# Patient Record
Sex: Female | Born: 1965
Health system: Southern US, Community
[De-identification: ages and names within clinical notes are randomized; demographics above are authoritative.]

## PROBLEM LIST (undated history)

## (undated) DIAGNOSIS — S82899A Other fracture of unspecified lower leg, initial encounter for closed fracture: Secondary | ICD-10-CM

## (undated) DIAGNOSIS — Z9089 Acquired absence of other organs: Secondary | ICD-10-CM

## (undated) DIAGNOSIS — F419 Anxiety disorder, unspecified: Secondary | ICD-10-CM

## (undated) DIAGNOSIS — H3552 Pigmentary retinal dystrophy: Secondary | ICD-10-CM

## (undated) DIAGNOSIS — K219 Gastro-esophageal reflux disease without esophagitis: Secondary | ICD-10-CM

## (undated) DIAGNOSIS — E039 Hypothyroidism, unspecified: Secondary | ICD-10-CM

## (undated) DIAGNOSIS — L719 Rosacea, unspecified: Secondary | ICD-10-CM

## (undated) DIAGNOSIS — R1310 Dysphagia, unspecified: Secondary | ICD-10-CM

## (undated) DIAGNOSIS — I341 Nonrheumatic mitral (valve) prolapse: Secondary | ICD-10-CM

## (undated) DIAGNOSIS — O039 Complete or unspecified spontaneous abortion without complication: Secondary | ICD-10-CM

## (undated) DIAGNOSIS — E049 Nontoxic goiter, unspecified: Secondary | ICD-10-CM

## (undated) DIAGNOSIS — F329 Major depressive disorder, single episode, unspecified: Secondary | ICD-10-CM

## (undated) DIAGNOSIS — I10 Essential (primary) hypertension: Secondary | ICD-10-CM

## (undated) DIAGNOSIS — F32A Depression, unspecified: Secondary | ICD-10-CM

## (undated) DIAGNOSIS — J302 Other seasonal allergic rhinitis: Secondary | ICD-10-CM

## (undated) HISTORY — PX: SEPTOPLASTY: SUR1290

## (undated) HISTORY — PX: WISDOM TOOTH EXTRACTION: SHX21

## (undated) HISTORY — DX: Other fracture of unspecified lower leg, initial encounter for closed fracture: S82.899A

## (undated) HISTORY — DX: Complete or unspecified spontaneous abortion without complication: O03.9

## (undated) HISTORY — PX: OTHER SURGICAL HISTORY: SHX169

## (undated) HISTORY — PX: DILATION AND CURETTAGE OF UTERUS: SHX78

## (undated) HISTORY — DX: Acquired absence of other organs: Z90.89

## (undated) HISTORY — PX: COLONOSCOPY: SHX174

## (undated) HISTORY — PX: TONSILLECTOMY: SUR1361

## (undated) HISTORY — PX: FRACTURE SURGERY: SHX138

## (undated) HISTORY — PX: ABDOMINAL HYSTERECTOMY: SHX81

---

## 2000-07-09 ENCOUNTER — Other Ambulatory Visit: Admission: RE | Admit: 2000-07-09 | Discharge: 2000-07-09 | Payer: Self-pay | Admitting: Family Medicine

## 2000-07-17 ENCOUNTER — Encounter: Payer: Self-pay | Admitting: Family Medicine

## 2000-07-17 ENCOUNTER — Encounter: Admission: RE | Admit: 2000-07-17 | Discharge: 2000-07-17 | Payer: Self-pay | Admitting: Family Medicine

## 2001-01-09 ENCOUNTER — Encounter: Payer: Self-pay | Admitting: Family Medicine

## 2001-01-09 ENCOUNTER — Encounter: Admission: RE | Admit: 2001-01-09 | Discharge: 2001-01-09 | Payer: Self-pay | Admitting: Family Medicine

## 2001-08-12 ENCOUNTER — Other Ambulatory Visit: Admission: RE | Admit: 2001-08-12 | Discharge: 2001-08-12 | Payer: Self-pay | Admitting: *Deleted

## 2002-10-15 ENCOUNTER — Other Ambulatory Visit: Admission: RE | Admit: 2002-10-15 | Discharge: 2002-10-15 | Payer: Self-pay | Admitting: Family Medicine

## 2003-11-25 ENCOUNTER — Other Ambulatory Visit: Admission: RE | Admit: 2003-11-25 | Discharge: 2003-11-25 | Payer: Self-pay | Admitting: Family Medicine

## 2004-09-21 ENCOUNTER — Encounter: Admission: RE | Admit: 2004-09-21 | Discharge: 2004-09-21 | Payer: Self-pay | Admitting: Family Medicine

## 2005-01-30 ENCOUNTER — Inpatient Hospital Stay (HOSPITAL_COMMUNITY): Admission: AD | Admit: 2005-01-30 | Discharge: 2005-01-30 | Payer: Self-pay | Admitting: Obstetrics & Gynecology

## 2005-03-10 ENCOUNTER — Encounter (INDEPENDENT_AMBULATORY_CARE_PROVIDER_SITE_OTHER): Payer: Self-pay | Admitting: Specialist

## 2005-03-10 ENCOUNTER — Ambulatory Visit (HOSPITAL_COMMUNITY): Admission: RE | Admit: 2005-03-10 | Discharge: 2005-03-10 | Payer: Self-pay | Admitting: *Deleted

## 2006-01-02 ENCOUNTER — Other Ambulatory Visit: Admission: RE | Admit: 2006-01-02 | Discharge: 2006-01-02 | Payer: Self-pay | Admitting: Gynecology

## 2006-06-07 ENCOUNTER — Ambulatory Visit: Payer: Self-pay | Admitting: Neonatology

## 2006-06-07 ENCOUNTER — Inpatient Hospital Stay (HOSPITAL_COMMUNITY): Admission: AD | Admit: 2006-06-07 | Discharge: 2006-06-10 | Payer: Self-pay | Admitting: Gynecology

## 2006-06-21 ENCOUNTER — Encounter: Admission: RE | Admit: 2006-06-21 | Discharge: 2006-07-14 | Payer: Self-pay | Admitting: Gynecology

## 2006-07-18 ENCOUNTER — Other Ambulatory Visit: Admission: RE | Admit: 2006-07-18 | Discharge: 2006-07-18 | Payer: Self-pay | Admitting: Gynecology

## 2009-02-19 ENCOUNTER — Other Ambulatory Visit: Admission: RE | Admit: 2009-02-19 | Discharge: 2009-02-19 | Payer: Self-pay | Admitting: Family Medicine

## 2010-01-21 ENCOUNTER — Emergency Department (HOSPITAL_COMMUNITY): Admission: EM | Admit: 2010-01-21 | Discharge: 2010-01-21 | Payer: Self-pay | Admitting: Emergency Medicine

## 2010-01-25 ENCOUNTER — Ambulatory Visit (HOSPITAL_BASED_OUTPATIENT_CLINIC_OR_DEPARTMENT_OTHER): Admission: RE | Admit: 2010-01-25 | Discharge: 2010-01-25 | Payer: Self-pay | Admitting: Orthopaedic Surgery

## 2010-05-23 ENCOUNTER — Encounter: Admission: RE | Admit: 2010-05-23 | Discharge: 2010-05-23 | Payer: Self-pay | Admitting: Family Medicine

## 2010-06-22 ENCOUNTER — Other Ambulatory Visit: Admission: RE | Admit: 2010-06-22 | Discharge: 2010-06-22 | Payer: Self-pay | Admitting: Interventional Radiology

## 2010-06-22 ENCOUNTER — Encounter: Admission: RE | Admit: 2010-06-22 | Discharge: 2010-06-22 | Payer: Self-pay | Admitting: Internal Medicine

## 2010-11-21 LAB — POCT HEMOGLOBIN-HEMACUE: Hemoglobin: 13.1 g/dL (ref 12.0–15.0)

## 2010-11-22 ENCOUNTER — Ambulatory Visit
Admission: RE | Admit: 2010-11-22 | Discharge: 2010-11-22 | Disposition: A | Discharge status: Home / Self Care | Payer: Medicare Other | Source: Ambulatory Visit | Attending: Internal Medicine | Admitting: Internal Medicine

## 2010-11-22 ENCOUNTER — Other Ambulatory Visit: Payer: Self-pay | Admitting: Internal Medicine

## 2010-11-22 DIAGNOSIS — R221 Localized swelling, mass and lump, neck: Secondary | ICD-10-CM

## 2011-01-19 ENCOUNTER — Other Ambulatory Visit (HOSPITAL_COMMUNITY): Payer: Self-pay | Admitting: Surgery

## 2011-01-19 DIAGNOSIS — R52 Pain, unspecified: Secondary | ICD-10-CM

## 2011-01-20 NOTE — Discharge Summary (Signed)
Alyssa Fisher, Alyssa Fisher                  ACCOUNT NO.:  [id number]   MEDICAL RECORD NO.:  [id number]          PATIENT TYPE:  INP   LOCATION:  9311                          [facility]ILITY:  [facility]   PHYSICIAN:  Timothy P. Fontaine, M.D.DATE OF BIRTH:  01/13/1966   DATE OF ADMISSION:  06/07/2006  DATE OF DISCHARGE:  06/10/2006                                 DISCHARGE SUMMARY   DISCHARGE DIAGNOSES:  1. Pregnancy at 33 weeks, delivered.  2. Intrauterine growth restriction.  3. Oligohydramnios.  4. Abnormal Doppler flow.  5. Labile blood pressure.  6. Questionable preterm rupture of the membranes.   PROCEDURE:  Spontaneous vaginal delivery.  Normal female infant.  Apgars 9 at  nine, June 08, 2006.  Dr. Reynaldo Minium.  Pathology pending.   HOSPITAL COURSE:  [age]-year-old G6, P4 female, 64 weeks' gestation.  Admitted with labile blood pressures, 130/90s, 140/80s.  symmetrical IUGR.  Oligohydramnios.  Questionable leaking for 1 week.  Abnormal Doppler flow  studies.  Cervical exam 3 cm dilated.  Bloody show.  Perinatology consult  obtained.  Agree with proceeding with the delivery at this time.  The  patient subsequently received Pitocin augmentation, beta strep antibody  prophylaxis.  Underwent vaginal delivery producing a normal-appearing female  infant.  Apgars 9 at nine.  The placenta was noted to be small with a true  knot noted within the umbilical cord.  She had no vaginal lacerations or  tears, nor episiotomy.  Her postpartum course was uncomplicated and she was  discharged on postpartum day number 2, ambulating well, tolerating a regular  diet, with a post-delivery hemoglobin of 11.8.  Patient's blood type is A-  positive and her rubella titer is positive.  Patient's blood pressures after  delivery were running in the 116 to 130/80 range.  She was given  precautions, instructions, and followup.  Will be seen in the office in 6  weeks following discharge.  Was given a prescription for Tylox  #20 one to 2  p.o. q.4 to 6 hours p.r.n. pain.      Timothy P. Fontaine, M.D.  Electronically Signed     TPF/MEDQ  D:  06/10/2006  T:  06/11/2006  Job:  [id number]

## 2011-01-20 NOTE — H&P (Signed)
Alyssa Fisher, GRALL                  ACCOUNT NO.:  [id number]   MEDICAL RECORD NO.:  [id number]          PATIENT TYPE:  INP   LOCATION:  9311                          [facility]   PHYSICIAN:  Juan H. Lily Peer, M.D.DATE OF BIRTH:  [birth year]   DATE OF ADMISSION:  DATE OF DISCHARGE:                                HISTORY & PHYSICAL   CHIEF COMPLAINT:  1. Pregnancy-induced hypertension.  2. Severe oligohydramnios.  3. Symmetrical IUGR.   HISTORY:  The patient is a 45 year old, gravida 6, para 4, AB 1, who  presented to the office today for followup visit as a result of the patient  having labile PIH, which started to be noticed at approximately 31 weeks'  gestation.  She had been closely monitored with weekly office visits.  Her  activity was restricted with modified activity and bed rest.  Today, her  blood pressures were 130/94, 142/84, 128/80.  Her fundal height measurement  was at approximately 30 cm and she is currently 33-1/2 weeks' gestation.  The patient underwent nonstress test and had a reassuring fetal heart rate  tracing.  A urinalysis was done, which demonstrated no evidence of  proteinuria.  Her CBC:  Her hemoglobin was 11, hematocrit 34.8, and her  platelet count was 167,000.  A PIH panel was done, which demonstrated normal  SGOT, SGPT, LDH, and uric acid.  She subsequently underwent an ultrasound  with a biophysical profile and Doppler flow studies.  Her biophysical  profile was recorded at 4/8 (2 for breathing and aminotic fluid volume of  2).  There was no tone or movement seen.  Doppler flow studies  systolic/diastolic ratios were normal at 3.08, normal is less than 3.70, and  her pulsatility index (MCA) was low at 0.98, normal being greater than 1.40.  Measurements demonstrated that symmetrical IUGR was evident with BPD at 29  weeks' gestation, head circumference at 32, abdominal circumference at 26,  and femur length at 27.  The fetus was in the vertex  presentation.  The  patient also had stated that over the past week she had felt that her  panties were moist and upon placement of a sterile speculum exam, there was  bloody show and her cervix was found to be 3 cm dilated.  Ferning was  difficult to assess due to the bloody show.  The patient will be admitted to  [facility].  We will proceed with a CST and plan on a vaginal  delivery/induction.  I have discussed the case with Dr. Margot Ables, [facility]  perinatologist, who was in agreement as well.  Prenatal course prior to that  point had been uneventful.  The patient had been offered prenatal screening  such as first trimester screen, maternal serum alpha fetoprotein and cystic  fibrosis, and/or amniocentesis, and she had declined.  She is requesting an  immediate postpartum tubal sterilization.  She does have history of mitral  valve prolapse.  Her weight gain since her first visit has been 26 pounds.   PAST MEDICAL HISTORY:  The patient is ALLERGIC TO CODEINE.  She  has had 4  vaginal deliveries in the past, all at 37 to 38 weeks with the except of her  first pregnancy she delivered at 36 weeks', and average weights have been  between 5 and 6 pounds.  The patient denied smoking or alcohol consumption.  She is currently on prenatal vitamins and has a history of retinitis  pigmentosum, and she has had a prior history of tonsillectomy.   REVIEW OF SYSTEMS:  See [place] form.   PHYSICAL EXAMINATION:  Well-developed, well-nourished female.  Blood pressure 130/94, 142/84, 128/80.  HEENT:  Unremarkable.  NECK:  Supple.  Trachea midline. No carotid bruits.  No thyromegaly.  LUNGS:  Clear to auscultation without rhonchi or wheezes.  HEART:  Regular rate and rhythm.  No murmurs or gallops.  BREASTS:  Not done.  ABDOMEN:  Fundal height 30+ cm, vertex.  EXTREMITIES:  DTR 2+ to 3+ with one-beat clonus and 1+ plus pitting edema.  PELVIC:  Cervix 3 cm, 80% effaced, -3, bloody show.    PRENATAL LABS:  A-positive blood type, negative antibody screen, VDRL was  nonreactive, rubella immune.  Hepatitis B surface antigen and HIV were  negative.  Diabetes screens have been normal.  The patient declined cystic  fibrosis screening, amniocentesis, first trimester screen, and alpha  fetoprotein screening.   ASSESSMENT:  A 45 year old gravida 6, para 4, AB 1 at 33-1/2 weeks'  estimated gestational age with evidence of symmetrical IUGR and severe  oligohydramnios as well as abnormal Doppler flow studies with a low MCA but  normal systolic/diastolic ratios.  Biophysical profile was 4/8 and the  nonstress test was reassuring.  Case discussed with Dr. Margot Ables,  perinatologist, who agreed with the above management.  In the event of any  fetal distress, we will proceed with an emergency cesarean section.  Patient  with history of mitral valve prolapse.  We will give also penicillin-G as  well for group-B streptococcus prophylaxis.  We had a lengthy discussion  with the above-mentioned findings that the fetus would be best served in an  extrauterine environment instead of  in utero environment despite being  premature.  The patient is fully aware that the baby needs to be placed  perhaps in the neonatal intensive care unit for resuscitation and treatment.  We may want to hold off on the tubal sterilization procedure until a later  time due to this being an emotional time with these recent findings and  decisions.  All of these issues were discussed in detail.  Dr. Margot Ables will  come by and speak with the patient.  I have also notified the neonatologist  on call, which will speak to her as well.   PLAN:  As per assessment above.      Juan H. Lily Peer, M.D.  Electronically Signed     JHF/MEDQ  D:  06/07/2006  T:  06/07/2006  Job:  [id number]

## 2011-01-20 NOTE — Consult Note (Signed)
Alyssa Fisher, BLACKSHER NO.:  [id number]   MEDICAL RECORD NO.:  [id number]          PATIENT TYPE:  MAT   LOCATION:  MATC                          [facility]  [facility]   PHYSICIAN:  Lenoard Aden, M.D.DATE OF BIRTH:  [birth year]   DATE OF CONSULTATION:  01/30/2005  DATE OF DISCHARGE:                                   CONSULTATION   CHIEF COMPLAINT:  First trimester bleeding.   HISTORY OF PRESENT ILLNESS:  The patient is a 45 year old white female, G5,  P3-1-0-4, at 5-1/[redacted] weeks gestation, who presents with bleeding post  intercourse.   PAST MEDICAL HISTORY:  Remarkable for four spontaneous vaginal deliveries.  No other medical or surgical hospitalizations.   MEDICATIONS:  Prenatal vitamins.   ALLERGIES:  She has no known drug allergies.   FAMILY HISTORY:  Noncontributory.   SOCIAL HISTORY:  Noncontributory.   PHYSICAL EXAMINATION:  VITAL SIGNS:  Within normal limits.  Pulse 100,  respirations 18, blood pressure 129/83.  HEENT:  Normal.  LUNGS:  Clear.  HEART:  Regular rate and rhythm.  ABDOMEN:  Soft, gravid and nontender.  PELVIC:  Cervix closed.  The uterus is appropriately sized.  Ultrasound  reveals a 5-1/2 week intrauterine gestation with fetal heart tones of 99  beats per minute.  Bilateral normal ovaries noted.  EXTREMITIES:  No cords.  NEUROLOGIC:  Exam is nonfocal.   IMPRESSION:  Threatened abortion at 5-1/2 weeks with fetal bradycardia.   PLAN:  Threatened AB warnings given.  Pelvic rest discussed.  Follow up in  the office in one week.  Repeat ultrasound at next visit.      RJT/MEDQ  D:  01/30/2005  T:  01/30/2005  Job:  [id number]   cc:   [facility]

## 2011-01-20 NOTE — Op Note (Signed)
Alyssa Fisher, Alyssa Fisher                   ACCOUNT NO.:  [id number]   MEDICAL RECORD NO.:  [id number]          PATIENT TYPE:  AMB   LOCATION:  SDC                           [facility]   PHYSICIAN:  [place] B. Earlene Plater, M.D.  DATE OF BIRTH:  November 03, 1965   DATE OF PROCEDURE:  03/10/2005  DATE OF DISCHARGE:                                 OPERATIVE REPORT   PREOPERATIVE DIAGNOSIS:  Nine week missed abortion.   POSTOPERATIVE DIAGNOSIS:  Nine week missed abortion.   PROCEDURE:  Suction curettage, ultrasound guided.   SURGEON:  Chester Holstein. Earlene Plater, M.D.   ASSISTANT:  None.   ANESTHESIA:  MAC and 20 mL 1% Nescaine paracervical block.   SPECIMENS:  Products of conception.   BLOOD LOSS:  Minimal.   COMPLICATIONS:  None.   INDICATIONS:  The patient is 11 weeks by dates.  No fetal heart tones on  routine visit 2 days ago.  Ultrasound showed nine-week missed abortion.  Desires operative management.  Risks discussed including infection,  bleeding, uterine perforation, damage to surrounding organs.  Alternative of  expectant management also discussed.   PROCEDURE IN DETAIL:  The patient taken to the operating room and MAC  anesthesia obtained.  She was placed in the Elliott stirrups and prepped and  draped in the standard fashion.  The bladder emptied with in-and-out.  Exam  under anesthesia showed a 9-10 week anteverted uterus.  The speculum  inserted, paracervical block placed.  The cervix was dilated to a #23.  The  #8 suction cannula was inserted.  With several passes, the uterus was  cleared, endometrium curetted, and a gritty texture noted.  Suction cannula  passed a second time and no additional products were returned.  Ultrasound  used and showed a thin endometrial stripe confirming complete evacuation.  The single tooth was removed and cervix hemostatic.  The patient tolerated  the procedure without complications.  She was taken to the recovery room in  stable condition.  She is noted to be  A positive from the office.       WBD/MEDQ  D:  03/10/2005  T:  03/10/2005  Job:  [id number]

## 2011-01-24 ENCOUNTER — Ambulatory Visit (HOSPITAL_COMMUNITY)
Admission: RE | Admit: 2011-01-24 | Discharge: 2011-01-24 | Disposition: A | Discharge status: Home / Self Care | Payer: Medicare Other | Source: Ambulatory Visit | Attending: Surgery | Admitting: Surgery

## 2011-01-24 ENCOUNTER — Other Ambulatory Visit (HOSPITAL_COMMUNITY): Payer: Self-pay | Admitting: Internal Medicine

## 2011-01-24 DIAGNOSIS — R52 Pain, unspecified: Secondary | ICD-10-CM

## 2011-01-24 DIAGNOSIS — R1319 Other dysphagia: Secondary | ICD-10-CM | POA: Insufficient documentation

## 2011-01-31 ENCOUNTER — Ambulatory Visit (INDEPENDENT_AMBULATORY_CARE_PROVIDER_SITE_OTHER): Payer: Medicare Other | Admitting: Internal Medicine

## 2011-01-31 DIAGNOSIS — R0609 Other forms of dyspnea: Secondary | ICD-10-CM

## 2011-01-31 DIAGNOSIS — R0989 Other specified symptoms and signs involving the circulatory and respiratory systems: Secondary | ICD-10-CM

## 2011-01-31 LAB — PULMONARY FUNCTION TEST

## 2011-01-31 NOTE — Progress Notes (Signed)
PFT done today. 

## 2011-02-07 ENCOUNTER — Encounter: Payer: Self-pay | Admitting: Internal Medicine

## 2011-02-20 ENCOUNTER — Encounter (INDEPENDENT_AMBULATORY_CARE_PROVIDER_SITE_OTHER): Payer: Self-pay | Admitting: Surgery

## 2011-02-20 DIAGNOSIS — R21 Rash and other nonspecific skin eruption: Secondary | ICD-10-CM

## 2011-02-20 DIAGNOSIS — E079 Disorder of thyroid, unspecified: Secondary | ICD-10-CM

## 2011-02-20 DIAGNOSIS — R42 Dizziness and giddiness: Secondary | ICD-10-CM

## 2011-02-20 DIAGNOSIS — R5383 Other fatigue: Secondary | ICD-10-CM

## 2011-02-20 DIAGNOSIS — R61 Generalized hyperhidrosis: Secondary | ICD-10-CM | POA: Insufficient documentation

## 2011-02-20 DIAGNOSIS — J349 Unspecified disorder of nose and nasal sinuses: Secondary | ICD-10-CM

## 2011-02-20 DIAGNOSIS — Z8489 Family history of other specified conditions: Secondary | ICD-10-CM

## 2011-02-20 DIAGNOSIS — Z973 Presence of spectacles and contact lenses: Secondary | ICD-10-CM | POA: Insufficient documentation

## 2011-02-20 DIAGNOSIS — R519 Headache, unspecified: Secondary | ICD-10-CM

## 2011-02-20 DIAGNOSIS — I341 Nonrheumatic mitral (valve) prolapse: Secondary | ICD-10-CM | POA: Insufficient documentation

## 2011-02-20 DIAGNOSIS — M1990 Unspecified osteoarthritis, unspecified site: Secondary | ICD-10-CM | POA: Insufficient documentation

## 2011-02-20 DIAGNOSIS — H3552 Pigmentary retinal dystrophy: Secondary | ICD-10-CM

## 2011-02-20 DIAGNOSIS — Z803 Family history of malignant neoplasm of breast: Secondary | ICD-10-CM | POA: Insufficient documentation

## 2011-02-20 DIAGNOSIS — R0989 Other specified symptoms and signs involving the circulatory and respiratory systems: Secondary | ICD-10-CM

## 2011-02-20 DIAGNOSIS — E042 Nontoxic multinodular goiter: Secondary | ICD-10-CM | POA: Insufficient documentation

## 2011-02-20 DIAGNOSIS — R490 Dysphonia: Secondary | ICD-10-CM

## 2011-02-20 HISTORY — DX: Family history of other specified conditions: Z84.89

## 2011-02-20 HISTORY — DX: Headache, unspecified: R51.9

## 2011-02-20 HISTORY — DX: Generalized hyperhidrosis: R61

## 2011-02-20 HISTORY — DX: Presence of spectacles and contact lenses: Z97.3

## 2011-02-20 HISTORY — DX: Dizziness and giddiness: R42

## 2011-02-20 HISTORY — DX: Rash and other nonspecific skin eruption: R21

## 2011-02-20 HISTORY — DX: Unspecified disorder of nose and nasal sinuses: J34.9

## 2011-02-20 HISTORY — DX: Other fatigue: R53.83

## 2011-02-20 HISTORY — DX: Dysphonia: R49.0

## 2011-02-20 HISTORY — DX: Other specified symptoms and signs involving the circulatory and respiratory systems: R09.89

## 2011-02-23 ENCOUNTER — Encounter (INDEPENDENT_AMBULATORY_CARE_PROVIDER_SITE_OTHER): Payer: Self-pay | Admitting: Surgery

## 2011-03-01 ENCOUNTER — Encounter (INDEPENDENT_AMBULATORY_CARE_PROVIDER_SITE_OTHER): Payer: Self-pay | Admitting: Surgery

## 2011-03-01 ENCOUNTER — Ambulatory Visit (INDEPENDENT_AMBULATORY_CARE_PROVIDER_SITE_OTHER): Payer: Medicare Other | Admitting: Surgery

## 2011-03-01 DIAGNOSIS — R1310 Dysphagia, unspecified: Secondary | ICD-10-CM | POA: Insufficient documentation

## 2011-03-01 DIAGNOSIS — E042 Nontoxic multinodular goiter: Secondary | ICD-10-CM

## 2011-03-01 NOTE — Progress Notes (Signed)
History: Patient returns for followup of thyroid nodules and review of results from speech pathology swallowing evaluation and pulmonary function test. Patient had a swallowing evaluation which showed hypertrophic upper esophagus consistent with gastroesophageal reflux as the etiology of her globus sensation and dysphagia. Pulmonary function test results are provided but there is no interpretation yet from the pulmonologist.  Pertinent review of systems: Patient noted slight improvement in her dysphagia with initiation of proton pump inhibitors, no change in dyspnea  Exam: HEENT: Normocephalic, sclerae clear, pupils equal and reactive, dentition good, mucous membranes moist, voiced normal Neck: Symmetric on anterior examination. Left thyroid nodule approximately 2 cm in size on palpation, mildly tender Chest: Clear clear to auscultation Cardiac: Regular rate and rhythm without murmur Extremities: No edema  Assessment: #1-bilateral thyroid nodules, less than 2 cm in maximum dimension, benign cytopathology #2-dysphagia likely secondary to gastroesophageal reflux #3-dyspnea with exercise of undetermined etiology  Plan: Patient will schedule followup with Dr. Talmage Coin the next few months. She will return to see me in 6 months with a repeat thyroid ultrasound agreement imaging prior to her office visit.  Patient will continue followup with her primary care provider regarding gastroesophageal reflux. She may require referral to gastroenterology if symptoms do not improve quickly.  I will contact Dr. Marcelyn Bruins regarding the pulmonary function test. I will ask him whether the patient requires pulmonary consultation.

## 2011-03-02 ENCOUNTER — Other Ambulatory Visit (INDEPENDENT_AMBULATORY_CARE_PROVIDER_SITE_OTHER): Payer: Self-pay | Admitting: Surgery

## 2011-03-02 DIAGNOSIS — E042 Nontoxic multinodular goiter: Secondary | ICD-10-CM

## 2011-04-13 ENCOUNTER — Telehealth (INDEPENDENT_AMBULATORY_CARE_PROVIDER_SITE_OTHER): Payer: Self-pay

## 2011-04-13 NOTE — Telephone Encounter (Signed)
Robbin: She will have to get PFT results from [place].  I do not know how to interpret them. Also, get a final report from them for me to review as well. Thanks, TMG

## 2011-04-13 NOTE — Telephone Encounter (Signed)
Patient called wanting an interpretation of PFT results. I told patient the request will be forward to Dr. Gerrit Friends.  However she may consider calling [place] where the test was performed.  If need more help she is welcome to call back. RMP

## 2011-04-16 IMAGING — CR DG ANKLE COMPLETE 3+V*L*
3 series · 3 of 3 positions shown · non-contrast
Comparison: None.

CLINICAL DATA: Foot injury status post fall.

LEFT ANKLE COMPLETE - 3+ VIEW

[t ankle joint ap left]
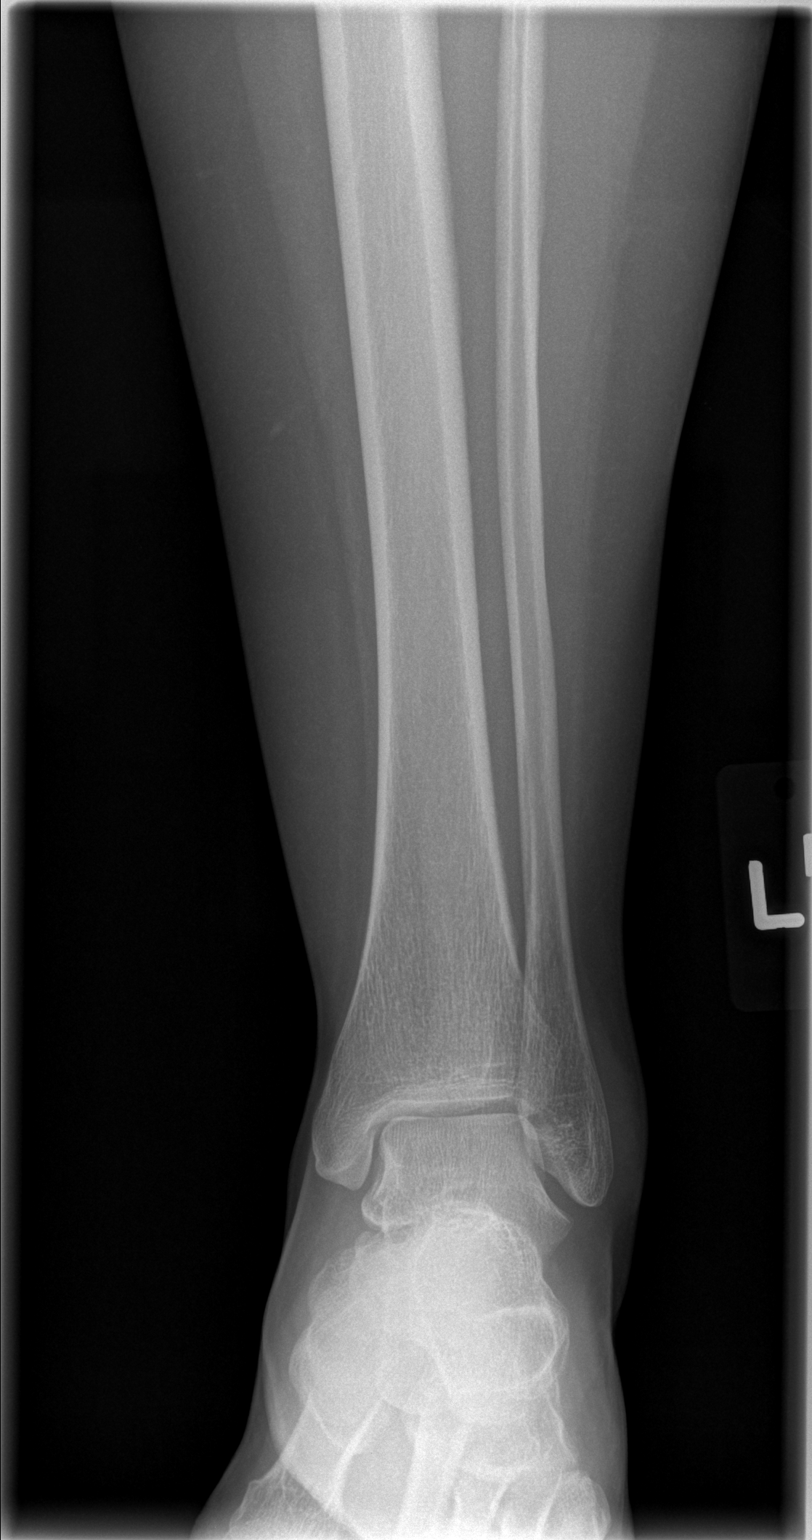

[t ankle joint oblique left]
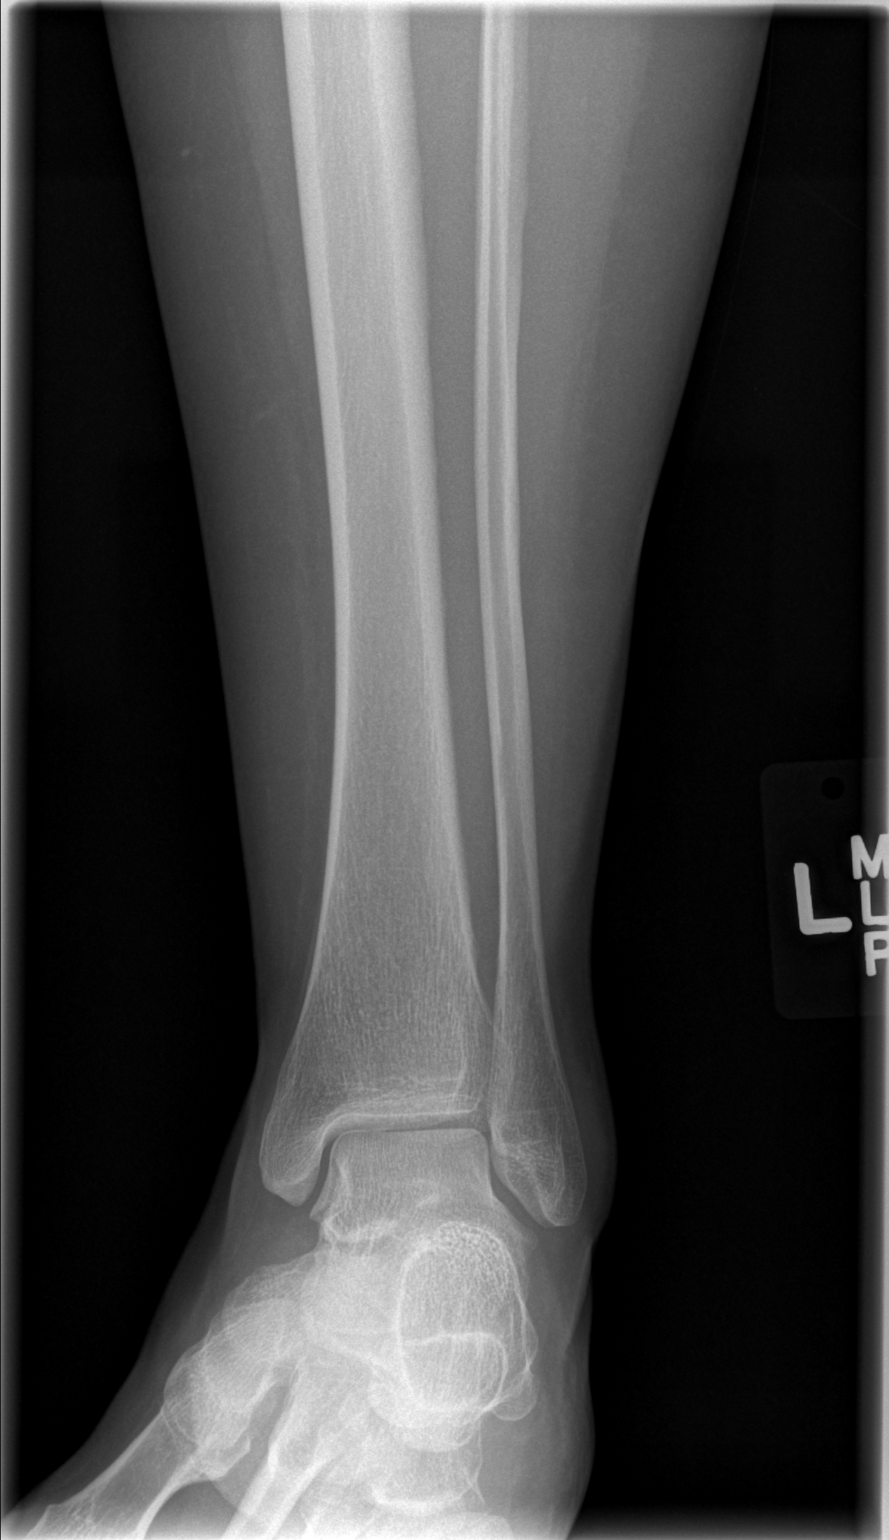

[view not recorded]
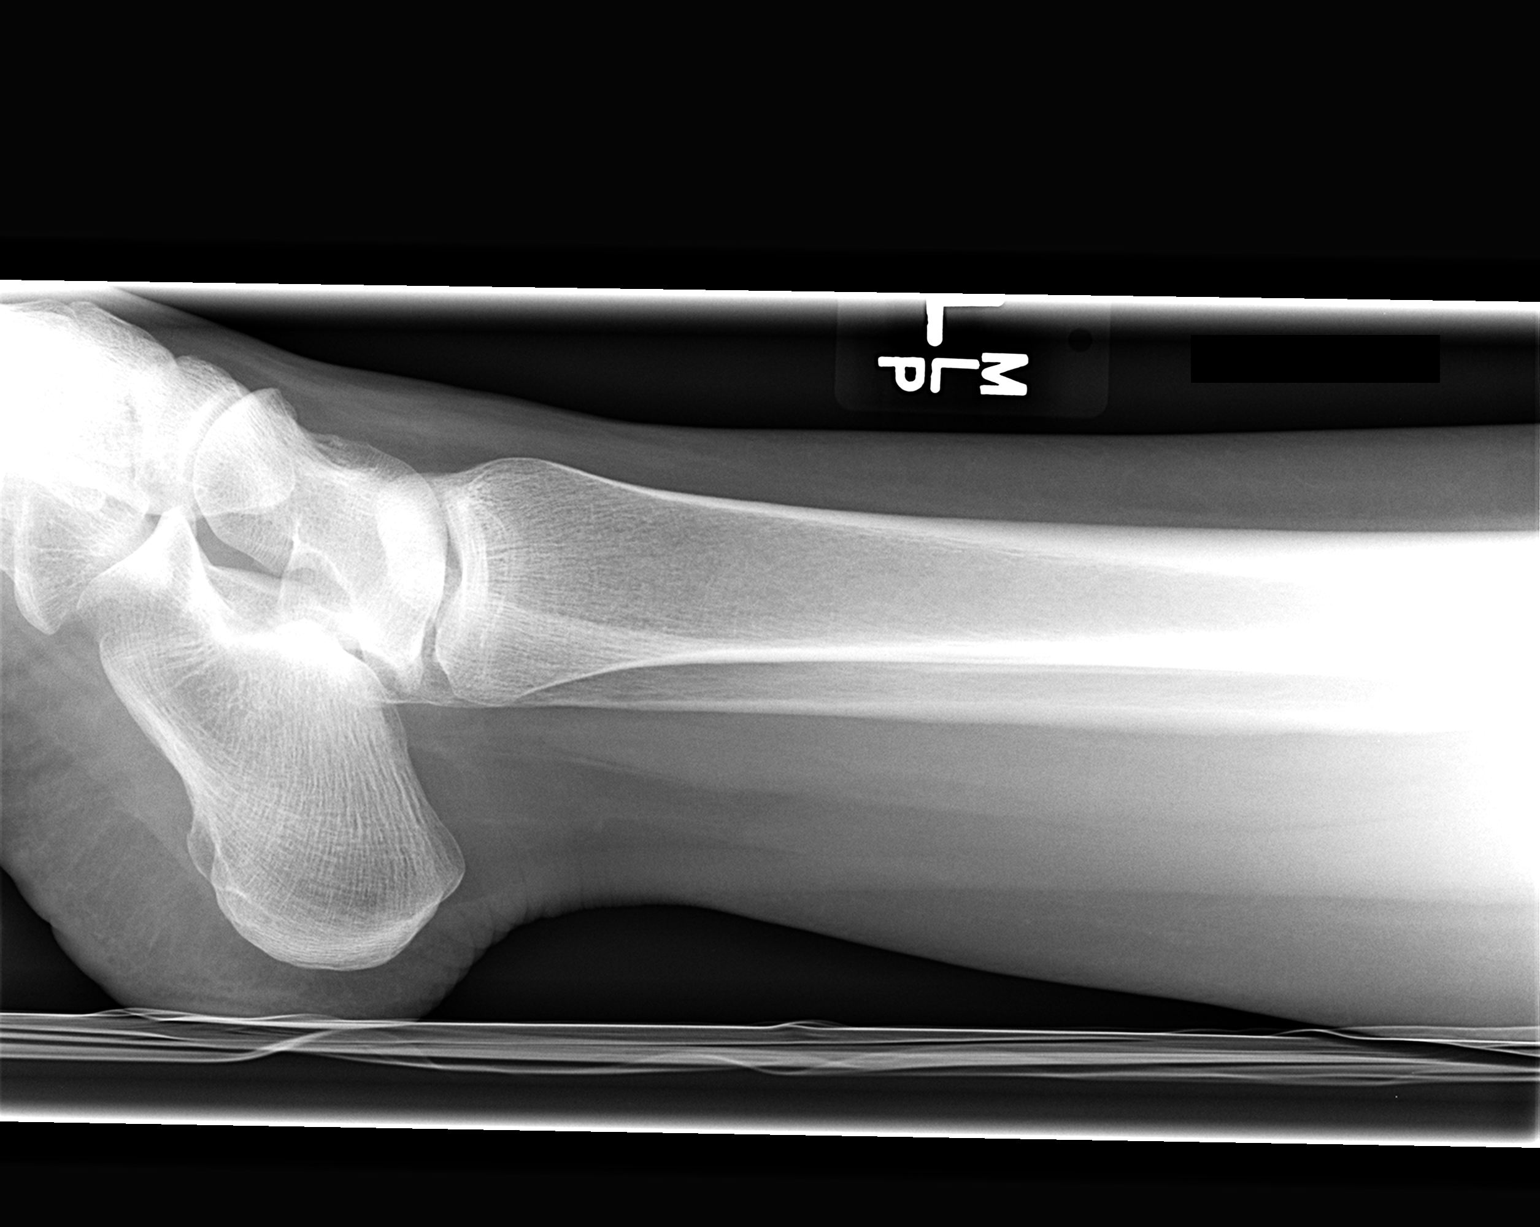

[3 of 3 positions shown; findings below may reference images not displayed]

FINDINGS: Mild soft tissue swelling lateral malleolar region.  No
underlying fracture or dislocation.
IMPRESSION: No fracture or dislocation.

Mild soft tissue swelling lateral malleolar region.

## 2011-04-19 ENCOUNTER — Telehealth: Payer: Self-pay | Admitting: Internal Medicine

## 2011-04-19 ENCOUNTER — Telehealth (INDEPENDENT_AMBULATORY_CARE_PROVIDER_SITE_OTHER): Payer: Self-pay | Admitting: General Surgery

## 2011-04-19 ENCOUNTER — Telehealth (INDEPENDENT_AMBULATORY_CARE_PROVIDER_SITE_OTHER): Payer: Self-pay

## 2011-04-19 ENCOUNTER — Telehealth: Payer: Self-pay | Admitting: Surgery

## 2011-04-19 NOTE — Telephone Encounter (Signed)
ATC Dr. Ardine Eng office to get fax # where she would like PFT's faxed to.  Office is closed for the day.  WCB tomorrow.

## 2011-04-19 NOTE — Telephone Encounter (Signed)
I called for PFT final summary to be faxed.  Alyssa Fisher has also requested a copy.  Message will be forward to Tammy at [place].

## 2011-04-19 NOTE — Telephone Encounter (Signed)
Error.  Triage took phone call.  Alyssa Fisher

## 2011-04-20 ENCOUNTER — Other Ambulatory Visit (INDEPENDENT_AMBULATORY_CARE_PROVIDER_SITE_OTHER): Payer: Self-pay | Admitting: Surgery

## 2011-04-20 DIAGNOSIS — E042 Nontoxic multinodular goiter: Secondary | ICD-10-CM

## 2011-04-20 NOTE — Telephone Encounter (Signed)
Faxed pft reuslts to the attention of robin the nurse at [facility]--per the front desk they did receive the fax and they will call the pt with the results since they ordered the test

## 2011-09-01 ENCOUNTER — Other Ambulatory Visit: Payer: Medicare Other

## 2011-09-01 ENCOUNTER — Ambulatory Visit
Admission: RE | Admit: 2011-09-01 | Discharge: 2011-09-01 | Disposition: A | Discharge status: Home / Self Care | Payer: Medicare Other | Source: Ambulatory Visit | Attending: Surgery | Admitting: Surgery

## 2011-09-01 DIAGNOSIS — E042 Nontoxic multinodular goiter: Secondary | ICD-10-CM

## 2011-09-15 IMAGING — US US BIOPSY
1 series · 14 of 18 positions shown · non-contrast
Comparison: none

Clinical Data/Indication: Bilateral thyroid nodules

Ultrasound-guided biopsyof bilateral thyroid nodules.  Fine needle
aspiration.
Procedure: The procedure, risks, benefits, and alternatives were
explained to the patient. Questions regarding the procedure were
encouraged and answered. The patient understands and consents to
the procedure.
The neck was prepped with betadine in a sterile fashion, and a
sterile drape was applied covering the operative field.
Under sonographic guidance, three 25 gauge fine needle aspirates of
the dominant right isthmic nodule were obtained.  Subsequently,
three per can through 25 gauge fine needle aspirates of the
dominant left isthmic nodule were obtained. Final imaging was
performed.

[Series 1: us biopsy · 0.07mm/px · 18 acquisitions, 14 frames shown]
[im 1/18]
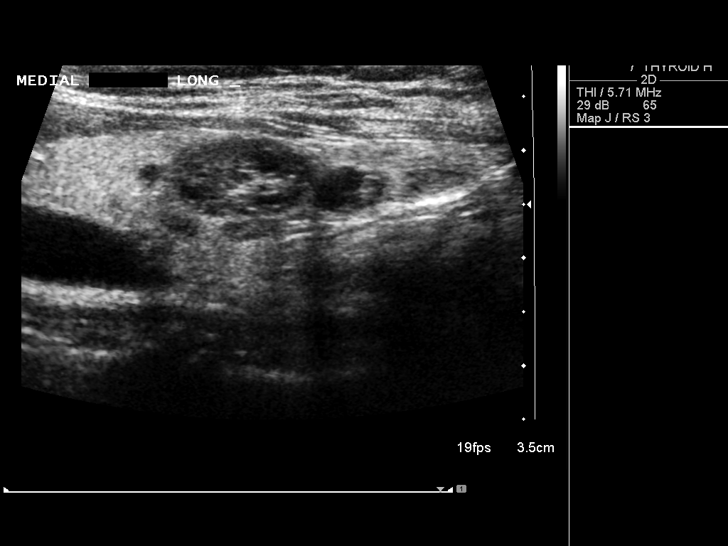
[im 2/18]
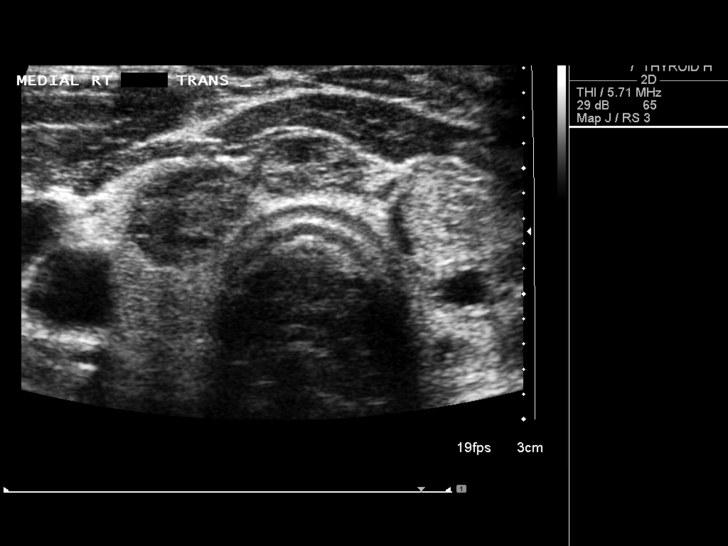
[im 4/18]
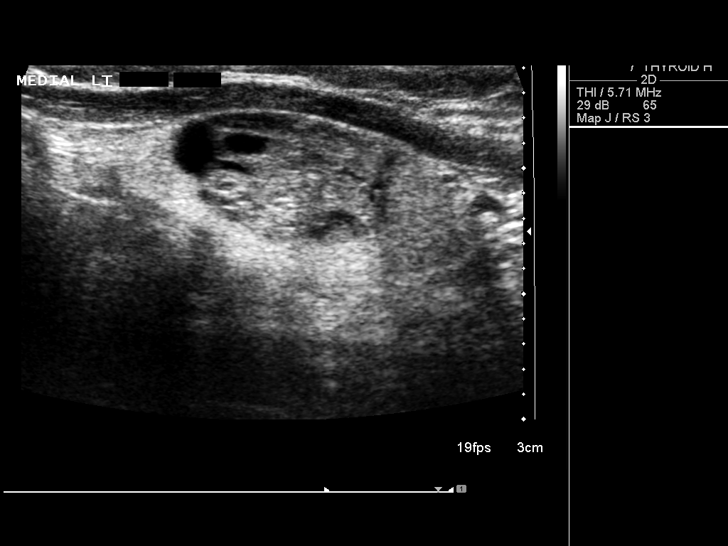
[im 5/18]
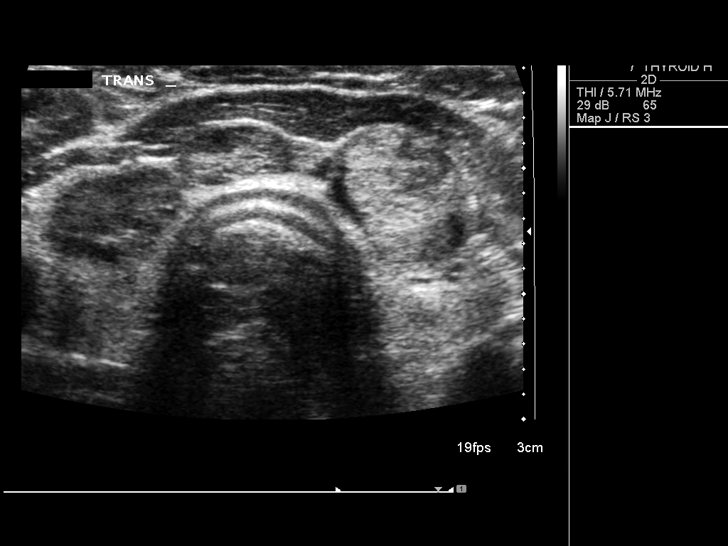
[im 6/18]
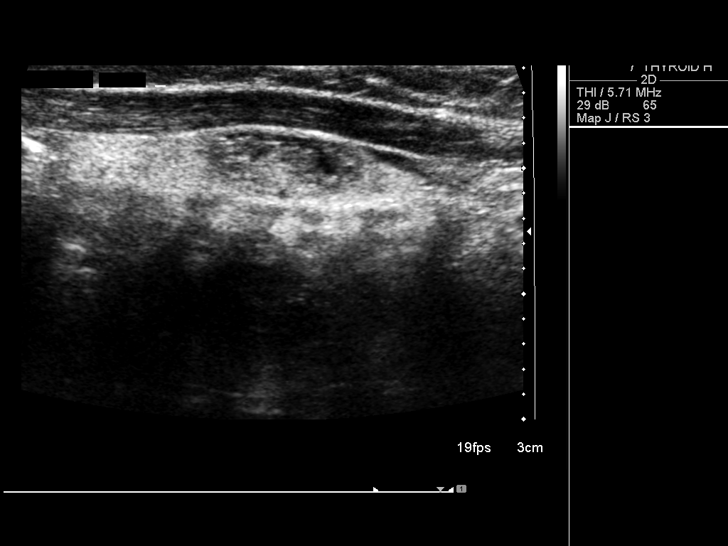
[im 8/18]
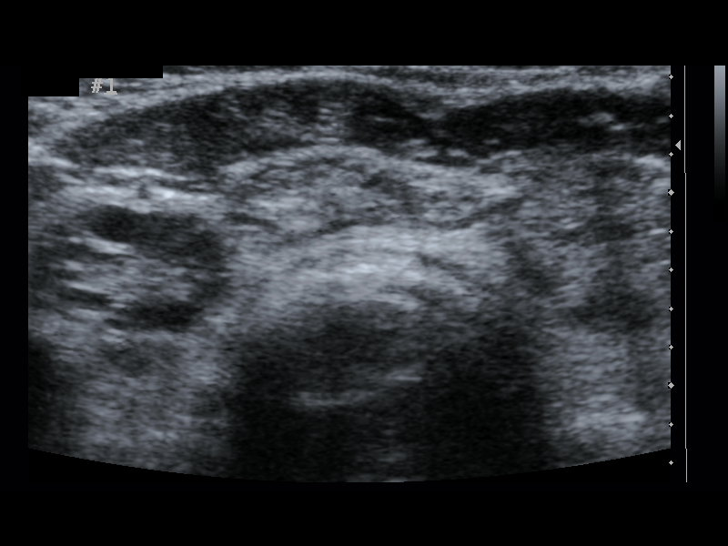
[im 9/18]
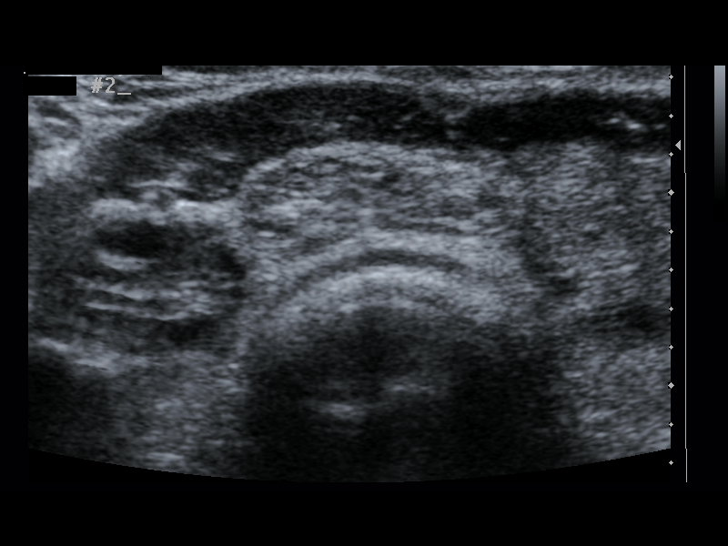
[im 10/18]
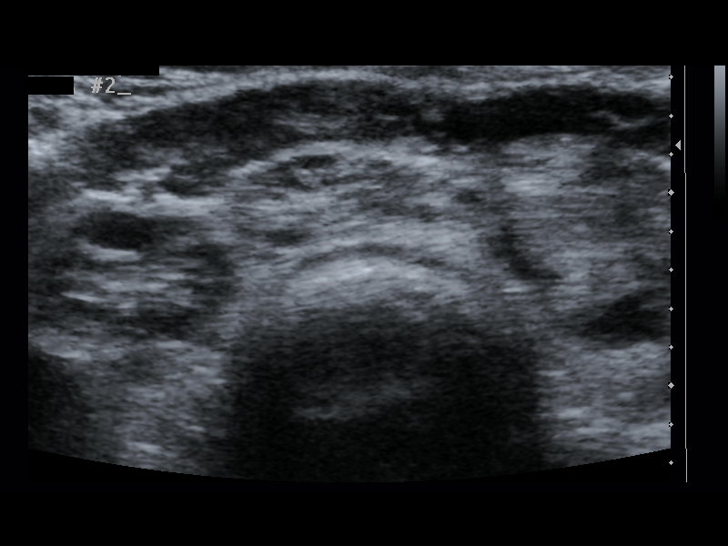
[im 11/18]
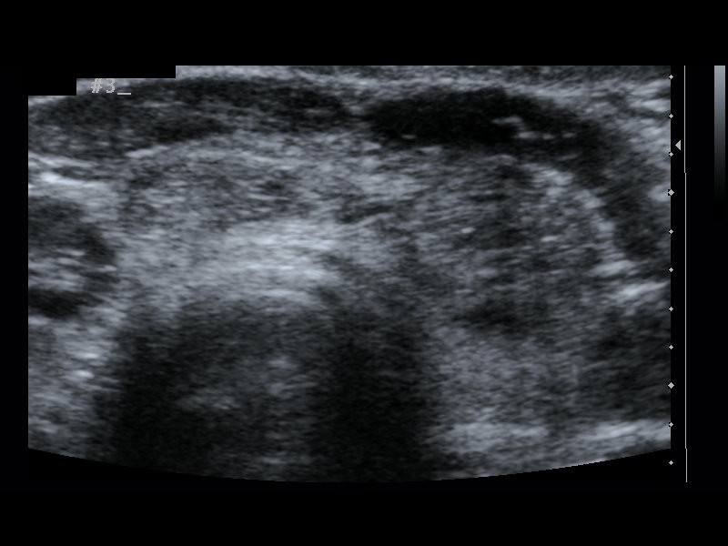
[im 13/18]
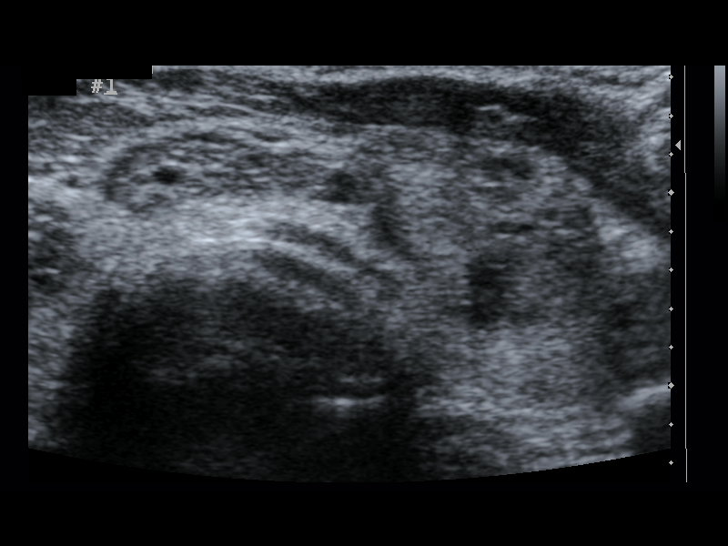
[im 14/18]
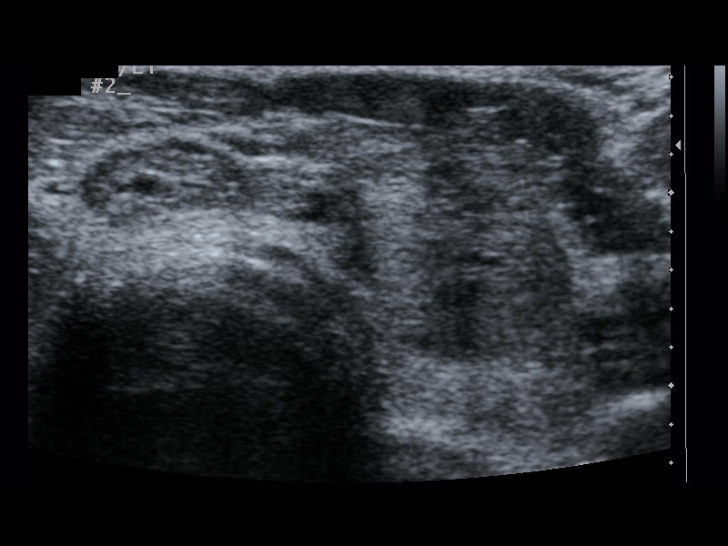
[im 15/18]
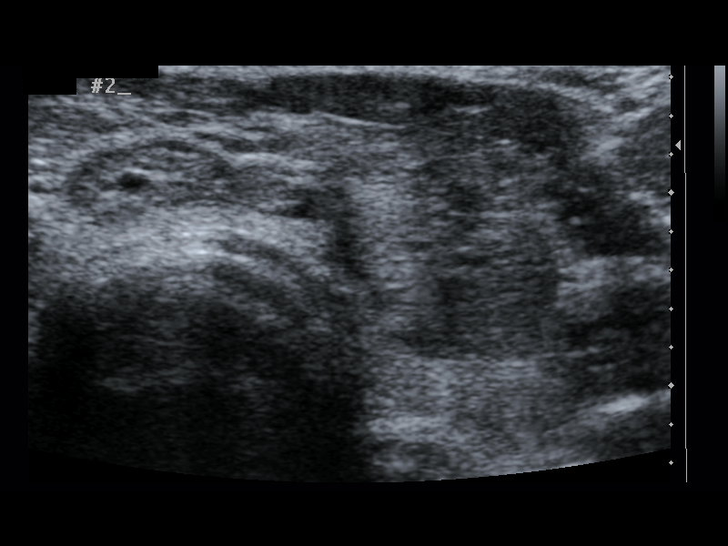
[im 17/18]
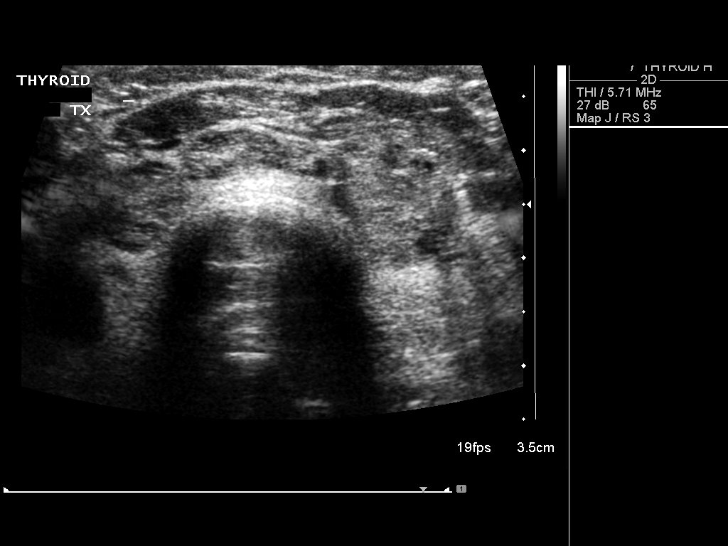
[im 18/18]
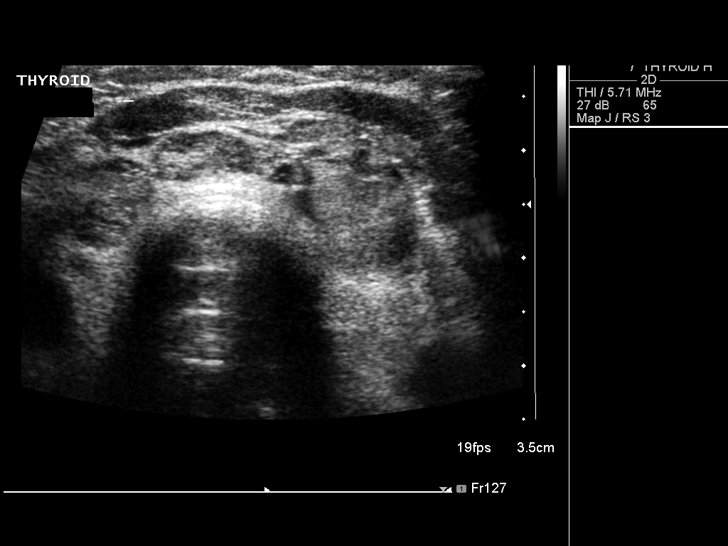

[14 of 18 positions shown; findings below may reference images not displayed]

FINDINGS: The images document guide needle placement within the
right and left isthmic thyroid nodules. Post biopsy images
demonstrate no hemorrhage.
IMPRESSION: Successful ultrasound-guided fine needle aspiration  biopsy of
bilateral isthmic thyroid nodules.

## 2011-09-26 ENCOUNTER — Encounter (INDEPENDENT_AMBULATORY_CARE_PROVIDER_SITE_OTHER): Payer: Self-pay | Admitting: Surgery

## 2011-09-28 ENCOUNTER — Ambulatory Visit (INDEPENDENT_AMBULATORY_CARE_PROVIDER_SITE_OTHER): Payer: Medicare Other | Admitting: Surgery

## 2011-09-28 ENCOUNTER — Encounter (INDEPENDENT_AMBULATORY_CARE_PROVIDER_SITE_OTHER): Payer: Self-pay | Admitting: Surgery

## 2011-09-28 VITALS — BP 102/84 | HR 100 | Temp 97.2°F | Resp 18 | Ht 65.0 in | Wt 179.0 lb

## 2011-09-28 DIAGNOSIS — E042 Nontoxic multinodular goiter: Secondary | ICD-10-CM

## 2011-09-28 NOTE — Progress Notes (Signed)
Visit Diagnoses: 1. Multiple thyroid nodules     HISTORY: Patient presents for followup with a history of bilateral thyroid nodules. Other issues which were present at her last visit have improved with treatment of her gastroesophageal reflux with proton pump inhibitors. She has been evaluated by ENT for dysphagia. Her dyspnea has improved.  At my request she underwent a followup thyroid ultrasound in late December 2012. This shows a relatively normal sized thyroid gland with multiple bilateral thyroid nodules. Largest nodule on the right measured 1.4 cm. The nodule in the isthmus measured 1.2 cm. Dominant nodule in the left lobe measured 1.9 cm. These were unchanged from her prior study in May 2012. No worrisome findings were identified.  Patient denies any new findings on self-examination. She denies any new complaints.  PERTINENT REVIEW OF SYSTEMS: Patient denies dysphagia. Denies dyspnea. No new masses or discomfort.  EXAM: HEENT: normocephalic; pupils equal and reactive; sclerae clear; dentition good; mucous membranes moist NECK:  Dominant soft nodule mid left lobe; asymmetric on extension; no palpable anterior or posterior cervical lymphadenopathy; no supraclavicular masses; no tenderness CHEST: clear to auscultation bilaterally without rales, rhonchi, or wheezes CARDIAC: regular rate and rhythm without significant murmur; peripheral pulses are full EXT:  non-tender without edema; no deformity NEURO: no gross focal deficits; no sign of tremor   IMPRESSION: Bilateral thyroid nodules, clinically stable  PLAN: The patient and I discussed all of the above findings. I provided her with a written copy of her thyroid ultrasound results. I have asked her to return in one year for followup physical examination and thyroid ultrasound.  Patient is seen her primary provider next month. I would asked that she have laboratory studies including a TSH level at that time and have a copy of those  results forwarded to my office for review.  I will see the patient back in one year.  Velora Heckler, MD, FACS General & Endocrine Surgery [facility]

## 2011-10-19 ENCOUNTER — Other Ambulatory Visit: Payer: Self-pay | Admitting: Physician Assistant

## 2011-10-19 ENCOUNTER — Other Ambulatory Visit (HOSPITAL_COMMUNITY)
Admission: RE | Admit: 2011-10-19 | Discharge: 2011-10-19 | Disposition: A | Discharge status: Home / Self Care | Payer: Medicare Other | Source: Ambulatory Visit | Attending: Family Medicine | Admitting: Family Medicine

## 2011-10-19 DIAGNOSIS — Z0000 Encounter for general adult medical examination without abnormal findings: Secondary | ICD-10-CM | POA: Insufficient documentation

## 2011-10-23 ENCOUNTER — Other Ambulatory Visit: Payer: Self-pay | Admitting: Family Medicine

## 2011-10-23 ENCOUNTER — Other Ambulatory Visit: Payer: Self-pay | Admitting: Physician Assistant

## 2011-10-23 DIAGNOSIS — Z1231 Encounter for screening mammogram for malignant neoplasm of breast: Secondary | ICD-10-CM

## 2011-10-27 ENCOUNTER — Encounter (INDEPENDENT_AMBULATORY_CARE_PROVIDER_SITE_OTHER): Payer: Self-pay

## 2011-11-08 ENCOUNTER — Ambulatory Visit
Admission: RE | Admit: 2011-11-08 | Discharge: 2011-11-08 | Disposition: A | Discharge status: Home / Self Care | Payer: Medicare Other | Source: Ambulatory Visit | Attending: Physician Assistant | Admitting: Physician Assistant

## 2011-11-08 DIAGNOSIS — Z1231 Encounter for screening mammogram for malignant neoplasm of breast: Secondary | ICD-10-CM

## 2011-12-14 DIAGNOSIS — H3552 Pigmentary retinal dystrophy: Secondary | ICD-10-CM | POA: Insufficient documentation

## 2012-09-10 ENCOUNTER — Telehealth (INDEPENDENT_AMBULATORY_CARE_PROVIDER_SITE_OTHER): Payer: Self-pay

## 2012-09-10 NOTE — Telephone Encounter (Signed)
LMOM phone # to [facility] for pt to call to set up u/s. Order is in epic.

## 2012-09-12 ENCOUNTER — Ambulatory Visit
Admission: RE | Admit: 2012-09-12 | Discharge: 2012-09-12 | Disposition: A | Discharge status: Home / Self Care | Payer: Medicare Other | Source: Ambulatory Visit | Attending: Surgery | Admitting: Surgery

## 2012-09-12 DIAGNOSIS — E042 Nontoxic multinodular goiter: Secondary | ICD-10-CM

## 2012-10-15 ENCOUNTER — Encounter (INDEPENDENT_AMBULATORY_CARE_PROVIDER_SITE_OTHER): Payer: Self-pay | Admitting: Surgery

## 2012-10-15 ENCOUNTER — Ambulatory Visit (INDEPENDENT_AMBULATORY_CARE_PROVIDER_SITE_OTHER): Payer: Medicare Other | Admitting: Surgery

## 2012-10-15 VITALS — BP 120/82 | HR 69 | Temp 97.7°F | Resp 18 | Ht 65.0 in | Wt 153.0 lb

## 2012-10-15 DIAGNOSIS — E042 Nontoxic multinodular goiter: Secondary | ICD-10-CM

## 2012-10-15 NOTE — Patient Instructions (Signed)

## 2012-10-15 NOTE — Progress Notes (Signed)
General Surgery - [facility]  Visit Diagnoses: 1. Multiple thyroid nodules     HISTORY: Patient is a 46-year-old white female followed for multiple bilateral thyroid nodules. At my request she underwent a followup ultrasound dated 09/12/2012. This shows interval enlargement of the right thyroid lobe now measuring 5.1 cm. Left lobe is essentially stable. There are multiple bilateral thyroid nodules. Most of these are stable. There is a dominant nodule in the right isthmus which is increased in size to 1.6 cm from 0.9 cm.  Patient is complaining of progressive compressive symptoms. She notes a globus sensation. She is having more problems with dysphagia at meals with both solids and liquids. She also complains of some neck tenderness.  Patient is interested in proceeding with thyroidectomy at this point in time.  PERTINENT REVIEW OF SYSTEMS: Progressive compressive symptoms as noted above. No tremor. No palpitations.  EXAM: HEENT: normocephalic; pupils equal and reactive; sclerae clear; dentition good; mucous membranes moist NECK:  Palpable nodules in the thyroid isthmus, mobile, mildly tender on the right; small nodules bilaterally in the mid thyroid lobes; symmetric on extension; no palpable anterior or posterior cervical lymphadenopathy; no supraclavicular masses; no tenderness CHEST: clear to auscultation bilaterally without rales, rhonchi, or wheezes CARDIAC: regular rate and rhythm without significant murmur; peripheral pulses are full EXT:  non-tender without edema; no deformity NEURO: no gross focal deficits; no sign of tremor   IMPRESSION: Multiple bilateral thyroid nodules, interval enlargement of right lobe and right isthmus nodule  PLAN: The patient and I had a lengthy discussion about the above findings. I explained to her that there was no absolute indication for thyroidectomy. However she does have relative indications for thyroidectomy with enlargement  of the right thyroid lobe, enlargement of one of her thyroid nodules, globus sensation, and progressive dysphagia. We discussed total thyroidectomy. We discussed the risk and benefits of the procedure including the risk of injury to recurrent laryngeal nerves and parathyroid glands. We discussed the need for lifelong thyroid hormone replacement therapy. We discussed the surgical incision to be anticipated. We discussed the hospital stay and her postoperative recovery. She understands and wishes to proceed. We will make arrangements for surgery at a time convenient for her in the near future.  The risks and benefits of the procedure have been discussed at length with the patient.  The patient understands the proposed procedure, potential alternative treatments, and the course of recovery to be expected.  All of the patient's questions have been answered at this time.  The patient wishes to proceed with surgery.  Baxter Gonzalez M. Oswald Pott, MD, FACS [facility] Office: [phone]    

## 2012-10-17 ENCOUNTER — Encounter (HOSPITAL_COMMUNITY): Payer: Self-pay | Admitting: Pharmacy Technician

## 2012-10-23 ENCOUNTER — Encounter (HOSPITAL_COMMUNITY): Payer: Self-pay

## 2012-10-23 ENCOUNTER — Encounter (HOSPITAL_COMMUNITY)
Admission: RE | Admit: 2012-10-23 | Discharge: 2012-10-23 | Disposition: A | Discharge status: Home / Self Care | Payer: Medicare Other | Source: Ambulatory Visit | Attending: Surgery | Admitting: Surgery

## 2012-10-23 ENCOUNTER — Ambulatory Visit (HOSPITAL_COMMUNITY)
Admission: RE | Admit: 2012-10-23 | Discharge: 2012-10-23 | Disposition: A | Discharge status: Home / Self Care | Payer: Medicare Other | Source: Ambulatory Visit | Attending: Surgery | Admitting: Surgery

## 2012-10-23 DIAGNOSIS — Z01818 Encounter for other preprocedural examination: Secondary | ICD-10-CM | POA: Insufficient documentation

## 2012-10-23 DIAGNOSIS — I341 Nonrheumatic mitral (valve) prolapse: Secondary | ICD-10-CM

## 2012-10-23 DIAGNOSIS — R1310 Dysphagia, unspecified: Secondary | ICD-10-CM

## 2012-10-23 DIAGNOSIS — Z01812 Encounter for preprocedural laboratory examination: Secondary | ICD-10-CM | POA: Insufficient documentation

## 2012-10-23 DIAGNOSIS — E049 Nontoxic goiter, unspecified: Secondary | ICD-10-CM

## 2012-10-23 DIAGNOSIS — H3552 Pigmentary retinal dystrophy: Secondary | ICD-10-CM

## 2012-10-23 HISTORY — DX: Pigmentary retinal dystrophy: H35.52

## 2012-10-23 HISTORY — DX: Nonrheumatic mitral (valve) prolapse: I34.1

## 2012-10-23 HISTORY — DX: Nontoxic goiter, unspecified: E04.9

## 2012-10-23 HISTORY — DX: Rosacea, unspecified: L71.9

## 2012-10-23 HISTORY — DX: Dysphagia, unspecified: R13.10

## 2012-10-23 LAB — HCG, SERUM, QUALITATIVE: Preg, Serum: NEGATIVE

## 2012-10-23 LAB — CBC
MCH: 28.6 pg (ref 26.0–34.0)
MCV: 86.8 fL (ref 78.0–100.0)
Platelets: 223 10*3/uL (ref 150–400)
RDW: 13.1 % (ref 11.5–15.5)
WBC: 5.5 10*3/uL (ref 4.0–10.5)

## 2012-10-23 NOTE — Progress Notes (Signed)
Quick Note:  These results are acceptable for scheduled surgery.  Daphney Hopke M. Stephanieann Popescu, MD, FACS [facility] Office: [phone]   ______ 

## 2012-10-23 NOTE — Pre-Procedure Instructions (Addendum)
10-23-12 CXR done  10-23-12 1545 Patient notified of Positive PCR screen for Staph aureus- will use Mupirocin Oint. As directed. W. Kennon Portela

## 2012-10-23 NOTE — Patient Instructions (Addendum)
20 Alyssa Fisher  10/23/2012   Your procedure is scheduled on:   10-29-2012  Report to [facility] at       0730 AM .  Call this number if you have problems the morning of surgery: [phone]  Or Presurgical Testing [phone](Jory Welke)     Do not eat food:After Midnight.      Take these medicines the morning of surgery with A SIP OF WATER: Omeprazole.   Do not wear jewelry, make-up or nail polish.  Do not wear lotions, powders, or perfumes. You may wear deodorant.  Do not shave 12 hours prior to first CHG shower(legs and under arms).(face and neck okay.)  Do not bring valuables to the hospital.  Contacts, dentures or bridgework,body piercing,  may not be worn into surgery.  Leave suitcase in the car. After surgery it may be brought to your room.  For patients admitted to the hospital, checkout time is 11:00 AM the day of discharge.   Patients discharged the day of surgery will not be allowed to drive home. Must have responsible person with you x 24 hours once discharged.  Name and phone number of your driver: David-spouse [phone] cell  Special Instructions: CHG Shower Use Special Wash: see special instructions.(avoid face and genitals)   Please read over the following fact sheets that you were given: MRSA Information.    Failure to follow these instructions may result in Cancellation of your surgery.   Patient signature_______________________________________________________

## 2012-10-29 ENCOUNTER — Encounter (HOSPITAL_COMMUNITY): Payer: Self-pay | Admitting: Anesthesiology

## 2012-10-29 ENCOUNTER — Ambulatory Visit (HOSPITAL_COMMUNITY): Payer: Medicare Other | Admitting: Anesthesiology

## 2012-10-29 ENCOUNTER — Encounter (HOSPITAL_COMMUNITY): Payer: Self-pay | Admitting: *Deleted

## 2012-10-29 ENCOUNTER — Encounter (HOSPITAL_COMMUNITY)
Admission: RE | Disposition: A | Discharge status: Home / Self Care | Payer: Self-pay | Source: Ambulatory Visit | Attending: Surgery

## 2012-10-29 ENCOUNTER — Observation Stay (HOSPITAL_COMMUNITY)
Admission: RE | Admit: 2012-10-29 | Discharge: 2012-10-30 | Disposition: A | Discharge status: Home / Self Care | Payer: Medicare Other | Source: Ambulatory Visit | Attending: Surgery | Admitting: Surgery

## 2012-10-29 DIAGNOSIS — E042 Nontoxic multinodular goiter: Principal | ICD-10-CM | POA: Insufficient documentation

## 2012-10-29 DIAGNOSIS — E041 Nontoxic single thyroid nodule: Secondary | ICD-10-CM

## 2012-10-29 HISTORY — PX: THYROIDECTOMY: SHX17

## 2012-10-29 SURGERY — THYROIDECTOMY
Anesthesia: General | Site: Throat | Wound class: Clean

## 2012-10-29 MED ORDER — DEXAMETHASONE SODIUM PHOSPHATE 10 MG/ML IJ SOLN
INTRAMUSCULAR | Status: DC | PRN
  Administered 2012-10-29: 10 mg via INTRAVENOUS

## 2012-10-29 MED ORDER — ACETAMINOPHEN 325 MG PO TABS: 650.0000 mg | ORAL_TABLET | ORAL | Status: DC | PRN

## 2012-10-29 MED ORDER — PANTOPRAZOLE SODIUM 40 MG PO TBEC
40.0000 mg | DELAYED_RELEASE_TABLET | Freq: Every day | ORAL | Status: DC
  Administered 2012-10-30: 40 mg via ORAL
  Filled 2012-10-29: qty 1

## 2012-10-29 MED ORDER — ROCURONIUM BROMIDE 100 MG/10ML IV SOLN
INTRAVENOUS | Status: DC | PRN
  Administered 2012-10-29: 35 mg via INTRAVENOUS
  Administered 2012-10-29: 10 mg via INTRAVENOUS

## 2012-10-29 MED ORDER — GLYCOPYRROLATE 0.2 MG/ML IJ SOLN
INTRAMUSCULAR | Status: DC | PRN
  Administered 2012-10-29: .6 mg via INTRAVENOUS

## 2012-10-29 MED ORDER — ONDANSETRON HCL 4 MG/2ML IJ SOLN
INTRAMUSCULAR | Status: DC | PRN
  Administered 2012-10-29: 4 mg via INTRAVENOUS

## 2012-10-29 MED ORDER — LACTATED RINGERS IV SOLN
INTRAVENOUS | Status: DC | PRN
  Administered 2012-10-29: 10:00:00 via INTRAVENOUS

## 2012-10-29 MED ORDER — LIDOCAINE HCL (CARDIAC) 20 MG/ML IV SOLN
INTRAVENOUS | Status: DC | PRN
  Administered 2012-10-29: 80 mg via INTRAVENOUS

## 2012-10-29 MED ORDER — HYDROMORPHONE HCL PF 1 MG/ML IJ SOLN
0.2500 mg | INTRAMUSCULAR | Status: DC | PRN
  Administered 2012-10-29 (×2): 0.25 mg via INTRAVENOUS
  Administered 2012-10-29: 0.5 mg via INTRAVENOUS

## 2012-10-29 MED ORDER — CEFAZOLIN SODIUM-DEXTROSE 2-3 GM-% IV SOLR
2.0000 g | INTRAVENOUS | Status: AC
  Administered 2012-10-29: 2 g via INTRAVENOUS

## 2012-10-29 MED ORDER — PROPOFOL 10 MG/ML IV BOLUS
INTRAVENOUS | Status: DC | PRN
  Administered 2012-10-29: 150 mg via INTRAVENOUS

## 2012-10-29 MED ORDER — PROMETHAZINE HCL 25 MG/ML IJ SOLN: 6.2500 mg | INTRAMUSCULAR | Status: DC | PRN

## 2012-10-29 MED ORDER — NEOSTIGMINE METHYLSULFATE 1 MG/ML IJ SOLN
INTRAMUSCULAR | Status: DC | PRN
  Administered 2012-10-29: 4 mg via INTRAVENOUS

## 2012-10-29 MED ORDER — HYDROMORPHONE HCL PF 1 MG/ML IJ SOLN
1.0000 mg | INTRAMUSCULAR | Status: DC | PRN
  Administered 2012-10-29 (×4): 1 mg via INTRAVENOUS
  Filled 2012-10-29 (×4): qty 1

## 2012-10-29 MED ORDER — HYDROCODONE-ACETAMINOPHEN 5-325 MG PO TABS
1.0000 | ORAL_TABLET | ORAL | Status: DC | PRN
  Administered 2012-10-30 (×2): 2 via ORAL
  Filled 2012-10-29 (×2): qty 2

## 2012-10-29 MED ORDER — PANTOPRAZOLE SODIUM 40 MG PO TBEC: 40.0000 mg | DELAYED_RELEASE_TABLET | Freq: Every day | ORAL | Status: DC

## 2012-10-29 MED ORDER — ACETAMINOPHEN 10 MG/ML IV SOLN
INTRAVENOUS | Status: DC | PRN
  Administered 2012-10-29: 1000 mg via INTRAVENOUS

## 2012-10-29 MED ORDER — CALCIUM CARBONATE 1250 (500 CA) MG PO TABS
1250.0000 mg | ORAL_TABLET | Freq: Three times a day (TID) | ORAL | Status: DC
  Administered 2012-10-29 – 2012-10-30 (×3): 1250 mg via ORAL
  Filled 2012-10-29 (×5): qty 1

## 2012-10-29 MED ORDER — 0.9 % SODIUM CHLORIDE (POUR BTL) OPTIME
TOPICAL | Status: DC | PRN
  Administered 2012-10-29: 1000 mL

## 2012-10-29 MED ORDER — MIDAZOLAM HCL 5 MG/5ML IJ SOLN
INTRAMUSCULAR | Status: DC | PRN
  Administered 2012-10-29: 2 mg via INTRAVENOUS

## 2012-10-29 MED ORDER — KCL IN DEXTROSE-NACL 20-5-0.45 MEQ/L-%-% IV SOLN
INTRAVENOUS | Status: DC
  Administered 2012-10-29 – 2012-10-30 (×2): via INTRAVENOUS
  Filled 2012-10-29 (×2): qty 1000

## 2012-10-29 MED ORDER — METOCLOPRAMIDE HCL 5 MG/ML IJ SOLN
INTRAMUSCULAR | Status: DC | PRN
  Administered 2012-10-29: 10 mg via INTRAVENOUS

## 2012-10-29 MED ORDER — FENTANYL CITRATE 0.05 MG/ML IJ SOLN
INTRAMUSCULAR | Status: DC | PRN
  Administered 2012-10-29 (×7): 50 ug via INTRAVENOUS

## 2012-10-29 SURGICAL SUPPLY — 38 items
ATTRACTOMAT 16X20 MAGNETIC DRP (DRAPES) ×2 IMPLANT
BENZOIN TINCTURE PRP APPL 2/3 (GAUZE/BANDAGES/DRESSINGS) ×2 IMPLANT
BLADE HEX COATED 2.75 (ELECTRODE) ×2 IMPLANT
BLADE SURG 15 STRL LF DISP TIS (BLADE) ×1 IMPLANT
BLADE SURG 15 STRL SS (BLADE) ×1
CANISTER SUCTION 2500CC (MISCELLANEOUS) ×2 IMPLANT
CHLORAPREP W/TINT 10.5 ML (MISCELLANEOUS) ×2 IMPLANT
CLIP TI MEDIUM 6 (CLIP) ×4 IMPLANT
CLIP TI WIDE RED SMALL 6 (CLIP) ×8 IMPLANT
CLOTH BEACON ORANGE TIMEOUT ST (SAFETY) ×2 IMPLANT
CLSR STERI-STRIP ANTIMIC 1/2X4 (GAUZE/BANDAGES/DRESSINGS) ×2 IMPLANT
DISSECTOR ROUND CHERRY 3/8 STR (MISCELLANEOUS) IMPLANT
DRAPE PED LAPAROTOMY (DRAPES) ×2 IMPLANT
DRESSING SURGICEL FIBRLLR 1X2 (HEMOSTASIS) ×1 IMPLANT
DRSG SURGICEL FIBRILLAR 1X2 (HEMOSTASIS) ×2
ELECT REM PT RETURN 9FT ADLT (ELECTROSURGICAL) ×2
ELECTRODE REM PT RTRN 9FT ADLT (ELECTROSURGICAL) ×1 IMPLANT
GAUZE SPONGE 4X4 16PLY XRAY LF (GAUZE/BANDAGES/DRESSINGS) ×2 IMPLANT
GLOVE SURG ORTHO 8.0 STRL STRW (GLOVE) ×2 IMPLANT
GOWN STRL NON-REIN LRG LVL3 (GOWN DISPOSABLE) ×2 IMPLANT
GOWN STRL REIN XL XLG (GOWN DISPOSABLE) ×4 IMPLANT
KIT BASIN OR (CUSTOM PROCEDURE TRAY) ×2 IMPLANT
NS IRRIG 1000ML POUR BTL (IV SOLUTION) ×2 IMPLANT
PACK BASIC VI WITH GOWN DISP (CUSTOM PROCEDURE TRAY) ×2 IMPLANT
PENCIL BUTTON HOLSTER BLD 10FT (ELECTRODE) ×2 IMPLANT
SHEARS HARMONIC 9CM CVD (BLADE) ×2 IMPLANT
SPONGE GAUZE 4X4 12PLY (GAUZE/BANDAGES/DRESSINGS) ×2 IMPLANT
STAPLER VISISTAT 35W (STAPLE) ×2 IMPLANT
STRIP CLOSURE SKIN 1/2X4 (GAUZE/BANDAGES/DRESSINGS) ×2 IMPLANT
SUT MNCRL AB 4-0 PS2 18 (SUTURE) ×2 IMPLANT
SUT SILK 2 0 (SUTURE) ×1
SUT SILK 2-0 18XBRD TIE 12 (SUTURE) ×1 IMPLANT
SUT SILK 3 0 (SUTURE)
SUT SILK 3-0 18XBRD TIE 12 (SUTURE) IMPLANT
SUT VIC AB 3-0 SH 18 (SUTURE) ×2 IMPLANT
SYR BULB IRRIGATION 50ML (SYRINGE) ×2 IMPLANT
TOWEL OR 17X26 10 PK STRL BLUE (TOWEL DISPOSABLE) ×2 IMPLANT
YANKAUER SUCT BULB TIP 10FT TU (MISCELLANEOUS) ×2 IMPLANT

## 2012-10-29 NOTE — Interval H&P Note (Signed)
History and Physical Interval Note:  10/29/2012 9:27 AM  Alyssa Fisher  has presented today for surgery, with the diagnosis of multiple thyroid nodules.  The various methods of treatment have been discussed with the patient and family. After consideration of risks, benefits and other options for treatment, the patient has consented to    Procedure(s) with comments: THYROIDECTOMY (N/A) - Total Thyroidectomy as a surgical intervention .    The patient's history has been reviewed, patient examined, no change in status, stable for surgery.  I have reviewed the patient's chart and labs.  Questions were answered to the patient's satisfaction.    Velora Heckler, MD, FACS General & Endocrine Surgery [facility] Office: [phone]   Alyssa Fisher

## 2012-10-29 NOTE — Brief Op Note (Signed)
10/29/2012  11:41 AM  PATIENT:  Alyssa Fisher  47 y.o. female  PRE-OPERATIVE DIAGNOSIS:  multiple thyroid nodules  POST-OPERATIVE DIAGNOSIS:  multiple thyroid nodules  PROCEDURE:  Procedure(s) with comments: THYROIDECTOMY (N/A) - Total Thyroidectomy  SURGEON:  Surgeon(s) and Role:    * Velora Heckler, MD - Primary    * Axel Filler, MD - Assisting  ANESTHESIA:   general  EBL:  Total I/O In: 1000 [I.V.:1000] Out: 35 [Blood:35]  BLOOD ADMINISTERED:none  DRAINS: none   LOCAL MEDICATIONS USED:  NONE  SPECIMEN:  Excision  DISPOSITION OF SPECIMEN:  PATHOLOGY  COUNTS:  YES  TOURNIQUET:  * No tourniquets in log *  DICTATION: .Other Dictation: Dictation Number [id number]  PLAN OF CARE: Admit for overnight observation  PATIENT DISPOSITION:  PACU - hemodynamically stable.   Delay start of Pharmacological VTE agent (>24hrs) due to surgical blood loss or risk of bleeding: yes  Velora Heckler, MD, FACS General & Endocrine Surgery [facility] Office: [phone]

## 2012-10-29 NOTE — Preoperative (Signed)
Beta Blockers   Reason not to administer Beta Blockers:Not Applicable 

## 2012-10-29 NOTE — H&P (View-Only) (Signed)
General Surgery [facility]  Visit Diagnoses: 1. Multiple thyroid nodules     HISTORY: Patient is a 47 year old white female followed for multiple bilateral thyroid nodules. At my request she underwent a followup ultrasound dated 09/12/2012. This shows interval enlargement of the right thyroid lobe now measuring 5.1 cm. Left lobe is essentially stable. There are multiple bilateral thyroid nodules. Most of these are stable. There is a dominant nodule in the right isthmus which is increased in size to 1.6 cm from 0.9 cm.  Patient is complaining of progressive compressive symptoms. She notes a globus sensation. She is having more problems with dysphagia at meals with both solids and liquids. She also complains of some neck tenderness.  Patient is interested in proceeding with thyroidectomy at this point in time.  PERTINENT REVIEW OF SYSTEMS: Progressive compressive symptoms as noted above. No tremor. No palpitations.  EXAM: HEENT: normocephalic; pupils equal and reactive; sclerae clear; dentition good; mucous membranes moist NECK:  Palpable nodules in the thyroid isthmus, mobile, mildly tender on the right; small nodules bilaterally in the mid thyroid lobes; symmetric on extension; no palpable anterior or posterior cervical lymphadenopathy; no supraclavicular masses; no tenderness CHEST: clear to auscultation bilaterally without rales, rhonchi, or wheezes CARDIAC: regular rate and rhythm without significant murmur; peripheral pulses are full EXT:  non-tender without edema; no deformity NEURO: no gross focal deficits; no sign of tremor   IMPRESSION: Multiple bilateral thyroid nodules, interval enlargement of right lobe and right isthmus nodule  PLAN: The patient and I had a lengthy discussion about the above findings. I explained to her that there was no absolute indication for thyroidectomy. However she does have relative indications for thyroidectomy with enlargement  of the right thyroid lobe, enlargement of one of her thyroid nodules, globus sensation, and progressive dysphagia. We discussed total thyroidectomy. We discussed the risk and benefits of the procedure including the risk of injury to recurrent laryngeal nerves and parathyroid glands. We discussed the need for lifelong thyroid hormone replacement therapy. We discussed the surgical incision to be anticipated. We discussed the hospital stay and her postoperative recovery. She understands and wishes to proceed. We will make arrangements for surgery at a time convenient for her in the near future.  The risks and benefits of the procedure have been discussed at length with the patient.  The patient understands the proposed procedure, potential alternative treatments, and the course of recovery to be expected.  All of the patient's questions have been answered at this time.  The patient wishes to proceed with surgery.  Velora Heckler, MD, [facility] Office: [phone]

## 2012-10-29 NOTE — Transfer of Care (Signed)
Immediate Anesthesia Transfer of Care Note  Patient: Alyssa Fisher  Procedure(s) Performed: Procedure(s) with comments: THYROIDECTOMY (N/A) - Total Thyroidectomy  Patient Location: PACU  Anesthesia Type:General  Level of Consciousness: awake, alert , oriented and patient cooperative  Airway & Oxygen Therapy: Patient Spontanous Breathing and Patient connected to face mask oxygen  Post-op Assessment: Report given to PACU RN, Post -op Vital signs reviewed and stable and Patient moving all extremities  Post vital signs: Reviewed and stable  Complications: No apparent anesthesia complications

## 2012-10-29 NOTE — Anesthesia Preprocedure Evaluation (Addendum)
Anesthesia Evaluation  Patient identified by MRN, date of birth, ID band Patient awake    Reviewed: Allergy & Precautions, H&P , NPO status , Patient's Chart, lab work & pertinent test results  Airway Mallampati: II TM Distance: >3 FB Neck ROM: Full    Dental no notable dental hx.    Pulmonary neg pulmonary ROS,  breath sounds clear to auscultation  Pulmonary exam normal       Cardiovascular negative cardio ROS  Rhythm:Regular Rate:Normal     Neuro/Psych negative neurological ROS  negative psych ROS   GI/Hepatic negative GI ROS, Neg liver ROS,   Endo/Other  negative endocrine ROS  Renal/GU negative Renal ROS  negative genitourinary   Musculoskeletal negative musculoskeletal ROS (+)   Abdominal   Peds negative pediatric ROS (+)  Hematology negative hematology ROS (+)   Anesthesia Other Findings   Reproductive/Obstetrics negative OB ROS                          Anesthesia Physical Anesthesia Plan  ASA: I  Anesthesia Plan: General   Post-op Pain Management:    Induction: Intravenous  Airway Management Planned: Oral ETT  Additional Equipment:   Intra-op Plan:   Post-operative Plan: Extubation in OR  Informed Consent: I have reviewed the patients History and Physical, chart, labs and discussed the procedure including the risks, benefits and alternatives for the proposed anesthesia with the patient or authorized representative who has indicated his/her understanding and acceptance.   Dental advisory given  Plan Discussed with: CRNA and Surgeon  Anesthesia Plan Comments:         Anesthesia Quick Evaluation  

## 2012-10-29 NOTE — Anesthesia Postprocedure Evaluation (Signed)
  Anesthesia Post-op Note  Patient: Alyssa Fisher  Procedure(s) Performed: Procedure(s) (LRB): THYROIDECTOMY (N/A)  Patient Location: PACU  Anesthesia Type: General  Level of Consciousness: awake and alert   Airway and Oxygen Therapy: Patient Spontanous Breathing  Post-op Pain: mild  Post-op Assessment: Post-op Vital signs reviewed, Patient's Cardiovascular Status Stable, Respiratory Function Stable, Patent Airway and No signs of Nausea or vomiting  Last Vitals:  Filed Vitals:   10/29/12 1230  BP: 133/76  Pulse: 67  Temp: 36.9 C  Resp: 14    Post-op Vital Signs: stable   Complications: No apparent anesthesia complications

## 2012-10-30 LAB — BASIC METABOLIC PANEL
BUN: 7 mg/dL (ref 6–23)
CO2: 26 mEq/L (ref 19–32)
Chloride: 100 mEq/L (ref 96–112)
Creatinine, Ser: 0.73 mg/dL (ref 0.50–1.10)

## 2012-10-30 MED ORDER — HYDROCODONE-ACETAMINOPHEN 5-325 MG PO TABS: 1.0000 | ORAL_TABLET | ORAL | Status: DC | PRN

## 2012-10-30 MED ORDER — CALCIUM CARBONATE 1250 (500 CA) MG PO CAPS: 1250.0000 mg | ORAL_CAPSULE | Freq: Three times a day (TID) | ORAL | Status: DC

## 2012-10-30 MED ORDER — OMEPRAZOLE 20 MG PO CPDR: 40.0000 mg | DELAYED_RELEASE_CAPSULE | Freq: Every day | ORAL | Status: DC

## 2012-10-30 MED ORDER — SODIUM CHLORIDE 0.9 % IV SOLN
1.0000 g | INTRAVENOUS | Status: AC
  Administered 2012-10-30: 1 g via INTRAVENOUS
  Filled 2012-10-30: qty 10

## 2012-10-30 MED ORDER — SYNTHROID 88 MCG PO TABS: 88.0000 ug | ORAL_TABLET | Freq: Every day | ORAL | Status: DC

## 2012-10-30 NOTE — Care Management Note (Signed)
    Page 1 of 1   10/30/2012     10:55:52 AM   CARE MANAGEMENT NOTE 10/30/2012  Patient:  Alyssa Fisher, Alyssa Fisher   Account Number:  [id number]  Date Initiated:  10/30/2012  Documentation initiated by:  Lorenda Ishihara  Subjective/Objective Assessment:   47 yo female admitted s/p total thyroidectomy.     Action/Plan:   Home when stable   Anticipated DC Date:  10/30/2012   Anticipated DC Plan:  HOME/SELF CARE      DC Planning Services  CM consult      Choice offered to / List presented to:             Status of service:  Completed, signed off Medicare Important Message given?  NA - LOS <3 / Initial given by admissions (If response is "NO", the following Medicare IM given date fields will be blank) Date Medicare IM given:   Date Additional Medicare IM given:    Discharge Disposition:  HOME/SELF CARE  Per UR Regulation:  Reviewed for med. necessity/level of care/duration of stay  If discussed at Long Length of Stay Meetings, dates discussed:    Comments:

## 2012-10-30 NOTE — Op Note (Signed)
NAMEALEXANDR, OEHLER NO.:  [id number]  MEDICAL RECORD NO.:  [id number]  LOCATION:  1523                         FACILITY:  [facility]  PHYSICIAN:  Velora Heckler, MD      DATE OF BIRTH:  1966-02-16  DATE OF PROCEDURE:  10/29/2012                               OPERATIVE REPORT   PREOPERATIVE DIAGNOSIS:  Bilateral thyroid nodules.  POSTOPERATIVE DIAGNOSIS:  Bilateral thyroid nodules.  PROCEDURE:  Total thyroidectomy.  SURGEON:  Velora Heckler, MD, FACS  ASSISTANT:  Axel Filler, MD  ANESTHESIA:  General per Dr. Eilene Ghazi.  ESTIMATED BLOOD LOSS:  Minimal.  PREPARATION:  ChloraPrep.  COMPLICATIONS:  None.  INDICATIONS:  The patient is a 47 year old white female followed for bilateral thyroid nodules.  She underwent sequential ultrasound scanning, which showed interval enlargement of nodules in the right isthmus and right thyroid lobe.  The patient now comes to Surgery for surgical resection for definitive diagnosis.  BODY OF REPORT:  Procedure was done in OR #11 at the [facility].  The patient was brought to the operating room and placed in supine position on the operating room table.  Following administration of general anesthesia, the patient was positioned and then prepped and draped in the usual strict aseptic fashion.  After ascertaining that, an adequate level of anesthesia had been achieved, a Kocher incision was made with a #15 blade.  Dissection was carried through the skin and subcutaneous tissues.  Platysma was divided with the electrocautery.  Subplatysmal flaps were elevated from the thyroid notch to the sternal notch.  A Mahorner self-retaining retractor was placed for exposure.  Strap muscles were incised in the midline.  Dissection was begun on the left.  Strap muscles were reflected laterally.  Left thyroid lobe was relatively small, but contained multiple nodules.  It was gently mobilized.  Superior pole vessels  were divided individually between small and medium Ligaclips with the Harmonic scalpel.  Inferior venous tributaries were divided between Ligaclips.  Gland was rolled anteriorly.  Branches of the inferior thyroid artery were divided between small Ligaclips with the Harmonic scalpel.  Superior and inferior parathyroid tissues were identified and preserved on its vascular pedicle.  Recurrent nerve was identified.  Ligament of Allyson Sabal was released with the electrocautery and the gland was mobilized up and onto the trachea.  Isthmus was mobilized across the midline.  A small pyramidal lobe was dissected off the anterior thyroid cartilage and resected en bloc with the isthmus.  Dry pack was placed in the left neck.  Next, we turned our attention to the right thyroid lobe.  Right lobe was slightly larger.  It does contain multiple nodules.  It was gently mobilized.  Superior pole vessels were divided individually between Ligaclips with the Harmonic scalpel.  Superior parathyroid gland was identified and preserved on its vascular pedicle.  Inferior venous tributaries were divided between medium Ligaclips with the Harmonic scalpel.  Inferior parathyroid gland was dissected off the capsule of the thyroid and preserved on its vascular pedicle.  Branches of the inferior thyroid artery were divided between small Ligaclips with the Harmonic scalpel.  Recurrent nerve was identified and  preserved. Ligament of Allyson Sabal was released with the electrocautery and the gland was mobilized onto the anterior trachea from which it was completely resected with the electrocautery used for hemostasis.  Suture was used to mark the left superior pole of the thyroid gland. The thyroid gland was then submitted in its entirety to Pathology for review.  The neck was irrigated with warm saline.  Good hemostasis was noted bilaterally.  Surgicel was placed throughout the operative field.  Strap muscles were reapproximated in  the midline with interrupted 3-0 Vicryl sutures.  Platysma was closed with interrupted 3-0 Vicryl sutures.  Skin was closed with a running 4-0 Monocryl subcuticular suture.  Wound was washed and dried, and benzoin and Steri-Strips were applied.  Sterile dressings were applied.  The patient was awakened from anesthesia and brought to the recovery room.  The patient tolerated the procedure well.   Velora Heckler, MD, FACS General & Endocrine Surgery [facility] Office: [phone]  TMG/MEDQ  D:  10/29/2012  T:  10/30/2012  Job:  [id number]

## 2012-10-30 NOTE — Discharge Summary (Signed)
Physician Discharge Summary [facility]  Patient ID: Alyssa Fisher MRN: [id number] DOB/AGE: Nov 03, 1965 47 y.o.  Admit date: 10/29/2012 Discharge date: 10/30/2012  Admission Diagnoses:  Bilateral thyroid nodules  Discharge Diagnoses:  Principal Problem:   Multiple thyroid nodules   Discharged Condition: good  Hospital Course: Patient was admitted for observation following total thyroidectomy.  Post op course stable.  Calcium level fell to 8.2 on morning following surgery and IV calcium gluconate was administered prior to discharge.  Prepared for discharge home on POD#1.  Consults: None  Significant Diagnostic Studies: labs: calcium  Treatments: surgery: total thyroidectomy  Discharge Exam: Blood pressure 114/63, pulse 74, temperature 98.4 F (36.9 C), temperature source Axillary, resp. rate 19, height 5\' 5"  (1.651 m), weight 152 lb (68.947 kg), last menstrual period 10/12/2012, SpO2 98.00%. HEENT - clear Neck - incision clear and dry; minimal STS; voice normal Chest - clear Cor - RRR  Disposition:  Home with family  Discharge Orders   Future Appointments Provider Department Dept Phone   11/11/2012 2:00 PM Velora Heckler, MD [facility], [place] [phone]   Future Orders Complete By Expires     Apply dressing  As directed     Comments:      Apply light gauze dressing to wound before discharge.    Diet - low sodium heart healthy  As directed     Discharge instructions  As directed     Comments:      THYROID & PARATHYROID SURGERY - POST OP INSTRUCTIONS  Always review your discharge instruction sheet from the facility where your surgery was performed.  A prescription for pain medication may be given to you upon discharge.  Take your pain medication as prescribed.  If narcotic pain medicine is not needed, then you may take acetaminophen (Tylenol) or ibuprofen (Advil) as needed.  Take your usually prescribed medications unless otherwise  directed.  If you need a refill on your pain medication, please contact your pharmacy. They will contact our office to request authorization.  Prescriptions will not be processed after 5 pm or on weekends.  Start with a light diet upon arrival home, such as soup and crackers or toast.  Be sure to drink plenty of fluids daily.  Resume your normal diet the day after surgery.  Most patients will experience some swelling and bruising on the chest and neck area.  Ice packs will help.  Swelling and bruising can take several days to resolve.   It is common to experience some constipation if taking pain medication after surgery.  Increasing fluid intake and taking a stool softener will usually help or prevent this problem.  A mild laxative (Milk of Magnesia or Miralax) should be taken according to package directions if there are no bowel movements after 48 hours.  You may remove your bandages 24-48 hours after surgery, and you may shower at that time.  You have steri-strips (small skin tapes) in place directly over the incision.  These strips should be left on the skin for 7-10 days and then removed.  You may resume regular (light) daily activities beginning the next day-such as daily self-care, walking, climbing stairs-gradually increasing activities as tolerated.  You may have sexual intercourse when it is comfortable.  Refrain from any heavy lifting or straining until approved by your doctor.  You may drive when you no longer are taking prescription pain medication, you can comfortably wear a seatbelt, and you can safely maneuver your car and apply  brakes.  You should see your doctor in the office for a follow-up appointment approximately two to three weeks after your surgery.  Make sure that you call for this appointment within a day or two after you arrive home to insure a convenient appointment time.  WHEN TO CALL YOUR DOCTOR: -- Fever greater than 101.5 -- Inability to urinate -- Nausea and/or  vomiting - persistent -- Extreme swelling or bruising -- Continued bleeding from incision -- Increased pain, redness, or drainage from the incision -- Difficulty swallowing or breathing -- Muscle cramping or spasms -- Numbness or tingling in hands or around lips  The clinic staff is available to answer your questions during regular business hours.  Please don't hesitate to call and ask to speak to one of the nurses if you have concerns.  Velora Heckler, MD, FACS General & Endocrine Surgery [facility] Office: [phone]    Increase activity slowly  As directed     Remove dressing in 24 hours  As directed         Medication List    TAKE these medications       calcium carbonate 1250 MG capsule  Take 1 capsule (1,250 mg total) by mouth 3 (three) times daily between meals.     guaiFENesin 600 MG 12 hr tablet  Commonly known as:  MUCINEX  Take 600 mg by mouth 2 (two) times daily as needed for congestion.     HYDROcodone-acetaminophen 5-325 MG per tablet  Commonly known as:  NORCO/VICODIN  Take 1-2 tablets by mouth every 4 (four) hours as needed for pain.     ibuprofen 200 MG tablet  Commonly known as:  ADVIL,MOTRIN  Take 400 mg by mouth every 6 (six) hours as needed for pain.     metroNIDAZOLE 0.75 % gel  Commonly known as:  METROGEL  Apply 1 application topically 2 (two) times daily. Apply to face     omeprazole 40 MG capsule  Commonly known as:  PRILOSEC  Take 40 mg by mouth daily as needed (for upset stomach).     omeprazole 20 MG capsule  Commonly known as:  PRILOSEC  Take 2 capsules (40 mg total) by mouth daily.     SYNTHROID 88 MCG tablet  Generic drug:  levothyroxine  Take 1 tablet (88 mcg total) by mouth daily.         Velora Heckler, MD, [facility] Office: [phone]   Signed: Velora Heckler 10/30/2012, 3:11 PM

## 2012-10-30 NOTE — Progress Notes (Signed)
Gauze drsg applied to steri-strips on anterior neck incision. Rx for norco given. Patient states understanding of discharge instructions.

## 2012-10-31 ENCOUNTER — Encounter (HOSPITAL_COMMUNITY): Payer: Self-pay | Admitting: Surgery

## 2012-10-31 NOTE — Progress Notes (Signed)
Quick Note:  Please contact patient and notify of benign pathology results.  Jerrika Ledlow M. Kenniel Bergsma, MD, FACS [facility] Office: [phone]   ______ 

## 2012-11-01 ENCOUNTER — Telehealth (INDEPENDENT_AMBULATORY_CARE_PROVIDER_SITE_OTHER): Payer: Self-pay

## 2012-11-01 NOTE — Telephone Encounter (Signed)
Pt home doing well. Path result given. Pt advised of labs needed. Lab slip mailed to pt.

## 2012-11-08 ENCOUNTER — Telehealth (INDEPENDENT_AMBULATORY_CARE_PROVIDER_SITE_OTHER): Payer: Self-pay | Admitting: General Surgery

## 2012-11-08 NOTE — Telephone Encounter (Signed)
Pt called to report [facility] is closed and she was to get labwork today for appt on Monday.  Pt will go in early on Monday for her labs and attend appt as scheduled.  Dr. Gerrit Friends is aware.

## 2012-11-11 ENCOUNTER — Encounter (INDEPENDENT_AMBULATORY_CARE_PROVIDER_SITE_OTHER): Payer: Self-pay | Admitting: Surgery

## 2012-11-11 ENCOUNTER — Ambulatory Visit (INDEPENDENT_AMBULATORY_CARE_PROVIDER_SITE_OTHER): Payer: Medicare Other | Admitting: Surgery

## 2012-11-11 VITALS — BP 118/76 | HR 80 | Temp 98.5°F | Resp 18 | Ht 65.0 in | Wt 152.0 lb

## 2012-11-11 DIAGNOSIS — E042 Nontoxic multinodular goiter: Secondary | ICD-10-CM

## 2012-11-11 NOTE — Patient Instructions (Signed)
  COCOA BUTTER & VITAMIN E CREAM  (Palmer's or other brand)  Apply cocoa butter/vitamin E cream to your incision 2 - 3 times daily.  Massage cream into incision for one minute with each application.  Use sunscreen (50 SPF or higher) for first 6 months after surgery if area is exposed to sun.  You may substitute Mederma or other scar reducing creams as desired.   

## 2012-11-11 NOTE — Progress Notes (Signed)
General Surgery [facility]  Visit Diagnoses: 1. Multiple thyroid nodules     HISTORY: Patient returns for her first postoperative visit having undergone total thyroidectomy for bilateral thyroid nodules. Final pathology shows adenomatous nodules arising in a background of chronic thyroiditis. No malignancy was identified. Patient is currently taking Synthroid 88 mcg daily. A serum calcium level from earlier this morning is pending at this time.  EXAM: Surgical incision has healed nicely. Remaining Steri-Strips are removed. No sign of seroma. No sign of infection. Voice quality is normal.  IMPRESSION: Status post total thyroidectomy  PLAN: Patient will continue on Synthroid 88 mcg daily. We will check a TSH level before her next office visit in 6 weeks. She will begin applying topical creams to her incision. We will notify her with the results of her calcium level. If it is normal, she may discontinue calcium supplements.  Velora Heckler, MD, FACS General & Endocrine Surgery [facility]

## 2012-11-12 ENCOUNTER — Telehealth (INDEPENDENT_AMBULATORY_CARE_PROVIDER_SITE_OTHER): Payer: Self-pay

## 2012-11-12 NOTE — Telephone Encounter (Signed)
Pt notified of lab results = normal value.

## 2012-11-21 DIAGNOSIS — Z309 Encounter for contraceptive management, unspecified: Secondary | ICD-10-CM

## 2012-11-21 DIAGNOSIS — K219 Gastro-esophageal reflux disease without esophagitis: Secondary | ICD-10-CM | POA: Insufficient documentation

## 2012-11-21 HISTORY — DX: Encounter for contraceptive management, unspecified: Z30.9

## 2012-12-16 ENCOUNTER — Encounter (INDEPENDENT_AMBULATORY_CARE_PROVIDER_SITE_OTHER): Payer: Self-pay

## 2012-12-30 ENCOUNTER — Ambulatory Visit (INDEPENDENT_AMBULATORY_CARE_PROVIDER_SITE_OTHER): Payer: Medicare Other | Admitting: Surgery

## 2012-12-30 ENCOUNTER — Encounter (INDEPENDENT_AMBULATORY_CARE_PROVIDER_SITE_OTHER): Payer: Self-pay | Admitting: Surgery

## 2012-12-30 VITALS — BP 140/68 | HR 67 | Temp 98.6°F | Resp 18 | Ht 65.0 in | Wt 150.0 lb

## 2012-12-30 DIAGNOSIS — E890 Postprocedural hypothyroidism: Secondary | ICD-10-CM | POA: Insufficient documentation

## 2012-12-30 DIAGNOSIS — E042 Nontoxic multinodular goiter: Secondary | ICD-10-CM

## 2012-12-30 MED ORDER — SYNTHROID 88 MCG PO TABS: 88.0000 ug | ORAL_TABLET | Freq: Every day | ORAL | Status: DC

## 2012-12-30 NOTE — Progress Notes (Signed)
General Surgery [facility]  Visit Diagnoses: 1. Multiple thyroid nodules   2. Hypothyroidism, postsurgical     HISTORY: Patient returns for her second postoperative visit having undergone total thyroidectomy. At my request she had a TSH level drawn on 12/27/2012. Patient had changed her thyroid hormone dosage to 75 mcg of Synthroid daily approximately 4 weeks prior to this laboratory study. Her TSH level at this time is 8.110.  Patient is applying topical creams to her incision. She did have a cutaneous reaction to the Palmer's cocoa butter and has discontinued using that product.  EXAM: Surgical incision is nicely healed. Soft tissue swelling has largely resolved. No sign of infection. No sign of seroma. Voice quality is normal.  IMPRESSION: Status post total thyroidectomy for benign thyroid nodules  PLAN: Patient requires a larger dose of Synthroid. I am going to renew her prescription for Synthroid 88 mcg daily. I would like to see a TSH level in 4-6 weeks. My goal would be to keep her TSH level between 1.0 and 2.0 on a long-term basis.  We will communicate this change to her new primary care physician. I will ask her to assume management following the next laboratory results.  Patient will return to our practice for surgical care as needed.  Velora Heckler, MD, FACS General & Endocrine Surgery [facility]

## 2012-12-30 NOTE — Patient Instructions (Signed)

## 2013-01-21 ENCOUNTER — Encounter (INDEPENDENT_AMBULATORY_CARE_PROVIDER_SITE_OTHER): Payer: Self-pay

## 2013-02-11 ENCOUNTER — Telehealth (INDEPENDENT_AMBULATORY_CARE_PROVIDER_SITE_OTHER): Payer: Self-pay

## 2013-02-11 NOTE — Telephone Encounter (Signed)
Patient called in asking for lab results from yesterday. I looked and could not find any results in Dr Ardine Eng box. I told patient I would send him and his nurse a message and they would call patient once they have and review them.

## 2013-02-12 NOTE — Telephone Encounter (Signed)
I called [facility] and they cannot locate tsh result. They verified pt did come to [place] location to have lab done and it was drawn but they are unable to track it in system. [facility] will continue tracking and fax result asap. Pt advised.

## 2013-02-14 ENCOUNTER — Telehealth (INDEPENDENT_AMBULATORY_CARE_PROVIDER_SITE_OTHER): Payer: Self-pay

## 2013-02-14 NOTE — Telephone Encounter (Signed)
I called [facility] again today to see if TSH located that was drawn 02-10-13. Per Dionna at [facility] they are still trying to locate labs. I called pt. Pt is very concerned that she may need to have synthroid level adjusted. I advised her if we have not located labs by Monday 6-16 we will ask [facility] not to bill pt and will have labs repeated at [facility] lab. Pt states she understands and agrees with this plan.

## 2013-02-17 ENCOUNTER — Other Ambulatory Visit (INDEPENDENT_AMBULATORY_CARE_PROVIDER_SITE_OTHER): Payer: Self-pay | Admitting: Surgery

## 2013-02-17 ENCOUNTER — Other Ambulatory Visit (INDEPENDENT_AMBULATORY_CARE_PROVIDER_SITE_OTHER): Payer: Self-pay

## 2013-02-17 ENCOUNTER — Telehealth (INDEPENDENT_AMBULATORY_CARE_PROVIDER_SITE_OTHER): Payer: Self-pay

## 2013-02-17 DIAGNOSIS — E039 Hypothyroidism, unspecified: Secondary | ICD-10-CM

## 2013-02-17 LAB — TSH: TSH: 3.641 u[IU]/mL (ref 0.350–4.500)

## 2013-02-17 NOTE — Telephone Encounter (Signed)
I spoke with Alyssa Fisher at [facility]. TSH specimen has still not been found only notation that pt went to [address] location to have lab drawn 02-10-13. I have asked them to not charge pt for lab. We are sending pt per her request to another lab to have TSH done. LMOM for pt to go to [facility] for TSH. I have faxed order to [facility] and kept copy of order on my cart until result comes in.

## 2013-02-19 ENCOUNTER — Other Ambulatory Visit (INDEPENDENT_AMBULATORY_CARE_PROVIDER_SITE_OTHER): Payer: Self-pay

## 2013-02-19 ENCOUNTER — Telehealth (INDEPENDENT_AMBULATORY_CARE_PROVIDER_SITE_OTHER): Payer: Self-pay

## 2013-02-19 DIAGNOSIS — E890 Postprocedural hypothyroidism: Secondary | ICD-10-CM

## 2013-02-19 DIAGNOSIS — E039 Hypothyroidism, unspecified: Secondary | ICD-10-CM

## 2013-02-19 MED ORDER — LEVOTHYROXINE SODIUM 100 MCG PO TABS: 100.0000 ug | ORAL_TABLET | Freq: Every day | ORAL | Status: DC

## 2013-02-19 MED ORDER — CALCIUM CARBONATE 1250 (500 CA) MG PO CAPS: 1250.0000 mg | ORAL_CAPSULE | Freq: Three times a day (TID) | ORAL | Status: DC

## 2013-02-19 NOTE — Telephone Encounter (Signed)
Per Dr Ardine Eng order synthroid #30 one po Q day, refill x 6 called to [facility]. Pt advised to d/c synthroid and begin due to lab results. Pt to have TSH repeated in 6 weeks. Lab slip mailed to pt. Pt states she understands.

## 2013-02-19 NOTE — Telephone Encounter (Signed)
Message copied by Joanette Gula on Wed Feb 19, 2013  9:20 AM ------      Message from: Velora Heckler      Created: Tue Feb 18, 2013  2:31 PM       Cindy,            Let's increase her dose to 100 mcg of Synthroid daily.  Have her get a TSH level after she is on the new dosage for 6 weeks.  Let her know her level is in the normal range now, but not yet optimal.            Thanks,            tmg ------

## 2013-03-31 ENCOUNTER — Other Ambulatory Visit (INDEPENDENT_AMBULATORY_CARE_PROVIDER_SITE_OTHER): Payer: Self-pay | Admitting: Surgery

## 2013-03-31 LAB — TSH: TSH: 1.073 u[IU]/mL (ref 0.350–4.500)

## 2013-04-01 ENCOUNTER — Telehealth (INDEPENDENT_AMBULATORY_CARE_PROVIDER_SITE_OTHER): Payer: Self-pay

## 2013-04-01 NOTE — Telephone Encounter (Signed)
Message copied by Joanette Gula on Tue Apr 01, 2013 11:37 AM ------      Message from: Velora Heckler      Created: Tue Apr 01, 2013 11:15 AM       TSH is perfect on her dosage of Synthroid 100 mcg daily.  Continue on this dosage.            Please send TSH level results to primary MD.            Velora Heckler, MD, [facility]      Office: [phone]             ------

## 2013-04-01 NOTE — Telephone Encounter (Signed)
LMOM for pt to call. Per attached note TSH is perfect on current synthroid dose. Will fax copy to PCP.

## 2013-04-01 NOTE — Telephone Encounter (Signed)
Pt returning call. I notified pt of the message that the TSH is perfect and stay on same dosage of Synthroid. The pt understands.

## 2013-06-19 NOTE — Telephone Encounter (Signed)
error 

## 2013-09-01 ENCOUNTER — Telehealth (INDEPENDENT_AMBULATORY_CARE_PROVIDER_SITE_OTHER): Payer: Self-pay

## 2013-09-01 NOTE — Telephone Encounter (Signed)
Refill request for Synthroid declined by Dr Gerrit Friends with note to fax to pts pcp. Faxed back to [facility] [phone].

## 2013-09-08 ENCOUNTER — Telehealth (INDEPENDENT_AMBULATORY_CARE_PROVIDER_SITE_OTHER): Payer: Self-pay

## 2013-09-08 NOTE — Telephone Encounter (Signed)
Thyroid hormone replacement is being managed by her primary care provider.  Alyssa Regal, MD, [facility] Office: [phone]

## 2013-09-08 NOTE — Telephone Encounter (Signed)
Patient asking for refill of synthroid 100 mcg called to [facility] please advise

## 2013-09-08 NOTE — Telephone Encounter (Signed)
Pt advised and will f/u with PCP.

## 2013-09-09 DIAGNOSIS — F419 Anxiety disorder, unspecified: Secondary | ICD-10-CM | POA: Insufficient documentation

## 2013-12-16 DIAGNOSIS — B351 Tinea unguium: Secondary | ICD-10-CM

## 2013-12-16 DIAGNOSIS — N898 Other specified noninflammatory disorders of vagina: Secondary | ICD-10-CM | POA: Insufficient documentation

## 2013-12-16 DIAGNOSIS — R6882 Decreased libido: Secondary | ICD-10-CM | POA: Insufficient documentation

## 2013-12-16 HISTORY — DX: Tinea unguium: B35.1

## 2015-06-08 ENCOUNTER — Other Ambulatory Visit: Payer: Self-pay | Admitting: Family Medicine

## 2015-06-08 DIAGNOSIS — K219 Gastro-esophageal reflux disease without esophagitis: Secondary | ICD-10-CM

## 2015-06-09 ENCOUNTER — Ambulatory Visit
Admission: RE | Admit: 2015-06-09 | Discharge: 2015-06-09 | Disposition: A | Discharge status: Home / Self Care | Payer: Commercial Managed Care - HMO | Source: Ambulatory Visit | Attending: Family Medicine | Admitting: Family Medicine

## 2015-06-09 ENCOUNTER — Encounter (INDEPENDENT_AMBULATORY_CARE_PROVIDER_SITE_OTHER): Payer: Self-pay

## 2015-06-09 DIAGNOSIS — K219 Gastro-esophageal reflux disease without esophagitis: Secondary | ICD-10-CM

## 2015-09-07 DIAGNOSIS — J301 Allergic rhinitis due to pollen: Secondary | ICD-10-CM | POA: Diagnosis not present

## 2015-09-08 DIAGNOSIS — E039 Hypothyroidism, unspecified: Secondary | ICD-10-CM | POA: Diagnosis not present

## 2015-09-10 DIAGNOSIS — E039 Hypothyroidism, unspecified: Secondary | ICD-10-CM | POA: Diagnosis not present

## 2015-09-10 DIAGNOSIS — M722 Plantar fascial fibromatosis: Secondary | ICD-10-CM | POA: Diagnosis not present

## 2015-09-10 DIAGNOSIS — M76892 Other specified enthesopathies of left lower limb, excluding foot: Secondary | ICD-10-CM | POA: Diagnosis not present

## 2015-09-14 DIAGNOSIS — J301 Allergic rhinitis due to pollen: Secondary | ICD-10-CM | POA: Diagnosis not present

## 2015-09-20 ENCOUNTER — Other Ambulatory Visit: Payer: Self-pay | Admitting: Family Medicine

## 2015-09-20 DIAGNOSIS — N6320 Unspecified lump in the left breast, unspecified quadrant: Secondary | ICD-10-CM

## 2015-09-20 DIAGNOSIS — N63 Unspecified lump in breast: Secondary | ICD-10-CM | POA: Diagnosis not present

## 2015-09-21 DIAGNOSIS — J301 Allergic rhinitis due to pollen: Secondary | ICD-10-CM | POA: Diagnosis not present

## 2015-09-24 ENCOUNTER — Ambulatory Visit
Admission: RE | Admit: 2015-09-24 | Discharge: 2015-09-24 | Disposition: A | Discharge status: Home / Self Care | Payer: PPO | Source: Ambulatory Visit | Attending: Family Medicine | Admitting: Family Medicine

## 2015-09-24 ENCOUNTER — Other Ambulatory Visit: Payer: Self-pay | Admitting: Family Medicine

## 2015-09-24 DIAGNOSIS — N6320 Unspecified lump in the left breast, unspecified quadrant: Secondary | ICD-10-CM

## 2015-09-24 DIAGNOSIS — N6012 Diffuse cystic mastopathy of left breast: Secondary | ICD-10-CM | POA: Diagnosis not present

## 2015-09-24 DIAGNOSIS — N63 Unspecified lump in breast: Secondary | ICD-10-CM | POA: Diagnosis not present

## 2015-09-28 ENCOUNTER — Other Ambulatory Visit: Payer: PPO

## 2015-09-28 DIAGNOSIS — J301 Allergic rhinitis due to pollen: Secondary | ICD-10-CM | POA: Diagnosis not present

## 2015-09-30 ENCOUNTER — Ambulatory Visit
Admission: RE | Admit: 2015-09-30 | Discharge: 2015-09-30 | Disposition: A | Discharge status: Home / Self Care | Payer: PPO | Source: Ambulatory Visit | Attending: Family Medicine | Admitting: Family Medicine

## 2015-09-30 DIAGNOSIS — N6320 Unspecified lump in the left breast, unspecified quadrant: Secondary | ICD-10-CM

## 2015-09-30 DIAGNOSIS — N6002 Solitary cyst of left breast: Secondary | ICD-10-CM | POA: Diagnosis not present

## 2015-10-04 DIAGNOSIS — D225 Melanocytic nevi of trunk: Secondary | ICD-10-CM | POA: Diagnosis not present

## 2015-10-04 DIAGNOSIS — L700 Acne vulgaris: Secondary | ICD-10-CM | POA: Diagnosis not present

## 2015-10-04 DIAGNOSIS — Z1283 Encounter for screening for malignant neoplasm of skin: Secondary | ICD-10-CM | POA: Diagnosis not present

## 2015-10-04 DIAGNOSIS — B078 Other viral warts: Secondary | ICD-10-CM | POA: Diagnosis not present

## 2015-10-05 DIAGNOSIS — H5360 Unspecified night blindness: Secondary | ICD-10-CM | POA: Diagnosis not present

## 2015-10-05 DIAGNOSIS — J301 Allergic rhinitis due to pollen: Secondary | ICD-10-CM | POA: Diagnosis not present

## 2015-10-05 DIAGNOSIS — H53483 Generalized contraction of visual field, bilateral: Secondary | ICD-10-CM | POA: Diagnosis not present

## 2015-10-05 DIAGNOSIS — H3552 Pigmentary retinal dystrophy: Secondary | ICD-10-CM | POA: Diagnosis not present

## 2015-10-05 DIAGNOSIS — J3089 Other allergic rhinitis: Secondary | ICD-10-CM | POA: Diagnosis not present

## 2015-10-05 DIAGNOSIS — H542 Low vision, both eyes: Secondary | ICD-10-CM | POA: Diagnosis not present

## 2015-10-05 DIAGNOSIS — H5372 Impaired contrast sensitivity: Secondary | ICD-10-CM | POA: Diagnosis not present

## 2015-10-11 DIAGNOSIS — H53483 Generalized contraction of visual field, bilateral: Secondary | ICD-10-CM | POA: Diagnosis not present

## 2015-10-11 DIAGNOSIS — H542 Low vision, both eyes: Secondary | ICD-10-CM | POA: Diagnosis not present

## 2015-10-11 DIAGNOSIS — H5360 Unspecified night blindness: Secondary | ICD-10-CM | POA: Diagnosis not present

## 2015-10-11 DIAGNOSIS — H3552 Pigmentary retinal dystrophy: Secondary | ICD-10-CM | POA: Diagnosis not present

## 2015-10-11 DIAGNOSIS — H5372 Impaired contrast sensitivity: Secondary | ICD-10-CM | POA: Diagnosis not present

## 2015-10-12 DIAGNOSIS — J301 Allergic rhinitis due to pollen: Secondary | ICD-10-CM | POA: Diagnosis not present

## 2015-10-13 DIAGNOSIS — J019 Acute sinusitis, unspecified: Secondary | ICD-10-CM | POA: Diagnosis not present

## 2015-10-20 DIAGNOSIS — J301 Allergic rhinitis due to pollen: Secondary | ICD-10-CM | POA: Diagnosis not present

## 2015-10-21 DIAGNOSIS — H5372 Impaired contrast sensitivity: Secondary | ICD-10-CM | POA: Diagnosis not present

## 2015-10-21 DIAGNOSIS — H53483 Generalized contraction of visual field, bilateral: Secondary | ICD-10-CM | POA: Diagnosis not present

## 2015-10-21 DIAGNOSIS — H5360 Unspecified night blindness: Secondary | ICD-10-CM | POA: Diagnosis not present

## 2015-10-21 DIAGNOSIS — H542 Low vision, both eyes: Secondary | ICD-10-CM | POA: Diagnosis not present

## 2015-10-21 DIAGNOSIS — H3552 Pigmentary retinal dystrophy: Secondary | ICD-10-CM | POA: Diagnosis not present

## 2015-10-26 DIAGNOSIS — J301 Allergic rhinitis due to pollen: Secondary | ICD-10-CM | POA: Diagnosis not present

## 2015-10-28 DIAGNOSIS — H53483 Generalized contraction of visual field, bilateral: Secondary | ICD-10-CM | POA: Diagnosis not present

## 2015-10-28 DIAGNOSIS — H5360 Unspecified night blindness: Secondary | ICD-10-CM | POA: Diagnosis not present

## 2015-10-28 DIAGNOSIS — H3552 Pigmentary retinal dystrophy: Secondary | ICD-10-CM | POA: Diagnosis not present

## 2015-10-28 DIAGNOSIS — H542 Low vision, both eyes: Secondary | ICD-10-CM | POA: Diagnosis not present

## 2015-10-28 DIAGNOSIS — H5372 Impaired contrast sensitivity: Secondary | ICD-10-CM | POA: Diagnosis not present

## 2015-11-02 DIAGNOSIS — H5360 Unspecified night blindness: Secondary | ICD-10-CM | POA: Diagnosis not present

## 2015-11-02 DIAGNOSIS — H542 Low vision, both eyes: Secondary | ICD-10-CM | POA: Diagnosis not present

## 2015-11-02 DIAGNOSIS — H5372 Impaired contrast sensitivity: Secondary | ICD-10-CM | POA: Diagnosis not present

## 2015-11-02 DIAGNOSIS — H3552 Pigmentary retinal dystrophy: Secondary | ICD-10-CM | POA: Diagnosis not present

## 2015-11-02 DIAGNOSIS — E039 Hypothyroidism, unspecified: Secondary | ICD-10-CM | POA: Diagnosis not present

## 2015-11-02 DIAGNOSIS — H53483 Generalized contraction of visual field, bilateral: Secondary | ICD-10-CM | POA: Diagnosis not present

## 2015-11-02 DIAGNOSIS — J301 Allergic rhinitis due to pollen: Secondary | ICD-10-CM | POA: Diagnosis not present

## 2015-11-04 DIAGNOSIS — E039 Hypothyroidism, unspecified: Secondary | ICD-10-CM | POA: Diagnosis not present

## 2015-11-04 DIAGNOSIS — J309 Allergic rhinitis, unspecified: Secondary | ICD-10-CM | POA: Diagnosis not present

## 2015-11-08 DIAGNOSIS — H5360 Unspecified night blindness: Secondary | ICD-10-CM | POA: Diagnosis not present

## 2015-11-08 DIAGNOSIS — H542 Low vision, both eyes: Secondary | ICD-10-CM | POA: Diagnosis not present

## 2015-11-08 DIAGNOSIS — H5372 Impaired contrast sensitivity: Secondary | ICD-10-CM | POA: Diagnosis not present

## 2015-11-08 DIAGNOSIS — H3552 Pigmentary retinal dystrophy: Secondary | ICD-10-CM | POA: Diagnosis not present

## 2015-11-08 DIAGNOSIS — H53483 Generalized contraction of visual field, bilateral: Secondary | ICD-10-CM | POA: Diagnosis not present

## 2015-11-09 DIAGNOSIS — J301 Allergic rhinitis due to pollen: Secondary | ICD-10-CM | POA: Diagnosis not present

## 2015-11-15 DIAGNOSIS — H5372 Impaired contrast sensitivity: Secondary | ICD-10-CM | POA: Diagnosis not present

## 2015-11-15 DIAGNOSIS — H542 Low vision, both eyes: Secondary | ICD-10-CM | POA: Diagnosis not present

## 2015-11-15 DIAGNOSIS — H5360 Unspecified night blindness: Secondary | ICD-10-CM | POA: Diagnosis not present

## 2015-11-15 DIAGNOSIS — H53483 Generalized contraction of visual field, bilateral: Secondary | ICD-10-CM | POA: Diagnosis not present

## 2015-11-15 DIAGNOSIS — H3552 Pigmentary retinal dystrophy: Secondary | ICD-10-CM | POA: Diagnosis not present

## 2015-11-16 DIAGNOSIS — J301 Allergic rhinitis due to pollen: Secondary | ICD-10-CM | POA: Diagnosis not present

## 2015-11-18 DIAGNOSIS — J301 Allergic rhinitis due to pollen: Secondary | ICD-10-CM | POA: Diagnosis not present

## 2015-11-23 DIAGNOSIS — J301 Allergic rhinitis due to pollen: Secondary | ICD-10-CM | POA: Diagnosis not present

## 2015-11-25 DIAGNOSIS — H53483 Generalized contraction of visual field, bilateral: Secondary | ICD-10-CM | POA: Diagnosis not present

## 2015-11-25 DIAGNOSIS — J301 Allergic rhinitis due to pollen: Secondary | ICD-10-CM | POA: Diagnosis not present

## 2015-11-25 DIAGNOSIS — H5372 Impaired contrast sensitivity: Secondary | ICD-10-CM | POA: Diagnosis not present

## 2015-11-25 DIAGNOSIS — H542 Low vision, both eyes: Secondary | ICD-10-CM | POA: Diagnosis not present

## 2015-11-25 DIAGNOSIS — H5360 Unspecified night blindness: Secondary | ICD-10-CM | POA: Diagnosis not present

## 2015-11-25 DIAGNOSIS — H3552 Pigmentary retinal dystrophy: Secondary | ICD-10-CM | POA: Diagnosis not present

## 2015-11-30 DIAGNOSIS — J301 Allergic rhinitis due to pollen: Secondary | ICD-10-CM | POA: Diagnosis not present

## 2015-12-02 DIAGNOSIS — H542 Low vision, both eyes: Secondary | ICD-10-CM | POA: Diagnosis not present

## 2015-12-02 DIAGNOSIS — H5372 Impaired contrast sensitivity: Secondary | ICD-10-CM | POA: Diagnosis not present

## 2015-12-02 DIAGNOSIS — H5360 Unspecified night blindness: Secondary | ICD-10-CM | POA: Diagnosis not present

## 2015-12-02 DIAGNOSIS — H3552 Pigmentary retinal dystrophy: Secondary | ICD-10-CM | POA: Diagnosis not present

## 2015-12-02 DIAGNOSIS — H53483 Generalized contraction of visual field, bilateral: Secondary | ICD-10-CM | POA: Diagnosis not present

## 2015-12-07 DIAGNOSIS — J301 Allergic rhinitis due to pollen: Secondary | ICD-10-CM | POA: Diagnosis not present

## 2015-12-13 DIAGNOSIS — J301 Allergic rhinitis due to pollen: Secondary | ICD-10-CM | POA: Diagnosis not present

## 2015-12-14 DIAGNOSIS — H5372 Impaired contrast sensitivity: Secondary | ICD-10-CM | POA: Diagnosis not present

## 2015-12-14 DIAGNOSIS — H542 Low vision, both eyes: Secondary | ICD-10-CM | POA: Diagnosis not present

## 2015-12-14 DIAGNOSIS — H53483 Generalized contraction of visual field, bilateral: Secondary | ICD-10-CM | POA: Diagnosis not present

## 2015-12-14 DIAGNOSIS — H5360 Unspecified night blindness: Secondary | ICD-10-CM | POA: Diagnosis not present

## 2015-12-14 DIAGNOSIS — H3552 Pigmentary retinal dystrophy: Secondary | ICD-10-CM | POA: Diagnosis not present

## 2015-12-21 DIAGNOSIS — J301 Allergic rhinitis due to pollen: Secondary | ICD-10-CM | POA: Diagnosis not present

## 2015-12-28 DIAGNOSIS — M539 Dorsopathy, unspecified: Secondary | ICD-10-CM | POA: Diagnosis not present

## 2015-12-28 DIAGNOSIS — J301 Allergic rhinitis due to pollen: Secondary | ICD-10-CM | POA: Diagnosis not present

## 2016-01-04 DIAGNOSIS — J301 Allergic rhinitis due to pollen: Secondary | ICD-10-CM | POA: Diagnosis not present

## 2016-01-11 DIAGNOSIS — J301 Allergic rhinitis due to pollen: Secondary | ICD-10-CM | POA: Diagnosis not present

## 2016-01-11 DIAGNOSIS — E039 Hypothyroidism, unspecified: Secondary | ICD-10-CM | POA: Diagnosis not present

## 2016-01-13 DIAGNOSIS — E039 Hypothyroidism, unspecified: Secondary | ICD-10-CM | POA: Diagnosis not present

## 2016-01-13 DIAGNOSIS — M539 Dorsopathy, unspecified: Secondary | ICD-10-CM | POA: Diagnosis not present

## 2016-01-13 DIAGNOSIS — F411 Generalized anxiety disorder: Secondary | ICD-10-CM | POA: Diagnosis not present

## 2016-01-13 DIAGNOSIS — R030 Elevated blood-pressure reading, without diagnosis of hypertension: Secondary | ICD-10-CM | POA: Diagnosis not present

## 2016-01-17 DIAGNOSIS — J301 Allergic rhinitis due to pollen: Secondary | ICD-10-CM | POA: Diagnosis not present

## 2016-01-27 DIAGNOSIS — J301 Allergic rhinitis due to pollen: Secondary | ICD-10-CM | POA: Diagnosis not present

## 2016-02-01 DIAGNOSIS — J301 Allergic rhinitis due to pollen: Secondary | ICD-10-CM | POA: Diagnosis not present

## 2016-02-08 DIAGNOSIS — J301 Allergic rhinitis due to pollen: Secondary | ICD-10-CM | POA: Diagnosis not present

## 2016-02-14 DIAGNOSIS — J301 Allergic rhinitis due to pollen: Secondary | ICD-10-CM | POA: Diagnosis not present

## 2016-02-22 DIAGNOSIS — Z0000 Encounter for general adult medical examination without abnormal findings: Secondary | ICD-10-CM | POA: Diagnosis not present

## 2016-02-22 DIAGNOSIS — H1045 Other chronic allergic conjunctivitis: Secondary | ICD-10-CM | POA: Diagnosis not present

## 2016-02-22 DIAGNOSIS — Z23 Encounter for immunization: Secondary | ICD-10-CM | POA: Diagnosis not present

## 2016-02-22 DIAGNOSIS — J019 Acute sinusitis, unspecified: Secondary | ICD-10-CM | POA: Diagnosis not present

## 2016-02-22 DIAGNOSIS — R0602 Shortness of breath: Secondary | ICD-10-CM | POA: Diagnosis not present

## 2016-02-22 DIAGNOSIS — J301 Allergic rhinitis due to pollen: Secondary | ICD-10-CM | POA: Diagnosis not present

## 2016-02-23 ENCOUNTER — Encounter: Payer: Self-pay | Admitting: Gastroenterology

## 2016-02-29 DIAGNOSIS — J301 Allergic rhinitis due to pollen: Secondary | ICD-10-CM | POA: Diagnosis not present

## 2016-03-01 DIAGNOSIS — E039 Hypothyroidism, unspecified: Secondary | ICD-10-CM | POA: Diagnosis not present

## 2016-03-03 DIAGNOSIS — E039 Hypothyroidism, unspecified: Secondary | ICD-10-CM | POA: Diagnosis not present

## 2016-03-08 DIAGNOSIS — J301 Allergic rhinitis due to pollen: Secondary | ICD-10-CM | POA: Diagnosis not present

## 2016-03-16 DIAGNOSIS — J301 Allergic rhinitis due to pollen: Secondary | ICD-10-CM | POA: Diagnosis not present

## 2016-03-20 DIAGNOSIS — J301 Allergic rhinitis due to pollen: Secondary | ICD-10-CM | POA: Diagnosis not present

## 2016-03-28 DIAGNOSIS — J301 Allergic rhinitis due to pollen: Secondary | ICD-10-CM | POA: Diagnosis not present

## 2016-04-03 DIAGNOSIS — J301 Allergic rhinitis due to pollen: Secondary | ICD-10-CM | POA: Diagnosis not present

## 2016-04-12 DIAGNOSIS — J301 Allergic rhinitis due to pollen: Secondary | ICD-10-CM | POA: Diagnosis not present

## 2016-04-14 ENCOUNTER — Encounter: Payer: Self-pay | Admitting: Gastroenterology

## 2016-04-14 ENCOUNTER — Ambulatory Visit (AMBULATORY_SURGERY_CENTER): Payer: Self-pay

## 2016-04-14 VITALS — Ht 65.0 in | Wt 171.2 lb

## 2016-04-14 DIAGNOSIS — Z800 Family history of malignant neoplasm of digestive organs: Secondary | ICD-10-CM

## 2016-04-14 MED ORDER — SUPREP BOWEL PREP KIT 17.5-3.13-1.6 GM/177ML PO SOLN: 1.0000 | Freq: Once | ORAL | 0 refills | Status: AC

## 2016-04-14 NOTE — Progress Notes (Signed)
No allergies to eggs or soy No past problems with anesthesia No diet meds No home oxygen  Has email and internet; declined emmi 

## 2016-04-18 DIAGNOSIS — J301 Allergic rhinitis due to pollen: Secondary | ICD-10-CM | POA: Diagnosis not present

## 2016-04-20 DIAGNOSIS — J301 Allergic rhinitis due to pollen: Secondary | ICD-10-CM | POA: Diagnosis not present

## 2016-04-25 DIAGNOSIS — J301 Allergic rhinitis due to pollen: Secondary | ICD-10-CM | POA: Diagnosis not present

## 2016-04-27 DIAGNOSIS — J301 Allergic rhinitis due to pollen: Secondary | ICD-10-CM | POA: Diagnosis not present

## 2016-04-28 ENCOUNTER — Encounter: Payer: Self-pay | Admitting: Gastroenterology

## 2016-04-28 ENCOUNTER — Ambulatory Visit (AMBULATORY_SURGERY_CENTER): Payer: PPO | Admitting: Gastroenterology

## 2016-04-28 VITALS — BP 135/82 | HR 72 | Temp 97.8°F | Resp 16 | Ht 65.0 in | Wt 171.0 lb

## 2016-04-28 DIAGNOSIS — K621 Rectal polyp: Secondary | ICD-10-CM | POA: Diagnosis not present

## 2016-04-28 DIAGNOSIS — Z1211 Encounter for screening for malignant neoplasm of colon: Secondary | ICD-10-CM

## 2016-04-28 DIAGNOSIS — D128 Benign neoplasm of rectum: Secondary | ICD-10-CM

## 2016-04-28 DIAGNOSIS — D129 Benign neoplasm of anus and anal canal: Secondary | ICD-10-CM

## 2016-04-28 DIAGNOSIS — E039 Hypothyroidism, unspecified: Secondary | ICD-10-CM | POA: Diagnosis not present

## 2016-04-28 DIAGNOSIS — K219 Gastro-esophageal reflux disease without esophagitis: Secondary | ICD-10-CM | POA: Diagnosis not present

## 2016-04-28 DIAGNOSIS — Z800 Family history of malignant neoplasm of digestive organs: Secondary | ICD-10-CM | POA: Diagnosis not present

## 2016-04-28 DIAGNOSIS — F419 Anxiety disorder, unspecified: Secondary | ICD-10-CM | POA: Diagnosis not present

## 2016-04-28 DIAGNOSIS — E669 Obesity, unspecified: Secondary | ICD-10-CM | POA: Diagnosis not present

## 2016-04-28 MED ORDER — SODIUM CHLORIDE 0.9 % IV SOLN: 500.0000 mL | INTRAVENOUS | Status: DC

## 2016-04-28 NOTE — Patient Instructions (Signed)
YOU HAD AN ENDOSCOPIC PROCEDURE TODAY AT [facility]:   Refer to the procedure report that was given to you for any specific questions about what was found during the examination.  If the procedure report does not answer your questions, please call your gastroenterologist to clarify.  If you requested that your care partner not be given the details of your procedure findings, then the procedure report has been included in a sealed envelope for you to review at your convenience later.  YOU SHOULD EXPECT: Some feelings of bloating in the abdomen. Passage of more gas than usual.  Walking can help get rid of the air that was put into your GI tract during the procedure and reduce the bloating. If you had a lower endoscopy (such as a colonoscopy or flexible sigmoidoscopy) you may notice spotting of blood in your stool or on the toilet paper. If you underwent a bowel prep for your procedure, you may not have a normal bowel movement for a few days.  Please Note:  You might notice some irritation and congestion in your nose or some drainage.  This is from the oxygen used during your procedure.  There is no need for concern and it should clear up in a day or so.  SYMPTOMS TO REPORT IMMEDIATELY:   Following lower endoscopy (colonoscopy or flexible sigmoidoscopy):  Excessive amounts of blood in the stool  Significant tenderness or worsening of abdominal pains  Swelling of the abdomen that is new, acute  Fever of 100F or higher   For urgent or emergent issues, a gastroenterologist can be reached at any hour by calling [phone].   DIET:  We do recommend a small meal at first, but then you may proceed to your regular diet.  Drink plenty of fluids but you should avoid alcoholic beverages for 24 hours.  ACTIVITY:  You should plan to take it easy for the rest of today and you should NOT DRIVE or use heavy machinery until tomorrow (because of the sedation medicines used during the test).     FOLLOW UP: Our staff will call the number listed on your records the next business day following your procedure to check on you and address any questions or concerns that you may have regarding the information given to you following your procedure. If we do not reach you, we will leave a message.  However, if you are feeling well and you are not experiencing any problems, there is no need to return our call.  We will assume that you have returned to your regular daily activities without incident.  If any biopsies were taken you will be contacted by phone or by letter within the next 1-3 weeks.  Please call us at [phone] if you have not heard about the biopsies in 3 weeks.    SIGNATURES/CONFIDENTIALITY: You and/or your care partner have signed paperwork which will be entered into your electronic medical record.  These signatures attest to the fact that that the information above on your After Visit Summary has been reviewed and is understood.  Full responsibility of the confidentiality of this discharge information lies with you and/or your care-partner.  Polyps, diverticulosis-handouts given  Repeat colonoscopy will be determined by pathology.

## 2016-04-28 NOTE — Op Note (Signed)
[place] Patient Name: Alyssa Fisher Procedure Date: 04/28/2016 8:27 AM MRN: [id number] Endoscopist: Doolittle. Loletha Carrow , MD Age: 50 Referring MD:  Date of Birth: 1966-04-18 Gender: Female Account #: [id number] Procedure:                Colonoscopy Indications:              Screening for colorectal malignant neoplasm, This                            is the patient's first colonoscopy Medicines:                Monitored Anesthesia Care Procedure:                Pre-Anesthesia Assessment:                           - Prior to the procedure, a History and Physical                            was performed, and patient medications and                            allergies were reviewed. The patient's tolerance of                            previous anesthesia was also reviewed. The risks                            and benefits of the procedure and the sedation                            options and risks were discussed with the patient.                            All questions were answered, and informed consent                            was obtained. Prior Anticoagulants: The patient has                            taken no previous anticoagulant or antiplatelet                            agents. ASA Grade Assessment: II - A patient with                            mild systemic disease. After reviewing the risks                            and benefits, the patient was deemed in                            satisfactory condition to undergo the procedure.  After obtaining informed consent, the colonoscope                            was passed under direct vision. Throughout the                            procedure, the patient's blood pressure, pulse, and                            oxygen saturations were monitored continuously. The                            Model CF-HQ190L [id number]) scope was introduced                            through the anus and advanced  to the the cecum,                            identified by appendiceal orifice and ileocecal                            valve. The colonoscopy was performed without                            difficulty. The patient tolerated the procedure                            well. The quality of the bowel preparation was                            excellent. The ileocecal valve, appendiceal                            orifice, and rectum were photographed. The quality                            of the bowel preparation was evaluated using the                            BBPS [facility] Bowel Preparation Scale) with scores                            of: Right Colon = 3, Transverse Colon = 3 and Left                            Colon = 3 (entire mucosa seen well with no residual                            staining, small fragments of stool or opaque                            liquid). The total BBPS score equals 9. The bowel  preparation used was SUPREP. Scope In: 8:31:14 AM Scope Out: 8:44:15 AM Scope Withdrawal Time: 0 hours 10 minutes 24 seconds  Total Procedure Duration: 0 hours 13 minutes 1 second  Findings:                 The perianal and digital rectal examinations were                            normal.                           A 2 mm polyp was found in the rectum. The polyp was                            sessile. The polyp was removed with a cold biopsy                            forceps. Resection and retrieval were complete.                           The exam was otherwise without abnormality on                            direct and retroflexion views.                           Multiple small-mouthed diverticula were found in                            the sigmoid colon. Complications:            No immediate complications. Estimated Blood Loss:     Estimated blood loss: none. Impression:               - One 2 mm polyp in the rectum, removed with a cold                             biopsy forceps. Resected and retrieved.                           - The examination was otherwise normal on direct                            and retroflexion views.                           - Diverticulosis in the sigmoid colon. Recommendation:           - Patient has a contact number available for                            emergencies. The signs and symptoms of potential                            delayed complications were discussed with the  patient. Return to normal activities tomorrow.                            Written discharge instructions were provided to the                            patient.                           - Resume previous diet.                           - Continue present medications.                           - Await pathology results.                           - Repeat colonoscopy is recommended for                            surveillance. The colonoscopy date will be                            determined after pathology results from today's                            exam become available for review. Treg Diemer L. Loletha Carrow, MD 04/28/2016 8:51:52 AM This report has been signed electronically.

## 2016-04-28 NOTE — Progress Notes (Signed)
Report to PACU, RN, vss, BBS= Clear.  

## 2016-04-28 NOTE — Progress Notes (Signed)
Called to room to assist during endoscopic procedure.  Patient ID and intended procedure confirmed with present staff. Received instructions for my participation in the procedure from the performing physician.  

## 2016-05-01 ENCOUNTER — Telehealth: Payer: Self-pay

## 2016-05-01 NOTE — Telephone Encounter (Signed)
  Follow up Call-  Call back number 04/28/2016  Post procedure Call Back phone  # [phone]  Permission to leave phone message Yes  Some recent data might be hidden     Patient questions:  Do you have a fever, pain , or abdominal swelling? No. Pain Score  0 *  Have you tolerated food without any problems? Yes.    Have you been able to return to your normal activities? Yes.    Do you have any questions about your discharge instructions: Diet   No. Medications  No. Follow up visit  No.  Do you have questions or concerns about your Care? No.  Actions: * If pain score is 4 or above: No action needed, pain <4.

## 2016-05-02 DIAGNOSIS — J301 Allergic rhinitis due to pollen: Secondary | ICD-10-CM | POA: Diagnosis not present

## 2016-05-03 ENCOUNTER — Encounter: Payer: Self-pay | Admitting: Gastroenterology

## 2016-05-09 DIAGNOSIS — J301 Allergic rhinitis due to pollen: Secondary | ICD-10-CM | POA: Diagnosis not present

## 2016-05-16 DIAGNOSIS — J301 Allergic rhinitis due to pollen: Secondary | ICD-10-CM | POA: Diagnosis not present

## 2016-05-23 DIAGNOSIS — J301 Allergic rhinitis due to pollen: Secondary | ICD-10-CM | POA: Diagnosis not present

## 2016-05-25 DIAGNOSIS — Z23 Encounter for immunization: Secondary | ICD-10-CM | POA: Diagnosis not present

## 2016-05-30 DIAGNOSIS — R55 Syncope and collapse: Secondary | ICD-10-CM | POA: Diagnosis not present

## 2016-05-30 DIAGNOSIS — E039 Hypothyroidism, unspecified: Secondary | ICD-10-CM | POA: Diagnosis not present

## 2016-05-30 DIAGNOSIS — R5383 Other fatigue: Secondary | ICD-10-CM | POA: Diagnosis not present

## 2016-05-30 DIAGNOSIS — I951 Orthostatic hypotension: Secondary | ICD-10-CM | POA: Diagnosis not present

## 2016-06-01 DIAGNOSIS — R5383 Other fatigue: Secondary | ICD-10-CM | POA: Diagnosis not present

## 2016-06-01 DIAGNOSIS — R55 Syncope and collapse: Secondary | ICD-10-CM | POA: Diagnosis not present

## 2016-06-01 DIAGNOSIS — I951 Orthostatic hypotension: Secondary | ICD-10-CM | POA: Diagnosis not present

## 2016-06-01 DIAGNOSIS — E039 Hypothyroidism, unspecified: Secondary | ICD-10-CM | POA: Diagnosis not present

## 2016-06-05 DIAGNOSIS — J301 Allergic rhinitis due to pollen: Secondary | ICD-10-CM | POA: Diagnosis not present

## 2016-06-12 DIAGNOSIS — J301 Allergic rhinitis due to pollen: Secondary | ICD-10-CM | POA: Diagnosis not present

## 2016-06-19 DIAGNOSIS — J301 Allergic rhinitis due to pollen: Secondary | ICD-10-CM | POA: Diagnosis not present

## 2016-06-26 DIAGNOSIS — J301 Allergic rhinitis due to pollen: Secondary | ICD-10-CM | POA: Diagnosis not present

## 2016-06-26 DIAGNOSIS — J3089 Other allergic rhinitis: Secondary | ICD-10-CM | POA: Diagnosis not present

## 2016-07-03 DIAGNOSIS — J301 Allergic rhinitis due to pollen: Secondary | ICD-10-CM | POA: Diagnosis not present

## 2016-07-12 DIAGNOSIS — J301 Allergic rhinitis due to pollen: Secondary | ICD-10-CM | POA: Diagnosis not present

## 2016-07-17 DIAGNOSIS — J301 Allergic rhinitis due to pollen: Secondary | ICD-10-CM | POA: Diagnosis not present

## 2016-07-31 DIAGNOSIS — J301 Allergic rhinitis due to pollen: Secondary | ICD-10-CM | POA: Diagnosis not present

## 2016-08-01 DIAGNOSIS — N959 Unspecified menopausal and perimenopausal disorder: Secondary | ICD-10-CM | POA: Diagnosis not present

## 2016-08-01 DIAGNOSIS — R030 Elevated blood-pressure reading, without diagnosis of hypertension: Secondary | ICD-10-CM | POA: Diagnosis not present

## 2016-08-01 DIAGNOSIS — E039 Hypothyroidism, unspecified: Secondary | ICD-10-CM | POA: Diagnosis not present

## 2016-08-03 DIAGNOSIS — M5430 Sciatica, unspecified side: Secondary | ICD-10-CM | POA: Diagnosis not present

## 2016-08-03 DIAGNOSIS — J069 Acute upper respiratory infection, unspecified: Secondary | ICD-10-CM | POA: Diagnosis not present

## 2016-08-03 DIAGNOSIS — N951 Menopausal and female climacteric states: Secondary | ICD-10-CM | POA: Diagnosis not present

## 2016-08-03 DIAGNOSIS — E039 Hypothyroidism, unspecified: Secondary | ICD-10-CM | POA: Diagnosis not present

## 2016-08-07 DIAGNOSIS — J301 Allergic rhinitis due to pollen: Secondary | ICD-10-CM | POA: Diagnosis not present

## 2016-08-15 DIAGNOSIS — J301 Allergic rhinitis due to pollen: Secondary | ICD-10-CM | POA: Diagnosis not present

## 2016-08-22 DIAGNOSIS — J301 Allergic rhinitis due to pollen: Secondary | ICD-10-CM | POA: Diagnosis not present

## 2016-08-24 DIAGNOSIS — Z23 Encounter for immunization: Secondary | ICD-10-CM | POA: Diagnosis not present

## 2016-08-25 DIAGNOSIS — J301 Allergic rhinitis due to pollen: Secondary | ICD-10-CM | POA: Diagnosis not present

## 2016-08-31 DIAGNOSIS — J301 Allergic rhinitis due to pollen: Secondary | ICD-10-CM | POA: Diagnosis not present

## 2016-09-06 DIAGNOSIS — J301 Allergic rhinitis due to pollen: Secondary | ICD-10-CM | POA: Diagnosis not present

## 2016-09-11 DIAGNOSIS — J301 Allergic rhinitis due to pollen: Secondary | ICD-10-CM | POA: Diagnosis not present

## 2016-09-12 DIAGNOSIS — J301 Allergic rhinitis due to pollen: Secondary | ICD-10-CM | POA: Diagnosis not present

## 2016-09-15 DIAGNOSIS — J301 Allergic rhinitis due to pollen: Secondary | ICD-10-CM | POA: Diagnosis not present

## 2016-09-19 DIAGNOSIS — J301 Allergic rhinitis due to pollen: Secondary | ICD-10-CM | POA: Diagnosis not present

## 2016-09-25 DIAGNOSIS — J301 Allergic rhinitis due to pollen: Secondary | ICD-10-CM | POA: Diagnosis not present

## 2016-09-27 DIAGNOSIS — J301 Allergic rhinitis due to pollen: Secondary | ICD-10-CM | POA: Diagnosis not present

## 2016-10-02 DIAGNOSIS — J301 Allergic rhinitis due to pollen: Secondary | ICD-10-CM | POA: Diagnosis not present

## 2016-10-10 DIAGNOSIS — J301 Allergic rhinitis due to pollen: Secondary | ICD-10-CM | POA: Diagnosis not present

## 2016-10-17 DIAGNOSIS — J301 Allergic rhinitis due to pollen: Secondary | ICD-10-CM | POA: Diagnosis not present

## 2016-10-23 DIAGNOSIS — J301 Allergic rhinitis due to pollen: Secondary | ICD-10-CM | POA: Diagnosis not present

## 2016-10-31 DIAGNOSIS — J301 Allergic rhinitis due to pollen: Secondary | ICD-10-CM | POA: Diagnosis not present

## 2016-10-31 DIAGNOSIS — E039 Hypothyroidism, unspecified: Secondary | ICD-10-CM | POA: Diagnosis not present

## 2016-11-02 DIAGNOSIS — R5383 Other fatigue: Secondary | ICD-10-CM | POA: Diagnosis not present

## 2016-11-02 DIAGNOSIS — Z6829 Body mass index (BMI) 29.0-29.9, adult: Secondary | ICD-10-CM | POA: Diagnosis not present

## 2016-11-02 DIAGNOSIS — R635 Abnormal weight gain: Secondary | ICD-10-CM | POA: Diagnosis not present

## 2016-11-02 DIAGNOSIS — E039 Hypothyroidism, unspecified: Secondary | ICD-10-CM | POA: Diagnosis not present

## 2016-11-07 DIAGNOSIS — J301 Allergic rhinitis due to pollen: Secondary | ICD-10-CM | POA: Diagnosis not present

## 2016-11-15 DIAGNOSIS — J301 Allergic rhinitis due to pollen: Secondary | ICD-10-CM | POA: Diagnosis not present

## 2016-11-20 DIAGNOSIS — J301 Allergic rhinitis due to pollen: Secondary | ICD-10-CM | POA: Diagnosis not present

## 2016-11-20 DIAGNOSIS — R0602 Shortness of breath: Secondary | ICD-10-CM | POA: Diagnosis not present

## 2016-11-20 DIAGNOSIS — H1045 Other chronic allergic conjunctivitis: Secondary | ICD-10-CM | POA: Diagnosis not present

## 2016-11-28 DIAGNOSIS — J301 Allergic rhinitis due to pollen: Secondary | ICD-10-CM | POA: Diagnosis not present

## 2016-12-04 DIAGNOSIS — J301 Allergic rhinitis due to pollen: Secondary | ICD-10-CM | POA: Diagnosis not present

## 2016-12-11 DIAGNOSIS — J301 Allergic rhinitis due to pollen: Secondary | ICD-10-CM | POA: Diagnosis not present

## 2016-12-14 DIAGNOSIS — E039 Hypothyroidism, unspecified: Secondary | ICD-10-CM | POA: Diagnosis not present

## 2016-12-18 DIAGNOSIS — L719 Rosacea, unspecified: Secondary | ICD-10-CM | POA: Diagnosis not present

## 2016-12-18 DIAGNOSIS — E039 Hypothyroidism, unspecified: Secondary | ICD-10-CM | POA: Diagnosis not present

## 2016-12-18 DIAGNOSIS — J309 Allergic rhinitis, unspecified: Secondary | ICD-10-CM | POA: Diagnosis not present

## 2016-12-18 DIAGNOSIS — J301 Allergic rhinitis due to pollen: Secondary | ICD-10-CM | POA: Diagnosis not present

## 2016-12-18 DIAGNOSIS — N8110 Cystocele, unspecified: Secondary | ICD-10-CM | POA: Diagnosis not present

## 2016-12-27 DIAGNOSIS — J301 Allergic rhinitis due to pollen: Secondary | ICD-10-CM | POA: Diagnosis not present

## 2016-12-28 DIAGNOSIS — Z1151 Encounter for screening for human papillomavirus (HPV): Secondary | ICD-10-CM | POA: Diagnosis not present

## 2016-12-28 DIAGNOSIS — Z124 Encounter for screening for malignant neoplasm of cervix: Secondary | ICD-10-CM | POA: Diagnosis not present

## 2016-12-28 DIAGNOSIS — N8189 Other female genital prolapse: Secondary | ICD-10-CM | POA: Diagnosis not present

## 2016-12-28 DIAGNOSIS — N393 Stress incontinence (female) (male): Secondary | ICD-10-CM | POA: Diagnosis not present

## 2016-12-29 DIAGNOSIS — J301 Allergic rhinitis due to pollen: Secondary | ICD-10-CM | POA: Diagnosis not present

## 2017-01-03 DIAGNOSIS — J301 Allergic rhinitis due to pollen: Secondary | ICD-10-CM | POA: Diagnosis not present

## 2017-01-10 DIAGNOSIS — J301 Allergic rhinitis due to pollen: Secondary | ICD-10-CM | POA: Diagnosis not present

## 2017-01-17 DIAGNOSIS — J301 Allergic rhinitis due to pollen: Secondary | ICD-10-CM | POA: Diagnosis not present

## 2017-01-22 DIAGNOSIS — J301 Allergic rhinitis due to pollen: Secondary | ICD-10-CM | POA: Diagnosis not present

## 2017-01-31 DIAGNOSIS — J301 Allergic rhinitis due to pollen: Secondary | ICD-10-CM | POA: Diagnosis not present

## 2017-02-02 DIAGNOSIS — J301 Allergic rhinitis due to pollen: Secondary | ICD-10-CM | POA: Diagnosis not present

## 2017-02-05 DIAGNOSIS — J301 Allergic rhinitis due to pollen: Secondary | ICD-10-CM | POA: Diagnosis not present

## 2017-02-05 DIAGNOSIS — E039 Hypothyroidism, unspecified: Secondary | ICD-10-CM | POA: Diagnosis not present

## 2017-02-08 DIAGNOSIS — N8110 Cystocele, unspecified: Secondary | ICD-10-CM | POA: Diagnosis not present

## 2017-02-08 DIAGNOSIS — J301 Allergic rhinitis due to pollen: Secondary | ICD-10-CM | POA: Diagnosis not present

## 2017-02-08 DIAGNOSIS — Z6830 Body mass index (BMI) 30.0-30.9, adult: Secondary | ICD-10-CM | POA: Diagnosis not present

## 2017-02-08 DIAGNOSIS — N952 Postmenopausal atrophic vaginitis: Secondary | ICD-10-CM | POA: Diagnosis not present

## 2017-02-08 DIAGNOSIS — E039 Hypothyroidism, unspecified: Secondary | ICD-10-CM | POA: Diagnosis not present

## 2017-02-12 DIAGNOSIS — J301 Allergic rhinitis due to pollen: Secondary | ICD-10-CM | POA: Diagnosis not present

## 2017-02-12 NOTE — Patient Instructions (Addendum)
Your procedure is scheduled on:  Wednesday, February 21, 2017  Enter through the [employer] [facility] at:  7:00 AM  Pick up the phone at the desk and dial [phone].  Call this number if you have problems the morning of surgery: [phone].  Remember: Do NOT eat food or drink after:  Midnight Tuesday  Take these medicines the morning of surgery with a SIP OF WATER:  Buspirone, Levoxyl  Bring Albuterol Inhaler day of surgery   Do NOT wear jewelry (body piercing), metal hair clips/bobby pins, make-up, artifical eyelashes or nail polish. Do NOT wear lotions, powders, or perfumes.  You may wear deodorant. Do NOT shave for 48 hours prior to surgery. Do NOT bring valuables to the hospital.  Leave suitcase in car.  After surgery it may be brought to your room.  For patients admitted to the hospital, checkout time is 11:00 AM the day of discharge. Home with husband Shanon Brow cell [phone].

## 2017-02-13 ENCOUNTER — Encounter (HOSPITAL_COMMUNITY)
Admission: RE | Admit: 2017-02-13 | Discharge: 2017-02-13 | Disposition: A | Discharge status: Home / Self Care | Payer: PPO | Source: Ambulatory Visit | Attending: Obstetrics and Gynecology | Admitting: Obstetrics and Gynecology

## 2017-02-13 ENCOUNTER — Encounter (HOSPITAL_COMMUNITY): Payer: Self-pay | Admitting: *Deleted

## 2017-02-13 DIAGNOSIS — Z01818 Encounter for other preprocedural examination: Secondary | ICD-10-CM | POA: Insufficient documentation

## 2017-02-13 DIAGNOSIS — N8189 Other female genital prolapse: Secondary | ICD-10-CM | POA: Insufficient documentation

## 2017-02-13 DIAGNOSIS — Z0189 Encounter for other specified special examinations: Secondary | ICD-10-CM | POA: Diagnosis not present

## 2017-02-13 HISTORY — DX: Hypothyroidism, unspecified: E03.9

## 2017-02-13 HISTORY — DX: Gastro-esophageal reflux disease without esophagitis: K21.9

## 2017-02-13 HISTORY — DX: Anxiety disorder, unspecified: F41.9

## 2017-02-13 HISTORY — DX: Other seasonal allergic rhinitis: J30.2

## 2017-02-13 LAB — CBC WITH DIFFERENTIAL/PLATELET
Basophils Absolute: 0 10*3/uL (ref 0.0–0.1)
Basophils Relative: 0 %
EOS PCT: 3 %
Eosinophils Absolute: 0.2 10*3/uL (ref 0.0–0.7)
HCT: 37.2 % (ref 36.0–46.0)
Hemoglobin: 12.4 g/dL (ref 12.0–15.0)
LYMPHS ABS: 2.6 10*3/uL (ref 0.7–4.0)
LYMPHS PCT: 33 %
MCH: 27.9 pg (ref 26.0–34.0)
MCHC: 33.3 g/dL (ref 30.0–36.0)
MCV: 83.6 fL (ref 78.0–100.0)
MONO ABS: 0.2 10*3/uL (ref 0.1–1.0)
Monocytes Relative: 3 %
NEUTROS ABS: 4.8 10*3/uL (ref 1.7–7.7)
Neutrophils Relative %: 61 %
PLATELETS: 245 10*3/uL (ref 150–400)
RBC: 4.45 MIL/uL (ref 3.87–5.11)
RDW: 13.4 % (ref 11.5–15.5)
WBC: 7.9 10*3/uL (ref 4.0–10.5)

## 2017-02-19 DIAGNOSIS — J301 Allergic rhinitis due to pollen: Secondary | ICD-10-CM | POA: Diagnosis not present

## 2017-02-20 NOTE — H&P (Addendum)
Alyssa Fisher is an 51 y.o. female.  She was referred in April  for possible prolapse. Over the past month she has had increasing pelvic pressure and feels a bulge and has irritation. Very uncomfortable when on her feet for long periods, can't have coitus due to discomfort. Menopausal, LMP about 2 years ago, has had some recent spotting.  She also s/o urine leak with strain.  On exam she had a significant cystocele and small rectocele, and after discussing options she is admitted for surgical treatment.  Pertinent Gynecological History:  Last pap: normal Date: 2018 OB History: G6, P3215 SVD x 5   Menstrual History: Patient's last menstrual period was 10/12/2012.    Past Medical History:  Diagnosis Date  . Anxiety   . Broken ankle   . Dysphagia 10-23-12   due to thyroid issues  . GERD (gastroesophageal reflux disease)   . History of tonsillectomy   . Hypothyroidism   . Miscarriage    One - date not on medical form.  . Mitral valve prolapse 10/23/2012  . 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 Rosacea   . Seasonal allergies    uses albuterol inhaler as needed for allergies  . SVD (spontaneous vaginal delivery)    x 5  . Thyroid goiter 10-23-12   multinodular- has been followed x 22yrs    Past Surgical History:  Procedure Laterality Date  . broken ankle Right    Pins/rod inserted-retained  . COLONOSCOPY    . DILATION AND CURETTAGE OF UTERUS     MAB  . THYROIDECTOMY N/A 10/29/2012   Procedure: THYROIDECTOMY;  Surgeon: Earnstine Regal, MD;  Location: [facility];  Service: General;  Laterality: N/A;  Total Thyroidectomy  . TONSILLECTOMY    . WISDOM TOOTH EXTRACTION      Family History  Problem Relation Age of Onset  . Heart disease Father   . Colon polyps Maternal Grandmother     Social History:  reports that she has never smoked. She has never used smokeless tobacco. She reports that she drinks alcohol. She reports that she does not use drugs.  Allergies:   Allergies  Allergen Reactions  . Bupropion Other (See Comments)    Speech sx- stuttering  . Codeine     Patient stated "possible" due to mother and grandparents being allergic as well. Per medical history form dated 01/18/11. Prefers not to have.    No prescriptions prior to admission.    Review of Systems  Respiratory: Negative.   Cardiovascular: Negative.     Last menstrual period 10/12/2012. Physical Exam  Constitutional: She appears well-developed and well-nourished.  Neck: Neck supple. No thyromegaly present.  Cardiovascular: Normal rate, regular rhythm and normal heart sounds.   No murmur heard. Respiratory: Effort normal and breath sounds normal. No respiratory distress. She has no wheezes.  GI: Soft. She exhibits no distension and no mass. There is no tenderness.  Genitourinary: Uterus normal.  Genitourinary Comments: Gr II-II cystocele, Gr I rectocele No adnexal mass    No results found for this or any previous visit (from the past 24 hour(s)).  No results found.  Assessment/Plan: Symptomatic pelvic relaxation and stress incontinence.  Medical and surgical options have been discussed, she wants to proceed with surgical treatment.  Surgical procedure, risks, chances of relieving treatment have all been discussed.  Specific risks associated with mesh used for the sling have been discussed.  Will admit for [facility], possible bilateral salpingectomy, A&P repair,  Solyx sling.    Alyssa Fisher 02/20/2017, 8:18 PM

## 2017-02-20 NOTE — Anesthesia Preprocedure Evaluation (Addendum)
Anesthesia Evaluation  Patient identified by MRN, date of birth, ID band Patient awake    Reviewed: Allergy & Precautions, H&P , NPO status , Patient's Chart, lab work & pertinent test results  Airway Mallampati: III  TM Distance: >3 FB Neck ROM: Full    Dental no notable dental hx.    Pulmonary neg pulmonary ROS,    Pulmonary exam normal breath sounds clear to auscultation       Cardiovascular Normal cardiovascular exam Rhythm:Regular Rate:Normal  MVP   Neuro/Psych negative neurological ROS  negative psych ROS   GI/Hepatic Neg liver ROS, GERD  Medicated and Controlled,  Endo/Other  Hypothyroidism   Renal/GU negative Renal ROS  negative genitourinary   Musculoskeletal negative musculoskeletal ROS (+)   Abdominal (+) + obese,   Peds negative pediatric ROS (+)  Hematology negative hematology ROS (+)   Anesthesia Other Findings obese  Reproductive/Obstetrics negative OB ROS                            Anesthesia Physical  Anesthesia Plan  ASA: II  Anesthesia Plan: General   Post-op Pain Management:    Induction: Intravenous  PONV Risk Score and Plan: 4 or greater and Ondansetron, Dexamethasone, Propofol and Midazolam  Airway Management Planned: Oral ETT  Additional Equipment:   Intra-op Plan:   Post-operative Plan: Extubation in OR  Informed Consent: I have reviewed the patients History and Physical, chart, labs and discussed the procedure including the risks, benefits and alternatives for the proposed anesthesia with the patient or authorized representative who has indicated his/her understanding and acceptance.   Dental advisory given  Plan Discussed with: CRNA and Surgeon  Anesthesia Plan Comments:        Anesthesia Quick Evaluation

## 2017-02-21 ENCOUNTER — Encounter (HOSPITAL_COMMUNITY): Payer: Self-pay | Admitting: Anesthesiology

## 2017-02-21 ENCOUNTER — Ambulatory Visit (HOSPITAL_COMMUNITY): Payer: PPO | Admitting: Anesthesiology

## 2017-02-21 ENCOUNTER — Encounter (HOSPITAL_COMMUNITY)
Admission: RE | Disposition: A | Discharge status: Home / Self Care | Payer: Self-pay | Source: Ambulatory Visit | Attending: Obstetrics and Gynecology

## 2017-02-21 ENCOUNTER — Ambulatory Visit (HOSPITAL_COMMUNITY)
Admission: RE | Admit: 2017-02-21 | Discharge: 2017-02-22 | Disposition: A | Discharge status: Home / Self Care | Payer: PPO | Source: Ambulatory Visit | Attending: Obstetrics and Gynecology | Admitting: Obstetrics and Gynecology

## 2017-02-21 DIAGNOSIS — Z7951 Long term (current) use of inhaled steroids: Secondary | ICD-10-CM | POA: Diagnosis not present

## 2017-02-21 DIAGNOSIS — Z780 Asymptomatic menopausal state: Secondary | ICD-10-CM | POA: Diagnosis not present

## 2017-02-21 DIAGNOSIS — F419 Anxiety disorder, unspecified: Secondary | ICD-10-CM | POA: Diagnosis not present

## 2017-02-21 DIAGNOSIS — Z8371 Family history of colonic polyps: Secondary | ICD-10-CM | POA: Insufficient documentation

## 2017-02-21 DIAGNOSIS — N950 Postmenopausal bleeding: Secondary | ICD-10-CM | POA: Diagnosis not present

## 2017-02-21 DIAGNOSIS — K219 Gastro-esophageal reflux disease without esophagitis: Secondary | ICD-10-CM | POA: Diagnosis not present

## 2017-02-21 DIAGNOSIS — N819 Female genital prolapse, unspecified: Secondary | ICD-10-CM | POA: Diagnosis not present

## 2017-02-21 DIAGNOSIS — M1990 Unspecified osteoarthritis, unspecified site: Secondary | ICD-10-CM | POA: Diagnosis not present

## 2017-02-21 DIAGNOSIS — N8189 Other female genital prolapse: Secondary | ICD-10-CM | POA: Diagnosis present

## 2017-02-21 DIAGNOSIS — E890 Postprocedural hypothyroidism: Secondary | ICD-10-CM | POA: Insufficient documentation

## 2017-02-21 DIAGNOSIS — Z79899 Other long term (current) drug therapy: Secondary | ICD-10-CM | POA: Insufficient documentation

## 2017-02-21 DIAGNOSIS — I341 Nonrheumatic mitral (valve) prolapse: Secondary | ICD-10-CM | POA: Insufficient documentation

## 2017-02-21 DIAGNOSIS — N393 Stress incontinence (female) (male): Secondary | ICD-10-CM | POA: Diagnosis not present

## 2017-02-21 DIAGNOSIS — Z79890 Hormone replacement therapy (postmenopausal): Secondary | ICD-10-CM | POA: Insufficient documentation

## 2017-02-21 DIAGNOSIS — N800 Endometriosis of uterus: Secondary | ICD-10-CM | POA: Diagnosis not present

## 2017-02-21 DIAGNOSIS — E039 Hypothyroidism, unspecified: Secondary | ICD-10-CM | POA: Diagnosis not present

## 2017-02-21 HISTORY — PX: BLADDER SUSPENSION: SHX72

## 2017-02-21 HISTORY — PX: VAGINAL HYSTERECTOMY: SHX2639

## 2017-02-21 HISTORY — PX: ANTERIOR AND POSTERIOR REPAIR: SHX5121

## 2017-02-21 HISTORY — PX: BILATERAL SALPINGECTOMY: SHX5743

## 2017-02-21 SURGERY — HYSTERECTOMY, VAGINAL
Anesthesia: General | Site: Vagina

## 2017-02-21 MED ORDER — ONDANSETRON HCL 4 MG/2ML IJ SOLN
INTRAMUSCULAR | Status: DC | PRN
  Administered 2017-02-21: 4 mg via INTRAVENOUS

## 2017-02-21 MED ORDER — HYDROMORPHONE HCL 1 MG/ML IJ SOLN
INTRAMUSCULAR | Status: AC
  Administered 2017-02-21: 0.25 mg via INTRAVENOUS
  Filled 2017-02-21: qty 0.5

## 2017-02-21 MED ORDER — KETOROLAC TROMETHAMINE 30 MG/ML IJ SOLN
30.0000 mg | Freq: Four times a day (QID) | INTRAMUSCULAR | Status: DC
  Administered 2017-02-21 – 2017-02-22 (×3): 30 mg via INTRAVENOUS
  Filled 2017-02-21 (×2): qty 1

## 2017-02-21 MED ORDER — BUPIVACAINE HCL (PF) 0.5 % IJ SOLN
INTRAMUSCULAR | Status: AC
  Filled 2017-02-21: qty 30

## 2017-02-21 MED ORDER — BUSPIRONE HCL 5 MG PO TABS
5.0000 mg | ORAL_TABLET | Freq: Two times a day (BID) | ORAL | Status: DC | PRN
  Filled 2017-02-21: qty 1

## 2017-02-21 MED ORDER — ACETAMINOPHEN 160 MG/5ML PO SOLN
ORAL | Status: AC
  Filled 2017-02-21: qty 40.6

## 2017-02-21 MED ORDER — PROPOFOL 10 MG/ML IV BOLUS
INTRAVENOUS | Status: DC | PRN
  Administered 2017-02-21: 180 mg via INTRAVENOUS

## 2017-02-21 MED ORDER — EPHEDRINE SULFATE 50 MG/ML IJ SOLN
INTRAMUSCULAR | Status: DC | PRN
  Administered 2017-02-21: 10 mg via INTRAVENOUS

## 2017-02-21 MED ORDER — VASOPRESSIN 20 UNIT/ML IV SOLN
INTRAVENOUS | Status: DC | PRN
  Administered 2017-02-21: 10 mL via INTRAMUSCULAR
  Administered 2017-02-21: 30 mL via INTRAMUSCULAR

## 2017-02-21 MED ORDER — ONDANSETRON HCL 4 MG PO TABS: 4.0000 mg | ORAL_TABLET | Freq: Four times a day (QID) | ORAL | Status: DC | PRN

## 2017-02-21 MED ORDER — KETOROLAC TROMETHAMINE 30 MG/ML IJ SOLN
INTRAMUSCULAR | Status: DC | PRN
  Administered 2017-02-21: 30 mg via INTRAVENOUS

## 2017-02-21 MED ORDER — BUPIVACAINE-EPINEPHRINE (PF) 0.5% -1:200000 IJ SOLN
INTRAMUSCULAR | Status: AC
  Filled 2017-02-21: qty 30

## 2017-02-21 MED ORDER — FENTANYL CITRATE (PF) 250 MCG/5ML IJ SOLN
INTRAMUSCULAR | Status: AC
  Filled 2017-02-21: qty 5

## 2017-02-21 MED ORDER — VASOPRESSIN 20 UNIT/ML IV SOLN
INTRAVENOUS | Status: AC
  Filled 2017-02-21: qty 1

## 2017-02-21 MED ORDER — DEXAMETHASONE SODIUM PHOSPHATE 4 MG/ML IJ SOLN
INTRAMUSCULAR | Status: AC
  Filled 2017-02-21: qty 1

## 2017-02-21 MED ORDER — HYDROMORPHONE HCL 1 MG/ML IJ SOLN
0.2500 mg | INTRAMUSCULAR | Status: DC | PRN
  Administered 2017-02-21 (×4): 0.25 mg via INTRAVENOUS
  Administered 2017-02-21: 0.5 mg via INTRAVENOUS

## 2017-02-21 MED ORDER — ESTRADIOL 0.1 MG/GM VA CREA
TOPICAL_CREAM | VAGINAL | Status: DC | PRN
  Administered 2017-02-21: 1 via VAGINAL

## 2017-02-21 MED ORDER — SCOPOLAMINE 1 MG/3DAYS TD PT72
MEDICATED_PATCH | TRANSDERMAL | Status: AC
  Filled 2017-02-21: qty 1

## 2017-02-21 MED ORDER — CEFAZOLIN SODIUM-DEXTROSE 2-4 GM/100ML-% IV SOLN
2.0000 g | INTRAVENOUS | Status: AC
Start: 2017-02-21 — End: 2017-02-21
  Administered 2017-02-21: 2 g via INTRAVENOUS

## 2017-02-21 MED ORDER — KETOROLAC TROMETHAMINE 30 MG/ML IJ SOLN
INTRAMUSCULAR | Status: AC
  Filled 2017-02-21: qty 1

## 2017-02-21 MED ORDER — MIDAZOLAM HCL 2 MG/2ML IJ SOLN
INTRAMUSCULAR | Status: DC | PRN
  Administered 2017-02-21: 1 mg via INTRAVENOUS

## 2017-02-21 MED ORDER — GABAPENTIN 300 MG PO CAPS
300.0000 mg | ORAL_CAPSULE | Freq: Three times a day (TID) | ORAL | Status: DC
  Administered 2017-02-21 – 2017-02-22 (×3): 300 mg via ORAL
  Filled 2017-02-21 (×6): qty 1

## 2017-02-21 MED ORDER — SUGAMMADEX SODIUM 200 MG/2ML IV SOLN
INTRAVENOUS | Status: DC | PRN
  Administered 2017-02-21: 167.4 mg via INTRAVENOUS

## 2017-02-21 MED ORDER — LEVOCETIRIZINE DIHYDROCHLORIDE 5 MG PO TABS: 5.0000 mg | ORAL_TABLET | Freq: Every evening | ORAL | Status: DC

## 2017-02-21 MED ORDER — SIMETHICONE 80 MG PO CHEW: 80.0000 mg | CHEWABLE_TABLET | Freq: Four times a day (QID) | ORAL | Status: DC | PRN

## 2017-02-21 MED ORDER — FAMOTIDINE 20 MG PO TABS
20.0000 mg | ORAL_TABLET | Freq: Once | ORAL | Status: AC
  Administered 2017-02-21: 20 mg via ORAL

## 2017-02-21 MED ORDER — HYDROMORPHONE HCL 2 MG PO TABS
2.0000 mg | ORAL_TABLET | ORAL | Status: DC | PRN
  Administered 2017-02-22 (×3): 2 mg via ORAL
  Filled 2017-02-21 (×3): qty 1

## 2017-02-21 MED ORDER — HYDROMORPHONE HCL 1 MG/ML IJ SOLN
INTRAMUSCULAR | Status: AC
  Filled 2017-02-21: qty 0.5

## 2017-02-21 MED ORDER — SODIUM CHLORIDE 0.9 % IJ SOLN
INTRAMUSCULAR | Status: AC
  Filled 2017-02-21: qty 50

## 2017-02-21 MED ORDER — AZELASTINE HCL 0.1 % NA SOLN: 1.0000 | Freq: Two times a day (BID) | NASAL | Status: DC

## 2017-02-21 MED ORDER — OXYCODONE HCL 5 MG PO TABS: 5.0000 mg | ORAL_TABLET | Freq: Once | ORAL | Status: DC | PRN

## 2017-02-21 MED ORDER — FAMOTIDINE 20 MG PO TABS
ORAL_TABLET | ORAL | Status: AC
  Administered 2017-02-21: 20 mg via ORAL
  Filled 2017-02-21: qty 1

## 2017-02-21 MED ORDER — ESTRADIOL 0.1 MG/GM VA CREA
TOPICAL_CREAM | VAGINAL | Status: AC
Start: 2017-02-21 — End: 2017-02-21
  Filled 2017-02-21: qty 42.5

## 2017-02-21 MED ORDER — LORATADINE 10 MG PO TABS
10.0000 mg | ORAL_TABLET | Freq: Every day | ORAL | Status: DC
  Administered 2017-02-22: 10 mg via ORAL
  Filled 2017-02-21 (×3): qty 1

## 2017-02-21 MED ORDER — ACETAMINOPHEN 160 MG/5ML PO SOLN
975.0000 mg | Freq: Once | ORAL | Status: AC
  Administered 2017-02-21: 975 mg via ORAL

## 2017-02-21 MED ORDER — ONDANSETRON HCL 4 MG/2ML IJ SOLN: 4.0000 mg | Freq: Four times a day (QID) | INTRAMUSCULAR | Status: DC | PRN

## 2017-02-21 MED ORDER — MONTELUKAST SODIUM 10 MG PO TABS
10.0000 mg | ORAL_TABLET | Freq: Every day | ORAL | Status: DC
  Administered 2017-02-21: 10 mg via ORAL
  Filled 2017-02-21 (×2): qty 1

## 2017-02-21 MED ORDER — ROCURONIUM BROMIDE 100 MG/10ML IV SOLN
INTRAVENOUS | Status: DC | PRN
  Administered 2017-02-21: 50 mg via INTRAVENOUS

## 2017-02-21 MED ORDER — ROCURONIUM BROMIDE 100 MG/10ML IV SOLN
INTRAVENOUS | Status: AC
  Filled 2017-02-21: qty 1

## 2017-02-21 MED ORDER — MIDAZOLAM HCL 2 MG/2ML IJ SOLN
INTRAMUSCULAR | Status: AC
  Filled 2017-02-21: qty 2

## 2017-02-21 MED ORDER — STERILE WATER FOR IRRIGATION IR SOLN
Status: DC | PRN
  Administered 2017-02-21: 1000 mL

## 2017-02-21 MED ORDER — HYDROMORPHONE HCL 1 MG/ML IJ SOLN
1.0000 mg | INTRAMUSCULAR | Status: DC | PRN
  Administered 2017-02-21: 1 mg via INTRAVENOUS
  Filled 2017-02-21: qty 1

## 2017-02-21 MED ORDER — LACTATED RINGERS IV SOLN
INTRAVENOUS | Status: DC
  Administered 2017-02-21 (×2): via INTRAVENOUS

## 2017-02-21 MED ORDER — PROPOFOL 10 MG/ML IV BOLUS
INTRAVENOUS | Status: AC
  Filled 2017-02-21: qty 20

## 2017-02-21 MED ORDER — LIDOCAINE HCL (CARDIAC) 20 MG/ML IV SOLN
INTRAVENOUS | Status: DC | PRN
  Administered 2017-02-21: 30 mg via INTRAVENOUS
  Administered 2017-02-21: 70 mg via INTRAVENOUS

## 2017-02-21 MED ORDER — ALUM & MAG HYDROXIDE-SIMETH 200-200-20 MG/5ML PO SUSP: 30.0000 mL | ORAL | Status: DC | PRN

## 2017-02-21 MED ORDER — SCOPOLAMINE 1 MG/3DAYS TD PT72: 1.0000 | MEDICATED_PATCH | Freq: Once | TRANSDERMAL | Status: DC

## 2017-02-21 MED ORDER — PROMETHAZINE HCL 25 MG/ML IJ SOLN: 6.2500 mg | INTRAMUSCULAR | Status: DC | PRN

## 2017-02-21 MED ORDER — DEXTROSE-NACL 5-0.45 % IV SOLN
INTRAVENOUS | Status: DC
  Administered 2017-02-21: 16:00:00 via INTRAVENOUS

## 2017-02-21 MED ORDER — IBUPROFEN 600 MG PO TABS: 600.0000 mg | ORAL_TABLET | Freq: Four times a day (QID) | ORAL | Status: DC | PRN

## 2017-02-21 MED ORDER — ALBUTEROL SULFATE HFA 108 (90 BASE) MCG/ACT IN AERS: 2.0000 | INHALATION_SPRAY | Freq: Four times a day (QID) | RESPIRATORY_TRACT | Status: DC | PRN

## 2017-02-21 MED ORDER — LIDOCAINE HCL (CARDIAC) 20 MG/ML IV SOLN
INTRAVENOUS | Status: AC
  Filled 2017-02-21: qty 5

## 2017-02-21 MED ORDER — EPHEDRINE 5 MG/ML INJ
INTRAVENOUS | Status: AC
  Filled 2017-02-21: qty 10

## 2017-02-21 MED ORDER — ALBUTEROL SULFATE (2.5 MG/3ML) 0.083% IN NEBU: 2.5000 mg | INHALATION_SOLUTION | Freq: Four times a day (QID) | RESPIRATORY_TRACT | Status: DC | PRN

## 2017-02-21 MED ORDER — KETOROLAC TROMETHAMINE 30 MG/ML IJ SOLN
30.0000 mg | Freq: Four times a day (QID) | INTRAMUSCULAR | Status: DC
  Filled 2017-02-21: qty 1

## 2017-02-21 MED ORDER — BUPIVACAINE HCL (PF) 0.5 % IJ SOLN
INTRAMUSCULAR | Status: DC | PRN
  Administered 2017-02-21: 30 mL

## 2017-02-21 MED ORDER — PANTOPRAZOLE SODIUM 40 MG PO TBEC
40.0000 mg | DELAYED_RELEASE_TABLET | Freq: Every day | ORAL | Status: DC
  Administered 2017-02-22: 40 mg via ORAL
  Filled 2017-02-21: qty 1

## 2017-02-21 MED ORDER — KETOROLAC TROMETHAMINE 30 MG/ML IJ SOLN: 30.0000 mg | Freq: Once | INTRAMUSCULAR | Status: DC

## 2017-02-21 MED ORDER — SUGAMMADEX SODIUM 200 MG/2ML IV SOLN
INTRAVENOUS | Status: AC
  Filled 2017-02-21: qty 2

## 2017-02-21 MED ORDER — NEOSTIGMINE METHYLSULFATE 10 MG/10ML IV SOLN
INTRAVENOUS | Status: AC
  Filled 2017-02-21: qty 1

## 2017-02-21 MED ORDER — DEXAMETHASONE SODIUM PHOSPHATE 10 MG/ML IJ SOLN
INTRAMUSCULAR | Status: DC | PRN
  Administered 2017-02-21: 4 mg via INTRAVENOUS

## 2017-02-21 MED ORDER — DOCUSATE SODIUM 100 MG PO CAPS
100.0000 mg | ORAL_CAPSULE | Freq: Every day | ORAL | Status: DC
  Administered 2017-02-21: 100 mg via ORAL
  Filled 2017-02-21: qty 1

## 2017-02-21 MED ORDER — LEVOTHYROXINE SODIUM 137 MCG PO TABS
137.0000 ug | ORAL_TABLET | Freq: Every day | ORAL | Status: DC
  Administered 2017-02-22: 137 ug via ORAL
  Filled 2017-02-21 (×2): qty 1

## 2017-02-21 MED ORDER — OXYCODONE HCL 5 MG/5ML PO SOLN: 5.0000 mg | Freq: Once | ORAL | Status: DC | PRN

## 2017-02-21 MED ORDER — LACTATED RINGERS IV SOLN: INTRAVENOUS | Status: DC

## 2017-02-21 MED ORDER — FENTANYL CITRATE (PF) 100 MCG/2ML IJ SOLN
INTRAMUSCULAR | Status: DC | PRN
  Administered 2017-02-21: 25 ug via INTRAVENOUS
  Administered 2017-02-21 (×4): 50 ug via INTRAVENOUS
  Administered 2017-02-21: 25 ug via INTRAVENOUS

## 2017-02-21 MED ORDER — MENTHOL 3 MG MT LOZG: 1.0000 | LOZENGE | OROMUCOSAL | Status: DC | PRN

## 2017-02-21 SURGICAL SUPPLY — 45 items
BLADE SURG 15 STRL LF C SS BP (BLADE) ×2 IMPLANT
BLADE SURG 15 STRL SS (BLADE) ×2
CANISTER SUCT 3000ML PPV (MISCELLANEOUS) ×4 IMPLANT
CATH ROBINSON RED A/P 16FR (CATHETERS) ×4 IMPLANT
CLOTH BEACON ORANGE TIMEOUT ST (SAFETY) ×4 IMPLANT
CONT PATH 16OZ SNAP LID 3702 (MISCELLANEOUS) IMPLANT
DECANTER SPIKE VIAL GLASS SM (MISCELLANEOUS) ×8 IMPLANT
DERMABOND ADVANCED (GAUZE/BANDAGES/DRESSINGS)
DERMABOND ADVANCED .7 DNX12 (GAUZE/BANDAGES/DRESSINGS) IMPLANT
GAUZE PACKING 2X5 YD STRL (GAUZE/BANDAGES/DRESSINGS) ×4 IMPLANT
GLOVE BIO SURGEON STRL SZ8 (GLOVE) ×8 IMPLANT
GLOVE BIOGEL PI IND STRL 6.5 (GLOVE) ×2 IMPLANT
GLOVE BIOGEL PI IND STRL 7.0 (GLOVE) ×2 IMPLANT
GLOVE BIOGEL PI INDICATOR 6.5 (GLOVE) ×2
GLOVE BIOGEL PI INDICATOR 7.0 (GLOVE) ×2
GLOVE ORTHO TXT STRL SZ7.5 (GLOVE) ×4 IMPLANT
GOWN STRL REUS W/TWL LRG LVL3 (GOWN DISPOSABLE) ×12 IMPLANT
GOWN STRL REUS W/TWL XL LVL3 (GOWN DISPOSABLE) ×4 IMPLANT
NEEDLE HYPO 22GX1.5 SAFETY (NEEDLE) ×4 IMPLANT
NEEDLE MAYO CATGUT SZ4 (NEEDLE) IMPLANT
NS IRRIG 1000ML POUR BTL (IV SOLUTION) ×4 IMPLANT
PACK TRENDGUARD 600 HYBRD PROC (MISCELLANEOUS) IMPLANT
PACK VAGINAL WOMENS (CUSTOM PROCEDURE TRAY) ×4 IMPLANT
PAD OB MATERNITY 4.3X12.25 (PERSONAL CARE ITEMS) ×4 IMPLANT
SET CYSTO W/LG BORE CLAMP LF (SET/KITS/TRAYS/PACK) ×4 IMPLANT
SHEARS FOC LG CVD HARMONIC 17C (MISCELLANEOUS) ×4 IMPLANT
SLING SOLYX SYSTEM SIS EA (Sling) ×4 IMPLANT
SUT CHROMIC 1 TIES 18 (SUTURE) ×4 IMPLANT
SUT CHROMIC 1MO 4 18 CR8 (SUTURE) ×4 IMPLANT
SUT PROLENE 1 CTX 30  8455H (SUTURE)
SUT PROLENE 1 CTX 30 8455H (SUTURE) IMPLANT
SUT SILK 0 SH 30 (SUTURE) ×4 IMPLANT
SUT SILK 2 0 SH (SUTURE) ×4 IMPLANT
SUT VIC AB 2-0 CT1 (SUTURE) ×12 IMPLANT
SUT VIC AB 2-0 CT1 27 (SUTURE) ×6
SUT VIC AB 2-0 CT1 TAPERPNT 27 (SUTURE) ×4 IMPLANT
SUT VIC AB 2-0 CT2 27 (SUTURE) ×20 IMPLANT
SUT VIC AB 3-0 CT1 27 (SUTURE)
SUT VIC AB 3-0 CT1 TAPERPNT 27 (SUTURE) IMPLANT
SUT VIC AB 3-0 SH 27 (SUTURE)
SUT VIC AB 3-0 SH 27X BRD (SUTURE) IMPLANT
SUT VICRYL 0 UR6 27IN ABS (SUTURE) IMPLANT
TOWEL OR 17X24 6PK STRL BLUE (TOWEL DISPOSABLE) ×8 IMPLANT
TRAY FOLEY CATH SILVER 14FR (SET/KITS/TRAYS/PACK) ×4 IMPLANT
TRENDGUARD 600 HYBRID PROC PK (MISCELLANEOUS)

## 2017-02-21 NOTE — Progress Notes (Signed)
Day of Surgery Procedure(s) (LRB): HYSTERECTOMY VAGINAL (Bilateral) ANTERIOR (CYSTOCELE) AND POSTERIOR REPAIR (RECTOCELE) (N/A) Solyx Sling (N/A) BILATERAL SALPINGECTOMY (N/A)  Subjective: Patient reports tolerating PO.  Voiding well per foley, Pain well controlled. Has ambulated. No fever or nausea  Objective: I have reviewed patient's vital signs, intake and output and medications.  General: alert, cooperative and no distress Extremities: no edema, redness or tenderness in the calves or thighs  Assessment: s/p Procedure(s): HYSTERECTOMY VAGINAL (Bilateral) ANTERIOR (CYSTOCELE) AND POSTERIOR REPAIR (RECTOCELE) (N/A) Solyx Sling (N/A) BILATERAL SALPINGECTOMY (N/A): stable  Plan: Routine post op care  LOS: 0 days    Prosper Paff W Ovid Witman 02/21/2017, 7:28 PM

## 2017-02-21 NOTE — Op Note (Signed)
Preoperative diagnosis: Pelvic relaxation, SUI Postop diagnosis: Same Procedure: Total vaginal hysterectomy, bilateral salpingectomy, anterior and posterior repair, Solyx sling Surgeon: Cheri Fowler M.D. Assistant: Paula Compton M.D. Anesthesia: Gen. With an LMA Findings: Patient had a small uterus with normal tubes and ovaries. Via cystoscopy bladder was normal and both ureters were patent Estimated blood loss: 200 cc Specimens: Uterus and tubes for routine pathology Complications: None  Procedure in detail: The patient was taken to the operating room and placed in the dorsosupine position. General anesthesia was induced and she was placed in mobile stirrups. Legs were elevated in the stirrups. Perineum and vagina were then prepped and draped in the usual sterile fashion, bladder drained with red Robinson catheter. A Graves speculum was inserted in the vagina and the cervix was grasped with Ardis Hughs tenaculums. Dilute Pitressin with Marcaine was then instilled at the cervicovaginal junction which was then incised circumferentially with electrocautery. Sharp dissection was then used to further free the vagina from the cervix. Anterior peritoneum was identified and entered sharply. A Deaver retractor was used to retract the bladder anteriorly. Posterior cul-de-sac was identified and entered sharply. A Bonnano speculum was placed into the posterior cul-de-sac. Uterosacral ligaments were clamped transected and ligated with #1 chromic and tagged for later use. Remaining pedicles were taken down easily with Harmonic scalpel and the uterus was removed. Tubes and ovaries were inspected and found to be normal. Both fallopian tubes were able to be grasped and brought into the incision and removed with harmonic scalpel with adequate hemostasis.  .  The vaginal cuff was grasped with 2 Allis clamps.  The cystocele was then mobilized by dissecting anteriorly in the midline from the vaginal cuff to within about 2  cm of the urethral meatus in the midline. Cystocele was mobilized laterally. Cystocele was then reduced with several interrupted sutures of 2-0 Vicryl with adequate reduction. Excess vaginal tissue was removed and the incision was closed to the vaginal cuff with running locking 2-0 Vicryl with adequate closure and adequate hemostasis.  The uterosacral ligaments were then plicated in the midline with 2-0 silk after the Bonnano speculum was removed and a shorter vaginal speculum was placed. A suture of #1 Chromic was also passed from the vagina through the uterosacral ligaments and back out into the vagina to be tied later.  The previously tagged uterosacral pedicles were also tied in the midline. No significant bleeding was identified. The vaginal cuff was then closed in a vertical fashion with running locking 2-0 Vicryl with adequate closure and adequate hemostasis.  The previously placed chromic suture was also tied to help support the vagina.  Attention was now turned to the rectocele repair. The hymenal ring was grasped with 2 Allis clamps at a distance that when brought together would still allow two fingers to  easily pass into the vagina. A horizontal piece of tissue was removed. The posterior vaginal mucosa was then dissected in the midline from the hymenal ring to the vaginal apex. Rectocele was then mobilized bilaterally sharply and bluntly. The rectocele was then reduced with interrupted sutures of 2-0 Vicryl after a rectal exam confirmed this was all rectocele. Excess vaginal mucosa was then removed sharply. Vagina was closed from the vaginal apex to the hymenal ring with running locking 2-0 Vicryl.  This achieved adequate closure and adequate hemostasis.  Attention was now turned to the sling.  The anterior vagina was grasped with Allis clamps proximally 1 1/2 fingerbreadths from the urethral meatus. Local anesthetic with half percent  Marcaine and pitressin was infiltrated in the midline and  bilaterally for hydrodissection. A 1 cm vertical incision was was then made in the vagina between the Allis clamps. The edges of the incision were then grasped with the Allis clamps. Metzenbaum scissors were used to sharply dissected the vaginal mucosa to each pubic ramus. The Solyx sling was then first placed on the patient's right side and anchored behind the pubic bone. A good placement was achieved on the right side. Placement was achieved on the left side in a similar fashion submucosal to just past the pubic ramus. A right angle clamp was able to just barely be passed between the sling and the urethral tissue confirming good tension. Cystoscopy was performed which revealed a normal bladder, patent ureters,  and no evidence of injury to the bladder or the urethra. 200 cc of fluid was used for the cystoscopy. The cystoscope was removed. A Cred maneuver was performed and no leakage was seen. The Solyx sling was released on the left side. The vaginal incision was then closed with running locking 2-0 Vicryl with adequate closure and adequate hemostasis. The vagina was then packed with 2 inch gauze coated with Estrace cream. A Foley catheter was also placed. The patient tolerated the procedure well. She was awakened and taken to the recovery in stable condition. Counts were correct, she had PAS hose on throughout the procedure. She received Ancef 2 g for surgical prophylaxis.

## 2017-02-21 NOTE — Transfer of Care (Signed)
Immediate Anesthesia Transfer of Care Note  Patient: Alyssa Fisher  Procedure(s) Performed: Procedure(s): HYSTERECTOMY VAGINAL (Bilateral) ANTERIOR (CYSTOCELE) AND POSTERIOR REPAIR (RECTOCELE) (N/A) Solyx Sling (N/A) BILATERAL SALPINGECTOMY (N/A)  Patient Location: PACU  Anesthesia Type:General  Level of Consciousness: awake, oriented, sedated and drowsy  Airway & Oxygen Therapy: Patient Spontanous Breathing and Patient connected to nasal cannula oxygen  Post-op Assessment: Report given to RN and Post -op Vital signs reviewed and stable  Post vital signs: Reviewed and stable  Last Vitals:  Vitals:   02/21/17 0711  BP: 129/80  Pulse: 89  Resp: 16  Temp: 36.6 C    Last Pain:  Vitals:   02/21/17 0711  TempSrc: Oral      Patients Stated Pain Goal: 3 (82/06/01 5615)  Complications: No apparent anesthesia complications

## 2017-02-21 NOTE — Interval H&P Note (Signed)
History and Physical Interval Note:  02/21/2017 8:16 AM  Alyssa Fisher  has presented today for surgery, with the diagnosis of Stress incontinence, female genital prolapse  The various methods of treatment have been discussed with the patient and family. After consideration of risks, benefits and other options for treatment, the patient has consented to  Procedure(s) with comments: HYSTERECTOMY VAGINAL (Bilateral) - Possible bilateral salpingectomy  ANTERIOR (CYSTOCELE) AND POSTERIOR REPAIR (RECTOCELE) (N/A) Solyx Sling (N/A) POSSIBLE BILATERAL SALPINGECTOMY (N/A) as a surgical intervention .  The patient's history has been reviewed, patient examined, no change in status, stable for surgery.  I have reviewed the patient's chart and labs.  Questions were answered to the patient's satisfaction.     Blane Ohara [name]

## 2017-02-21 NOTE — Anesthesia Procedure Notes (Signed)
Procedure Name: Intubation Date/Time: 02/21/2017 8:36 AM Performed by: Tobin Chad Pre-anesthesia Checklist: Patient identified, Emergency Drugs available, Suction available and Patient being monitored Patient Re-evaluated:Patient Re-evaluated prior to inductionOxygen Delivery Method: Circle system utilized and Simple face mask Preoxygenation: Pre-oxygenation with 100% oxygen Intubation Type: IV induction Ventilation: Mask ventilation without difficulty Laryngoscope Size: Mac and 3 Grade View: Grade II Tube size: 7.0 mm Number of attempts: 1 Airway Equipment and Method: Stylet Placement Confirmation: ETT inserted through vocal cords under direct vision,  positive ETCO2 and breath sounds checked- equal and bilateral Secured at: 21 (com) cm Tube secured with: Tape Dental Injury: Teeth and Oropharynx as per pre-operative assessment

## 2017-02-21 NOTE — Anesthesia Postprocedure Evaluation (Signed)
Anesthesia Post Note  Patient: Alyssa Fisher  Procedure(s) Performed: Procedure(s) (LRB): HYSTERECTOMY VAGINAL (Bilateral) ANTERIOR (CYSTOCELE) AND POSTERIOR REPAIR (RECTOCELE) (N/A) Solyx Sling (N/A) BILATERAL SALPINGECTOMY (N/A)     Patient location during evaluation: [facility] Anesthesia Type: General Level of consciousness: awake and alert and oriented Pain management: pain level controlled Vital Signs Assessment: post-procedure vital signs reviewed and stable Respiratory status: spontaneous breathing and nonlabored ventilation Cardiovascular status: stable Postop Assessment: no signs of nausea or vomiting and adequate PO intake Anesthetic complications: no    Last Vitals:  Vitals:   02/21/17 1300 02/21/17 1600  BP: 113/66 (!) 113/58  Pulse: 76 86  Resp: 15 16  Temp: 36.5 C 37.2 C    Last Pain:  Vitals:   02/21/17 1616  TempSrc:   PainSc: 5    Pain Goal: Patients Stated Pain Goal: 3 (02/21/17 1200)               [name]

## 2017-02-22 DIAGNOSIS — N8189 Other female genital prolapse: Secondary | ICD-10-CM | POA: Diagnosis not present

## 2017-02-22 LAB — CBC
HEMATOCRIT: 30.6 % — AB (ref 36.0–46.0)
Hemoglobin: 10.2 g/dL — ABNORMAL LOW (ref 12.0–15.0)
MCH: 28.2 pg (ref 26.0–34.0)
MCHC: 33.3 g/dL (ref 30.0–36.0)
MCV: 84.5 fL (ref 78.0–100.0)
Platelets: 214 10*3/uL (ref 150–400)
RBC: 3.62 MIL/uL — ABNORMAL LOW (ref 3.87–5.11)
RDW: 14 % (ref 11.5–15.5)
WBC: 10.4 10*3/uL (ref 4.0–10.5)

## 2017-02-22 MED ORDER — HYDROMORPHONE HCL 2 MG PO TABS: 2.0000 mg | ORAL_TABLET | ORAL | 0 refills | Status: DC | PRN

## 2017-02-22 NOTE — Discharge Instructions (Signed)
Routine instructions for hysterectomy °

## 2017-02-22 NOTE — Progress Notes (Signed)
Discharge information given to patient and her spouse and both verbalize understanding.

## 2017-02-22 NOTE — Discharge Summary (Signed)
Physician Discharge Summary  Patient ID: Alyssa Fisher MRN: [id number] DOB/AGE: [birth year] 52 y.o.  Admit date: 02/21/2017 Discharge date: 02/22/2017  Admission Diagnoses:  Pelvic relaxation, SUI  Discharge Diagnoses: Same Active Problems:   Pelvic relaxation   Discharged Condition: good  Hospital Course: Pt admitted and underwent TVH, bilateral salpingectomy, A&P repair and Solyx sling without complications.  She had some pain post-op relieved with Dilaudid.  Vaginal packing and foley cath removed POD #1, Hgb appropriate.  She was able to ambulate and void and felt stable for discharge.  Discharge Exam: Blood pressure (!) 106/52, pulse 81, temperature 98.4 F (36.9 C), temperature source Oral, resp. rate 18, height 5\' 4"  (1.626 m), weight 83.7 kg (184 lb 8 oz), last menstrual period 10/12/2012, SpO2 95 %. General appearance: alert  Disposition: 01-Home or Self Care  Discharge Instructions    Call MD for:  difficulty breathing, headache or visual disturbances    Complete by:  As directed    Call MD for:  extreme fatigue    Complete by:  As directed    Call MD for:  persistant dizziness or light-headedness    Complete by:  As directed    Call MD for:  persistant nausea and vomiting    Complete by:  As directed    Call MD for:  severe uncontrolled pain    Complete by:  As directed    Call MD for:  temperature >100.4    Complete by:  As directed    Diet - low sodium heart healthy    Complete by:  As directed    Increase activity slowly    Complete by:  As directed    Lifting restrictions    Complete by:  As directed    10 lbs   Sexual Activity Restrictions    Complete by:  As directed    Pelvic rest     Allergies as of 02/22/2017      Reactions   Bupropion Other (See Comments)   Speech sx- stuttering   Codeine    Patient stated "possible" due to mother and grandparents being allergic as well. Per medical history form dated 01/18/11. Prefers not to have.       Medication List    TAKE these medications   albuterol 108 (90 Base) MCG/ACT inhaler Commonly known as:  PROVENTIL HFA;VENTOLIN HFA Inhale into the lungs every 6 (six) hours as needed for wheezing or shortness of breath.   azelastine 0.1 % nasal spray Commonly known as:  ASTELIN Place 1 spray into both nostrils 2 (two) times daily. Use in each nostril as directed   busPIRone 5 MG tablet Commonly known as:  BUSPAR Take 5 mg by mouth 2 (two) times daily as needed (FOR ANXIETY).   EPINEPHrine 0.3 mg/0.3 mL Soaj injection Commonly known as:  EPI-PEN Inject 0.3 mg into the muscle daily as needed (FOR ANAPHYLATIC ALLERGIC REACTION).   estradiol 0.1 MG/GM vaginal cream Commonly known as:  ESTRACE Place 1 Applicatorful vaginally 3 (three) times a week. Mondays, Wednesdays, Fridays   fluticasone 50 MCG/ACT nasal spray Commonly known as:  FLONASE Place 1 spray into both nostrils at bedtime.   gabapentin 300 MG capsule Commonly known as:  NEURONTIN Take 600 mg by mouth 3 (three) times daily.   HYDROmorphone 2 MG tablet Commonly known as:  DILAUDID Take 1 tablet (2 mg total) by mouth every 3 (three) hours as needed for severe pain.   ibuprofen 200 MG tablet Commonly known as:  ADVIL,MOTRIN Take 400 mg by mouth every 6 (six) hours as needed for pain.   Ketorolac Tromethamine 15.75 MG/SPRAY Soln Place 1 spray into the nose once.   levocetirizine 5 MG tablet Commonly known as:  XYZAL Take 5 mg by mouth every evening.   LEVOXYL 137 MCG tablet Generic drug:  levothyroxine Take 137 mcg by mouth daily before breakfast.   metroNIDAZOLE 0.75 % cream Commonly known as:  METROCREAM Apply 1 application topically at bedtime.   montelukast 10 MG tablet Commonly known as:  SINGULAIR Take 10 mg by mouth at bedtime.   omeprazole 40 MG capsule Commonly known as:  PRILOSEC Take 40 mg by mouth at bedtime.   PRESCRIPTION MEDICATION Inject 1 Syringe into the muscle once a week. Allergy  shot      Follow-up Information    Tracy Kinner, MD. Schedule an appointment as soon as possible for a visit in 2 week(s).   Specialty:  Obstetrics and Gynecology Contact information: [address], [place] [address] [place]  [address] [phone]           Signed: Blane Ohara Davell Beckstead 02/22/2017, 7:46 AM

## 2017-02-22 NOTE — Progress Notes (Signed)
1 Day Post-Op Procedure(s) (LRB): HYSTERECTOMY VAGINAL (Bilateral) ANTERIOR (CYSTOCELE) AND POSTERIOR REPAIR (RECTOCELE) (N/A) Solyx Sling (N/A) BILATERAL SALPINGECTOMY (N/A)  Subjective: Patient reports incisional pain and tolerating PO.  Dilaudid helps with pain  Objective: I have reviewed patient's vital signs, intake and output and labs.  General: alert GI: soft Vaginal Bleeding: minimal, vaginal packing and foley removed  Assessment: s/p Procedure(s): HYSTERECTOMY VAGINAL (Bilateral) ANTERIOR (CYSTOCELE) AND POSTERIOR REPAIR (RECTOCELE) (N/A) Solyx Sling (N/A) BILATERAL SALPINGECTOMY (N/A): stable and progressing well  Plan: Advance diet Encourage ambulation Discontinue IV fluids Discharge home if she voids and pain is controlled  LOS: 0 days    Clarene Duke 02/22/2017, 7:25 AM

## 2017-02-26 ENCOUNTER — Encounter (HOSPITAL_COMMUNITY): Payer: Self-pay | Admitting: Obstetrics and Gynecology

## 2017-03-06 DIAGNOSIS — J301 Allergic rhinitis due to pollen: Secondary | ICD-10-CM | POA: Diagnosis not present

## 2017-03-12 DIAGNOSIS — J301 Allergic rhinitis due to pollen: Secondary | ICD-10-CM | POA: Diagnosis not present

## 2017-03-19 DIAGNOSIS — J301 Allergic rhinitis due to pollen: Secondary | ICD-10-CM | POA: Diagnosis not present

## 2017-03-27 DIAGNOSIS — J301 Allergic rhinitis due to pollen: Secondary | ICD-10-CM | POA: Diagnosis not present

## 2017-04-02 DIAGNOSIS — J301 Allergic rhinitis due to pollen: Secondary | ICD-10-CM | POA: Diagnosis not present

## 2017-04-09 DIAGNOSIS — J301 Allergic rhinitis due to pollen: Secondary | ICD-10-CM | POA: Diagnosis not present

## 2017-04-17 DIAGNOSIS — J301 Allergic rhinitis due to pollen: Secondary | ICD-10-CM | POA: Diagnosis not present

## 2017-04-25 DIAGNOSIS — J301 Allergic rhinitis due to pollen: Secondary | ICD-10-CM | POA: Diagnosis not present

## 2017-05-02 DIAGNOSIS — J301 Allergic rhinitis due to pollen: Secondary | ICD-10-CM | POA: Diagnosis not present

## 2017-05-09 DIAGNOSIS — J301 Allergic rhinitis due to pollen: Secondary | ICD-10-CM | POA: Diagnosis not present

## 2017-05-15 DIAGNOSIS — E039 Hypothyroidism, unspecified: Secondary | ICD-10-CM | POA: Diagnosis not present

## 2017-05-15 DIAGNOSIS — J301 Allergic rhinitis due to pollen: Secondary | ICD-10-CM | POA: Diagnosis not present

## 2017-05-17 ENCOUNTER — Other Ambulatory Visit: Payer: Self-pay | Admitting: Family Medicine

## 2017-05-17 ENCOUNTER — Ambulatory Visit
Admission: RE | Admit: 2017-05-17 | Discharge: 2017-05-17 | Disposition: A | Discharge status: Home / Self Care | Payer: PPO | Source: Ambulatory Visit | Attending: Family Medicine | Admitting: Family Medicine

## 2017-05-17 DIAGNOSIS — Z23 Encounter for immunization: Secondary | ICD-10-CM | POA: Diagnosis not present

## 2017-05-17 DIAGNOSIS — R0781 Pleurodynia: Secondary | ICD-10-CM | POA: Diagnosis not present

## 2017-05-17 DIAGNOSIS — T148XXA Other injury of unspecified body region, initial encounter: Secondary | ICD-10-CM

## 2017-05-17 DIAGNOSIS — E039 Hypothyroidism, unspecified: Secondary | ICD-10-CM | POA: Diagnosis not present

## 2017-05-17 DIAGNOSIS — S299XXA Unspecified injury of thorax, initial encounter: Secondary | ICD-10-CM | POA: Diagnosis not present

## 2017-05-23 DIAGNOSIS — J301 Allergic rhinitis due to pollen: Secondary | ICD-10-CM | POA: Diagnosis not present

## 2017-05-31 DIAGNOSIS — J301 Allergic rhinitis due to pollen: Secondary | ICD-10-CM | POA: Diagnosis not present

## 2017-06-04 DIAGNOSIS — N952 Postmenopausal atrophic vaginitis: Secondary | ICD-10-CM | POA: Diagnosis not present

## 2017-06-06 DIAGNOSIS — J301 Allergic rhinitis due to pollen: Secondary | ICD-10-CM | POA: Diagnosis not present

## 2017-06-12 DIAGNOSIS — J301 Allergic rhinitis due to pollen: Secondary | ICD-10-CM | POA: Diagnosis not present

## 2017-06-13 DIAGNOSIS — J301 Allergic rhinitis due to pollen: Secondary | ICD-10-CM | POA: Diagnosis not present

## 2017-06-19 DIAGNOSIS — J301 Allergic rhinitis due to pollen: Secondary | ICD-10-CM | POA: Diagnosis not present

## 2017-06-26 DIAGNOSIS — J301 Allergic rhinitis due to pollen: Secondary | ICD-10-CM | POA: Diagnosis not present

## 2017-07-02 DIAGNOSIS — E039 Hypothyroidism, unspecified: Secondary | ICD-10-CM | POA: Diagnosis not present

## 2017-07-03 DIAGNOSIS — J301 Allergic rhinitis due to pollen: Secondary | ICD-10-CM | POA: Diagnosis not present

## 2017-07-04 DIAGNOSIS — G43909 Migraine, unspecified, not intractable, without status migrainosus: Secondary | ICD-10-CM | POA: Diagnosis not present

## 2017-07-04 DIAGNOSIS — J309 Allergic rhinitis, unspecified: Secondary | ICD-10-CM | POA: Diagnosis not present

## 2017-07-04 DIAGNOSIS — E039 Hypothyroidism, unspecified: Secondary | ICD-10-CM | POA: Diagnosis not present

## 2017-07-04 DIAGNOSIS — R21 Rash and other nonspecific skin eruption: Secondary | ICD-10-CM | POA: Diagnosis not present

## 2017-07-06 DIAGNOSIS — J301 Allergic rhinitis due to pollen: Secondary | ICD-10-CM | POA: Diagnosis not present

## 2017-07-10 DIAGNOSIS — J3089 Other allergic rhinitis: Secondary | ICD-10-CM | POA: Diagnosis not present

## 2017-07-10 DIAGNOSIS — J301 Allergic rhinitis due to pollen: Secondary | ICD-10-CM | POA: Diagnosis not present

## 2017-07-12 DIAGNOSIS — J301 Allergic rhinitis due to pollen: Secondary | ICD-10-CM | POA: Diagnosis not present

## 2017-07-16 DIAGNOSIS — J301 Allergic rhinitis due to pollen: Secondary | ICD-10-CM | POA: Diagnosis not present

## 2017-07-23 DIAGNOSIS — J301 Allergic rhinitis due to pollen: Secondary | ICD-10-CM | POA: Diagnosis not present

## 2017-07-31 DIAGNOSIS — J301 Allergic rhinitis due to pollen: Secondary | ICD-10-CM | POA: Diagnosis not present

## 2017-08-06 DIAGNOSIS — J301 Allergic rhinitis due to pollen: Secondary | ICD-10-CM | POA: Diagnosis not present

## 2017-08-16 DIAGNOSIS — J3089 Other allergic rhinitis: Secondary | ICD-10-CM | POA: Diagnosis not present

## 2017-08-16 DIAGNOSIS — J301 Allergic rhinitis due to pollen: Secondary | ICD-10-CM | POA: Diagnosis not present

## 2017-08-21 DIAGNOSIS — J301 Allergic rhinitis due to pollen: Secondary | ICD-10-CM | POA: Diagnosis not present

## 2017-08-21 DIAGNOSIS — E039 Hypothyroidism, unspecified: Secondary | ICD-10-CM | POA: Diagnosis not present

## 2017-08-24 DIAGNOSIS — R030 Elevated blood-pressure reading, without diagnosis of hypertension: Secondary | ICD-10-CM | POA: Diagnosis not present

## 2017-08-24 DIAGNOSIS — E039 Hypothyroidism, unspecified: Secondary | ICD-10-CM | POA: Diagnosis not present

## 2017-08-24 DIAGNOSIS — Z0000 Encounter for general adult medical examination without abnormal findings: Secondary | ICD-10-CM | POA: Diagnosis not present

## 2017-08-24 DIAGNOSIS — F411 Generalized anxiety disorder: Secondary | ICD-10-CM | POA: Diagnosis not present

## 2017-08-30 ENCOUNTER — Other Ambulatory Visit: Payer: Self-pay | Admitting: Family Medicine

## 2017-08-30 DIAGNOSIS — R5381 Other malaise: Secondary | ICD-10-CM

## 2017-08-30 DIAGNOSIS — J301 Allergic rhinitis due to pollen: Secondary | ICD-10-CM | POA: Diagnosis not present

## 2017-08-31 ENCOUNTER — Other Ambulatory Visit: Payer: Self-pay | Admitting: Family Medicine

## 2017-08-31 DIAGNOSIS — E2839 Other primary ovarian failure: Secondary | ICD-10-CM

## 2017-09-05 DIAGNOSIS — J3089 Other allergic rhinitis: Secondary | ICD-10-CM | POA: Diagnosis not present

## 2017-09-05 DIAGNOSIS — J301 Allergic rhinitis due to pollen: Secondary | ICD-10-CM | POA: Diagnosis not present

## 2017-09-10 DIAGNOSIS — I10 Essential (primary) hypertension: Secondary | ICD-10-CM | POA: Diagnosis not present

## 2017-09-10 DIAGNOSIS — Z6829 Body mass index (BMI) 29.0-29.9, adult: Secondary | ICD-10-CM | POA: Diagnosis not present

## 2017-09-10 DIAGNOSIS — G43909 Migraine, unspecified, not intractable, without status migrainosus: Secondary | ICD-10-CM | POA: Diagnosis not present

## 2017-09-10 DIAGNOSIS — M25552 Pain in left hip: Secondary | ICD-10-CM | POA: Diagnosis not present

## 2017-09-13 DIAGNOSIS — J301 Allergic rhinitis due to pollen: Secondary | ICD-10-CM | POA: Diagnosis not present

## 2017-09-19 ENCOUNTER — Encounter: Payer: Self-pay | Admitting: Physical Therapy

## 2017-09-19 ENCOUNTER — Ambulatory Visit: Payer: PPO | Attending: Family Medicine | Admitting: Physical Therapy

## 2017-09-19 DIAGNOSIS — M25552 Pain in left hip: Secondary | ICD-10-CM | POA: Diagnosis not present

## 2017-09-19 DIAGNOSIS — G8929 Other chronic pain: Secondary | ICD-10-CM | POA: Insufficient documentation

## 2017-09-19 DIAGNOSIS — M6281 Muscle weakness (generalized): Secondary | ICD-10-CM | POA: Insufficient documentation

## 2017-09-19 DIAGNOSIS — M545 Low back pain: Secondary | ICD-10-CM | POA: Diagnosis not present

## 2017-09-19 DIAGNOSIS — M62838 Other muscle spasm: Secondary | ICD-10-CM | POA: Diagnosis not present

## 2017-09-19 NOTE — Therapy (Signed)
[facility] [address] [place], [place], [address] Phone: [phone]   Fax:  [phone]  Physical Therapy Evaluation  Patient Details  Name: Alyssa Fisher MRN: [id number] Date of Birth: [birth year] Referring Provider: Rachell Cipro, MD    Encounter Date: 09/19/2017  PT End of Session - 09/19/17 1019    Visit Number  1    Number of Visits  16    Date for PT Re-Evaluation  11/14/17    Authorization Time Period  09/19/17 to 11/14/17    PT Start Time  0850    PT Stop Time  0930    PT Time Calculation (min)  40 min    Activity Tolerance  Patient tolerated treatment well;No increased pain    Behavior During Therapy  WFL for tasks assessed/performed       Past Medical History:  Diagnosis Date  . Anxiety   . Broken ankle   . Dysphagia 10-23-12   due to thyroid issues  . GERD (gastroesophageal reflux disease)   . History of tonsillectomy   . Hypothyroidism   . Miscarriage    One - date not on medical form.  . Mitral valve prolapse 10/23/2012  . 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 Rosacea   . Seasonal allergies    uses albuterol inhaler as needed for allergies  . SVD (spontaneous vaginal delivery)    x 5  . Thyroid goiter 10-23-12   multinodular- has been followed x 68yrs    Past Surgical History:  Procedure Laterality Date  . ANTERIOR AND POSTERIOR REPAIR N/A 02/21/2017   Procedure: ANTERIOR (CYSTOCELE) AND POSTERIOR REPAIR (RECTOCELE);  Surgeon: Cheri Fowler, MD;  Location: [facility];  Service: Gynecology;  Laterality: N/A;  . BILATERAL SALPINGECTOMY N/A 02/21/2017   Procedure: BILATERAL SALPINGECTOMY;  Surgeon: Cheri Fowler, MD;  Location: [facility];  Service: Gynecology;  Laterality: N/A;  . BLADDER SUSPENSION N/A 02/21/2017   Procedure: Solyx Sling;  Surgeon: Cheri Fowler, MD;  Location: [facility];  Service: Gynecology;  Laterality: N/A;  . broken ankle Right    Pins/rod  inserted-retained  . COLONOSCOPY    . DILATION AND CURETTAGE OF UTERUS     MAB  . THYROIDECTOMY N/A 10/29/2012   Procedure: THYROIDECTOMY;  Surgeon: Earnstine Regal, MD;  Location: [facility];  Service: General;  Laterality: N/A;  Total Thyroidectomy  . TONSILLECTOMY    . VAGINAL HYSTERECTOMY Bilateral 02/21/2017   Procedure: HYSTERECTOMY VAGINAL;  Surgeon: Cheri Fowler, MD;  Location: [facility];  Service: Gynecology;  Laterality: Bilateral;  . WISDOM TOOTH EXTRACTION      There were no vitals filed for this visit.   Subjective Assessment - 09/19/17 0903    Subjective  Back in 2011, pt reports that she was going downstairs and missed a step. She fell onto her Lt side. Following that, she noticed Lt low back/buttock pain primarily. Since then, her pain has traveled down the hip and down to the knee/foot over several years.     Pertinent History  Legally blind (very narrow vision)    Limitations  Other (comment) going up/down stairs    Currently in Pain?  Yes    Pain Score  5     Pain Location  Hip    Pain Orientation  Left    Pain Descriptors / Indicators  Aching    Pain Type  Chronic pain    Pain Radiating Towards  down lateral thigh to  knee and across top of the Lt foot    Pain Onset  More than a month ago    Pain Frequency  Intermittent    Aggravating Factors   getting down/up from the floor; up stairs worse than Lt, rolling onto the Lt side    Pain Relieving Factors  avoiding movement    Effect of Pain on Daily Activities  moderate; unable to get down on the floor to play with grandkids          [facility] PT Assessment - 09/19/17 0001      Assessment   Medical Diagnosis  Lt hip pain     Referring Provider  Rachell Cipro, MD     Onset Date/Surgical Date  -- 2011    Next MD Visit  not sure for this     Prior Therapy  OPPT maybe 4-5 years ago which didn't help the lower back, just the flexibility of the hip       Precautions   Precautions  Other (comment)    Precaution Comments   legally blind       Balance Screen   Has the patient fallen in the past 6 months  No    Has the patient had a decrease in activity level because of a fear of falling?   No    Is the patient reluctant to leave their home because of a fear of falling?   No      Prior Function   Level of Independence  Independent      Observation/Other Assessments   Observations  pt using guide stick to get back to treatment room     Focus on Therapeutic Outcomes (FOTO)   58% limited  goal 45% limited       Posture/Postural Control   Posture Comments  seated slouched, forward head, rounded shoulders, decreased lumbar lordosis       ROM / Strength   AROM / PROM / Strength  AROM;Strength      AROM   AROM Assessment Site  Lumbar    Lumbar Flexion  50% limited; pain peripheralized to Lt foot    Lumbar Extension  50% limited, pain remained in Lt foot      Strength   Strength Assessment Site  Hip;Knee;Ankle    Right/Left Hip  Right;Left    Right Hip Extension  3+/5    Right Hip ABduction  3/5 pain with resistance     Left Hip Flexion  3+/5    Left Hip Extension  3/5    Left Hip ABduction  4/5    Right/Left Knee  Right;Left    Right Knee Flexion  3+/5    Right Knee Extension  5/5    Left Knee Flexion  3+/5    Left Knee Extension  5/5    Right/Left Ankle  Right;Left    Right Ankle Dorsiflexion  5/5    Left Ankle Dorsiflexion  5/5      Palpation   Palpation comment  tenderness along Lt gluteals, Lt ITB/TFL, Lt lateral qudariceps      Transfers   Five time sit to stand comments   12 sec (+) knee valgus greater on the Lt              Objective measurements completed on examination: See above findings.      [facility] Adult PT Treatment/Exercise - 09/19/17 0001      Exercises   Exercises  Lumbar      Lumbar Exercises:  Standing   Other Standing Lumbar Exercises  repeated extension in standing x10 reps (pain not changed)       Lumbar Exercises: Prone   Other Prone Lumbar Exercises   prone press up on elbows without over pressure x15 reps (pain centralized to Lt knee only)       Manual Therapy   Manual Therapy  Joint mobilization    Joint Mobilization  Overpressure applied during pressups on elbows x15 reps. pain nearly resolved             PT Education - 09/19/17 1018    Education provided  Yes    Education Details  eval findings/POC; implications for press ups; need to rule out other possible causes of pain/symptoms next session    Person(s) Educated  Patient    Methods  Explanation;Handout    Comprehension  Verbalized understanding;Returned demonstration       PT Short Term Goals - 09/19/17 1123      PT SHORT TERM GOAL #1   Title  Pt will demo consistency and independence with her HEP to decrease pain and improve LE strength.    Time  4    Period  Weeks    Status  New    Target Date  10/17/17      PT SHORT TERM GOAL #2   Title  Pt will demo proper placement and use of lumbar roll while seated, to improve posture and pain with sittitng throughout the day.     Time  4    Period  Weeks    Status  New      PT SHORT TERM GOAL #3   Title  Pt will report atleast 25% improvement in her pain/symptoms from the start of therapy, to allow her to complete daily activity.     Time  4    Period  Weeks    Status  New        PT Long Term Goals - 09/19/17 1126      PT LONG TERM GOAL #1   Title  Pt will demo improved BLE strength to atleast 4/5 MMT to improve her efficiency and safety with daily activity.     Time  8    Period  Weeks    Status  New    Target Date  11/14/17      PT LONG TERM GOAL #2   Title  Pt will be able to get on/off the floor with no more than therapist cuing for hand/foot placement, 2/3 trials, which will increase her ability to interract with her grandchildren.     Time  8    Period  Weeks    Status  New      PT LONG TERM GOAL #3   Title  Pt will report atleast 50% reduction in her pain/symptoms from the start of therapy, to  increase her quality of life and allow her to complete ADLs without need for assistance.     Time  8    Period  Weeks    Status  New      PT LONG TERM GOAL #4   Title  Pt will be able to ascend/descend 1 flight of stairs with no more than 2/10 pain report, to allow her to get around her house without difficulty.    Time  8    Period  Weeks    Status  New      PT LONG TERM GOAL #5   Title  Pt will demo improved hip strength and stability evident by her ability to complete 5x sit to stand in less than 13 sec (without UE support) and without knee valgus deviation.     Time  8    Period  Weeks    Status  New             Plan - 09/19/17 1032    Clinical Impression Statement  Pt is a Alyssa 51 y.o F referred to OPPT with complaints of chronic Lt sided low back/buttock pain worsening since a fall back in 2011. She presents with symptoms down to the Lt foot which are worsened during lumbar flexion movements. In addition, her symptoms below the knee were nearly resolved with repeated extension in prone position during today's session. Pt does have additional hip weakness with pain and tenderness along the ITB/greater trochanter region which is worsened with resisted hip abduction. She also demonstrates knee valgus deviation with functional activity such as sit to stand which could be contributing to her pain. Pt would benefit from skilled PT to address her limitations in flexibility, lumbar/hip ROM, hip strength and decrease muscle spasm/pain to allow her to complete daily activity and interact with her grandchildren without significant difficulty.     Clinical Presentation  Evolving    Clinical Presentation due to:  gradually worsended over time     Clinical Decision Making  Low    Rehab Potential  Good    PT Frequency  2x / week    PT Duration  8 weeks    PT Treatment/Interventions  ADLs/Self Care Home Management;Cryotherapy;Electrical Stimulation;Moist Heat;Traction;Iontophoresis 4mg /ml  Dexamethasone;Gait training;Neuromuscular re-education;Stair training;Functional mobility training;Therapeutic activities;Therapeutic exercise;Patient/family education;Manual techniques;Taping;Dry needling;Passive range of motion    PT Next Visit Plan  slump/SLR testing; hip ROM/flexibility; follow up with pressups response    PT Home Exercise Plan  prone press up on elbows     Consulted and Agree with Plan of Care  Patient       Patient will benefit from skilled therapeutic intervention in order to improve the following deficits and impairments:  Abnormal gait, Decreased activity tolerance, Difficulty walking, Impaired flexibility, Decreased strength, Decreased range of motion, Increased muscle spasms, Postural dysfunction, Pain, Decreased mobility, Impaired sensation  Visit Diagnosis: Chronic left-sided low back pain, with sciatica presence unspecified  Pain in left hip  Muscle weakness (generalized)  Other muscle spasm     Problem List Patient Active Problem List   Diagnosis Date Noted  . Pelvic relaxation 02/21/2017  . 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 Dysphagia 03/01/2011  . Night sweats 02/20/2011  . Fatigue/Loss of Sleep 02/20/2011  . Mitral valve prolapse 02/20/2011  . FHx: allergies 02/20/2011  . Multiple thyroid nodules 02/20/2011  . Rash/bruises easily 02/20/2011  . Dizziness - light-headed/fainting 02/20/2011  . Generalized headaches 02/20/2011  . Retinitis pigmentosa 02/20/2011  . Sinus problem 02/20/2011  . Runny nose 02/20/2011  . Hoarseness/sore throat 02/20/2011  . Wears glasses/contacts 02/20/2011  . Arthritis 02/20/2011  . Family history of breast cancer 02/20/2011    11:32 AM,09/19/17 Sherol Dade PT, DPT [facility] [address] [place], [place], [address] Phone: [phone]   Fax:  [phone]  Name: Alyssa Fisher MRN: [id number] Date of Birth: 29-Nov-1965

## 2017-09-19 NOTE — Patient Instructions (Signed)
   Back Extension - On Elbows  On your elbows Eventually progress by adding a pillow under elbows Eventually progress to Press Ups x10 reps every 2-3 hours   [facility] [address] [place], [place] [address] Phone # [phone] Fax [phone]

## 2017-09-20 DIAGNOSIS — J301 Allergic rhinitis due to pollen: Secondary | ICD-10-CM | POA: Diagnosis not present

## 2017-09-25 ENCOUNTER — Encounter: Payer: Self-pay | Admitting: Physical Therapy

## 2017-09-25 ENCOUNTER — Ambulatory Visit: Payer: PPO | Admitting: Physical Therapy

## 2017-09-25 DIAGNOSIS — G8929 Other chronic pain: Secondary | ICD-10-CM

## 2017-09-25 DIAGNOSIS — M62838 Other muscle spasm: Secondary | ICD-10-CM

## 2017-09-25 DIAGNOSIS — M545 Low back pain: Secondary | ICD-10-CM | POA: Diagnosis not present

## 2017-09-25 DIAGNOSIS — M25552 Pain in left hip: Secondary | ICD-10-CM

## 2017-09-25 DIAGNOSIS — M6281 Muscle weakness (generalized): Secondary | ICD-10-CM

## 2017-09-25 NOTE — Therapy (Signed)
[facility] [address] [place], [place], [address] Phone: [phone]   Fax:  [phone]  Physical Therapy Treatment  Patient Details  Name: Alyssa Fisher MRN: [id number] Date of Birth: 13-Feb-1966 Referring Provider: Rachell Cipro, MD    Encounter Date: 09/25/2017  PT End of Session - 09/25/17 1234    Visit Number  2    Number of Visits  16    Date for PT Re-Evaluation  11/14/17    Authorization Time Period  09/19/17 to 11/14/17    PT Start Time  1147    PT Stop Time  1230    PT Time Calculation (min)  43 min    Activity Tolerance  Patient tolerated treatment well;No increased pain    Behavior During Therapy  WFL for tasks assessed/performed       Past Medical History:  Diagnosis Date  . Anxiety   . Broken ankle   . Dysphagia 10-23-12   due to thyroid issues  . GERD (gastroesophageal reflux disease)   . History of tonsillectomy   . Hypothyroidism   . Miscarriage    One - date not on medical form.  . Mitral valve prolapse 10/23/2012  . 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 Rosacea   . Seasonal allergies    uses albuterol inhaler as needed for allergies  . SVD (spontaneous vaginal delivery)    x 5  . Thyroid goiter 10-23-12   multinodular- has been followed x 79yrs    Past Surgical History:  Procedure Laterality Date  . ANTERIOR AND POSTERIOR REPAIR N/A 02/21/2017   Procedure: ANTERIOR (CYSTOCELE) AND POSTERIOR REPAIR (RECTOCELE);  Surgeon: Cheri Fowler, MD;  Location: [facility];  Service: Gynecology;  Laterality: N/A;  . BILATERAL SALPINGECTOMY N/A 02/21/2017   Procedure: BILATERAL SALPINGECTOMY;  Surgeon: Cheri Fowler, MD;  Location: [facility];  Service: Gynecology;  Laterality: N/A;  . BLADDER SUSPENSION N/A 02/21/2017   Procedure: Solyx Sling;  Surgeon: Cheri Fowler, MD;  Location: [facility];  Service: Gynecology;  Laterality: N/A;  . broken ankle Right    Pins/rod  inserted-retained  . COLONOSCOPY    . DILATION AND CURETTAGE OF UTERUS     MAB  . THYROIDECTOMY N/A 10/29/2012   Procedure: THYROIDECTOMY;  Surgeon: Earnstine Regal, MD;  Location: [facility];  Service: General;  Laterality: N/A;  Total Thyroidectomy  . TONSILLECTOMY    . VAGINAL HYSTERECTOMY Bilateral 02/21/2017   Procedure: HYSTERECTOMY VAGINAL;  Surgeon: Cheri Fowler, MD;  Location: [facility];  Service: Gynecology;  Laterality: Bilateral;  . WISDOM TOOTH EXTRACTION      There were no vitals filed for this visit.  Subjective Assessment - 09/25/17 1149    Subjective  Pt reports that things are going ok. She has been completing her HEP and feels that there has been little to no change overall in her pain intensity.     Pertinent History  Legally blind (very narrow vision)    Limitations  Other (comment) going up/down stairs    Currently in Pain?  No/denies none sitting right now.     Pain Onset  More than a month ago         [facility] PT Assessment - 09/25/17 0001      Flexibility   Soft Tissue Assessment /Muscle Length  yes      Special Tests    Special Tests  Lumbar    Other special tests  (+) thomas test  Lt/Rt    Lumbar Tests  Slump Test;Straight Leg Raise      Slump test   Findings  Positive    Side  Right      Straight Leg Raise   Findings  Positive    Side   Right                  [facility] Adult PT Treatment/Exercise - 09/25/17 0001      Self-Care   Self-Care  Posture;Other Self-Care Comments    Posture  use of lumbar roll for additional low back support    Other Self-Care Comments   ice/heat parameters, use of tennis ball for self massage      Lumbar Exercises: Supine   Clam  10 reps;Limitations    Clam Limitations  x1 set, increased to green TB for 2nd set completed on pt's side    Other Supine Lumbar Exercises  Lt nerve flossing x20 reps      Manual Therapy   Manual Therapy  Soft tissue mobilization    Soft tissue mobilization  soft tissue mobilization Lt  glute med/TFL              PT Education - 09/25/17 1232    Education provided  Yes    Education Details  differences noted between muscle/nerve pain; additions to HEP; set up and use of lumbar roll for additional low back support; benefits of self massage at home to gluteals     Person(s) Educated  Patient    Methods  Explanation;Handout    Comprehension  Verbalized understanding;Returned demonstration       PT Short Term Goals - 09/19/17 1123      PT SHORT TERM GOAL #1   Title  Pt will demo consistency and independence with her HEP to decrease pain and improve LE strength.    Time  4    Period  Weeks    Status  New    Target Date  10/17/17      PT SHORT TERM GOAL #2   Title  Pt will demo proper placement and use of lumbar roll while seated, to improve posture and pain with sittitng throughout the day.     Time  4    Period  Weeks    Status  New      PT SHORT TERM GOAL #3   Title  Pt will report atleast 25% improvement in her pain/symptoms from the start of therapy, to allow her to complete daily activity.     Time  4    Period  Weeks    Status  New        PT Long Term Goals - 09/19/17 1126      PT LONG TERM GOAL #1   Title  Pt will demo improved BLE strength to atleast 4/5 MMT to improve her efficiency and safety with daily activity.     Time  8    Period  Weeks    Status  New    Target Date  11/14/17      PT LONG TERM GOAL #2   Title  Pt will be able to get on/off the floor with no more than therapist cuing for hand/foot placement, 2/3 trials, which will increase her ability to interract with her grandchildren.     Time  8    Period  Weeks    Status  New      PT LONG TERM GOAL #3   Title  Pt will report atleast 50% reduction in her pain/symptoms from the start of therapy, to increase her quality of life and allow her to complete ADLs without need for assistance.     Time  8    Period  Weeks    Status  New      PT LONG TERM GOAL #4   Title  Pt will be  able to ascend/descend 1 flight of stairs with no more than 2/10 pain report, to allow her to get around her house without difficulty.    Time  8    Period  Weeks    Status  New      PT LONG TERM GOAL #5   Title  Pt will demo improved hip strength and stability evident by her ability to complete 5x sit to stand in less than 13 sec (without UE support) and without knee valgus deviation.     Time  8    Period  Weeks    Status  New            Plan - 09/25/17 1251    Clinical Impression Statement  Today was the pt's first visit since her evaluation, so minimal progress towards goals is expected. Slump and passive straight leg raise was positive on the Lt, however this only reproduced a portion of her LLE pain. The pain along the lateral aspect of her hip/greater trochanter region was greatly irritated with palpation to the area and is likely a second source of her symptoms. Therapist completed soft tissue mobilization to the area in order to decrease muscle spasm and educated pt on sitting posture and nerve glides and other HEP for home. She reported no increase in her pain following today's session and verbalized better understanding of symptoms and impairments overall.     Rehab Potential  Good    PT Frequency  2x / week    PT Duration  8 weeks    PT Treatment/Interventions  ADLs/Self Care Home Management;Cryotherapy;Electrical Stimulation;Moist Heat;Traction;Iontophoresis 4mg /ml Dexamethasone;Gait training;Neuromuscular re-education;Stair training;Functional mobility training;Therapeutic activities;Therapeutic exercise;Patient/family education;Manual techniques;Taping;Dry needling;Passive range of motion    PT Next Visit Plan  prone pressups (if centralizing complete with over pressure); lumbar extension based exercises; hip strengthening and soft tissue techniques as needed to decrease pain; discuss possible dry needling to hip/buttock area    PT Home Exercise Plan  prone press up on elbows;  side clams with green TB, LE nerve flossing, lumbar roll     Consulted and Agree with Plan of Care  Patient       Patient will benefit from skilled therapeutic intervention in order to improve the following deficits and impairments:  Abnormal gait, Decreased activity tolerance, Difficulty walking, Impaired flexibility, Decreased strength, Decreased range of motion, Increased muscle spasms, Postural dysfunction, Pain, Decreased mobility, Impaired sensation  Visit Diagnosis: Chronic left-sided low back pain, with sciatica presence unspecified  Pain in left hip  Muscle weakness (generalized)  Other muscle spasm     Problem List Patient Active Problem List   Diagnosis Date Noted  . Pelvic relaxation 02/21/2017  . 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 Dysphagia 03/01/2011  . Night sweats 02/20/2011  . Fatigue/Loss of Sleep 02/20/2011  . Mitral valve prolapse 02/20/2011  . FHx: allergies 02/20/2011  . Multiple thyroid nodules 02/20/2011  . Rash/bruises easily 02/20/2011  . Dizziness - light-headed/fainting 02/20/2011  . Generalized headaches 02/20/2011  . Retinitis pigmentosa 02/20/2011  . Sinus problem 02/20/2011  . Runny nose 02/20/2011  . Hoarseness/sore throat 02/20/2011  .  Wears glasses/contacts 02/20/2011  . Arthritis 02/20/2011  . Family history of breast cancer 02/20/2011    1:02 PM,09/25/17 Sherol Dade PT, DPT [place] [facility] [address], [place], [place], [address] Phone: [phone]   Fax:  [phone]  Name: CHANAY NUGENT MRN: [id number] Date of Birth: [birth year]

## 2017-09-25 NOTE — Patient Instructions (Addendum)
   Nerve Glides  Begin by lying flat on your back. Holding onto one leg from behind the knee (other leg is straight),   Position 1: Straighten leg out as far as is comfortable keeping the foot relaxed. Hold for 1 second at the top and relax back with control.  Position 2: Keep the foot flexed toward you while trying to straighten out the leg as far as is comfortable.  x10 reps on the left.     Clamshells  Start: Position yourself in fetal position.  Movement: Brace your core and lift your knee off of the other (like a clamshell) Keep your ankles together and focus on maintaining good core stability so your hips do not rock back.  Slowly return to start position and repeat 10x.   Green band around knees.     [facility] [address], [place], [place] [address] Phone # [phone] Fax [phone]

## 2017-09-27 ENCOUNTER — Encounter: Payer: PPO | Admitting: Physical Therapy

## 2017-09-28 DIAGNOSIS — J301 Allergic rhinitis due to pollen: Secondary | ICD-10-CM | POA: Diagnosis not present

## 2017-10-02 ENCOUNTER — Ambulatory Visit: Payer: PPO | Admitting: Physical Therapy

## 2017-10-02 ENCOUNTER — Encounter: Payer: Self-pay | Admitting: Physical Therapy

## 2017-10-02 DIAGNOSIS — M62838 Other muscle spasm: Secondary | ICD-10-CM

## 2017-10-02 DIAGNOSIS — M545 Low back pain: Secondary | ICD-10-CM | POA: Diagnosis not present

## 2017-10-02 DIAGNOSIS — M6281 Muscle weakness (generalized): Secondary | ICD-10-CM

## 2017-10-02 DIAGNOSIS — G8929 Other chronic pain: Secondary | ICD-10-CM

## 2017-10-02 DIAGNOSIS — M25552 Pain in left hip: Secondary | ICD-10-CM

## 2017-10-02 NOTE — Therapy (Signed)
[facility] [address] [place], [place], [address] Phone: [phone]   Fax:  [phone]  Physical Therapy Treatment  Patient Details  Name: Alyssa Fisher MRN: [id number] Date of Birth: 22-Nov-1965 Referring Provider: Rachell Cipro, MD    Encounter Date: 10/02/2017  PT End of Session - 10/02/17 1103    Visit Number  3    Number of Visits  16    Date for PT Re-Evaluation  11/14/17    Authorization Time Period  09/19/17 to 11/14/17    PT Start Time  1018    PT Stop Time  1059    PT Time Calculation (min)  41 min    Activity Tolerance  Patient tolerated treatment well;No increased pain    Behavior During Therapy  WFL for tasks assessed/performed       Past Medical History:  Diagnosis Date  . Anxiety   . Broken ankle   . Dysphagia 10-23-12   due to thyroid issues  . GERD (gastroesophageal reflux disease)   . History of tonsillectomy   . Hypothyroidism   . Miscarriage    One - date not on medical form.  . Mitral valve prolapse 10/23/2012  . 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 Rosacea   . Seasonal allergies    uses albuterol inhaler as needed for allergies  . SVD (spontaneous vaginal delivery)    x 5  . Thyroid goiter 10-23-12   multinodular- has been followed x 70yr    Past Surgical History:  Procedure Laterality Date  . ANTERIOR AND POSTERIOR REPAIR N/A 02/21/2017   Procedure: ANTERIOR (CYSTOCELE) AND POSTERIOR REPAIR (RECTOCELE);  Surgeon: MCheri Fowler MD;  [facility];  Service: Gynecology;  Laterality: N/A;  . BILATERAL SALPINGECTOMY N/A 02/21/2017   Procedure: BILATERAL SALPINGECTOMY;  Surgeon: MCheri Fowler MD;  [facility];  Service: Gynecology;  Laterality: N/A;  . BLADDER SUSPENSION N/A 02/21/2017   Procedure: Solyx Sling;  Surgeon: MCheri Fowler MD;  [facility];  Service: Gynecology;  Laterality: N/A;  . broken ankle Right    Pins/rod  inserted-retained  . COLONOSCOPY    . DILATION AND CURETTAGE OF UTERUS     MAB  . THYROIDECTOMY N/A 10/29/2012   Procedure: THYROIDECTOMY;  Surgeon: TEarnstine Regal MD;  [facility];  Service: General;  Laterality: N/A;  Total Thyroidectomy  . TONSILLECTOMY    . VAGINAL HYSTERECTOMY Bilateral 02/21/2017   Procedure: HYSTERECTOMY VAGINAL;  Surgeon: MCheri Fowler MD;  [facility];  Service: Gynecology;  Laterality: Bilateral;  . WISDOM TOOTH EXTRACTION      There were no vitals filed for this visit.  Subjective Assessment - 10/02/17 1020    Subjective  Pt reports that she does not have any pain down to the foot anymore, but her low back and hip region continue to bother her. She has been completing her exercises daily as instructed. She is trying to use her towel roll which helps alot. She has noticed that she will wake up sometimes without any hip pain which is nice.     Pertinent History  Legally blind (very narrow vision)    Limitations  Other (comment) going up/down stairs    Currently in Pain?  Yes    Pain Score  4     Pain Location  -- Lt hip/buttock area    Pain Orientation  Left    Pain Descriptors / Indicators  Aching  Pain Type  Chronic pain    Pain Radiating Towards  none currently    Pain Onset  More than a month ago    Pain Frequency  Intermittent    Aggravating Factors   sitting without the towel roll, up and down stairs, rolling onto the Lt side    Pain Relieving Factors  avoid movement that aggravates, press ups and towel roll help                       [facility] Adult PT Treatment/Exercise - 10/02/17 0001      Lumbar Exercises: Stretches   Figure 4 Stretch  3 reps;30 seconds;Limitations    Figure 4 Stretch Limitations  Lt and Rt     Other Lumbar Stretch Exercise  Lt glute stretch x5 reps       Lumbar Exercises: Standing   Other Standing Lumbar Exercises  repeated extension in standing x20 reps (centralized to midline but no change beyond  this)       Lumbar Exercises: Seated   Other Seated Lumbar Exercises  seated Lt nerve flossing x15 reps       Lumbar Exercises: Supine   Bridge  15 reps    Large Ball Oblique Isometric  15 reps;3 seconds;Limitations    Large Ball Oblique Isometric Limitations  UE/LE press without ball       Lumbar Exercises: Prone   Other Prone Lumbar Exercises  prone press up on forearms x20 reps (10 reps with therapist over pressure)              PT Education - 10/02/17 1101    Education provided  Yes    Education Details  improvements in symptoms and progressions in press-up technique, etc.     Person(s) Educated  Patient    Methods  Explanation    Comprehension  Verbalized understanding       PT Short Term Goals - 10/02/17 1110      PT SHORT TERM GOAL #1   Title  Pt will demo consistency and independence with her HEP to decrease pain and improve LE strength.    Time  4    Period  Weeks    Status  Achieved      PT SHORT TERM GOAL #2   Title  Pt will demo proper placement and use of lumbar roll while seated, to improve posture and pain with sittitng throughout the day.     Time  4    Period  Weeks    Status  Partially Met      PT SHORT TERM GOAL #3   Title  Pt will report atleast 25% improvement in her pain/symptoms from the start of therapy, to allow her to complete daily activity.     Time  4    Period  Weeks    Status  On-going        PT Long Term Goals - 09/19/17 1126      PT LONG TERM GOAL #1   Title  Pt will demo improved BLE strength to atleast 4/5 MMT to improve her efficiency and safety with daily activity.     Time  8    Period  Weeks    Status  New    Target Date  11/14/17      PT LONG TERM GOAL #2   Title  Pt will be able to get on/off the floor with no more than therapist cuing for hand/foot placement, 2/3  trials, which will increase her ability to interract with her grandchildren.     Time  8    Period  Weeks    Status  New      PT LONG TERM GOAL #3    Title  Pt will report atleast 50% reduction in her pain/symptoms from the start of therapy, to increase her quality of life and allow her to complete ADLs without need for assistance.     Time  8    Period  Weeks    Status  New      PT LONG TERM GOAL #4   Title  Pt will be able to ascend/descend 1 flight of stairs with no more than 2/10 pain report, to allow her to get around her house without difficulty.    Time  8    Period  Weeks    Status  New      PT LONG TERM GOAL #5   Title  Pt will demo improved hip strength and stability evident by her ability to complete 5x sit to stand in less than 13 sec (without UE support) and without knee valgus deviation.     Time  8    Period  Weeks    Status  New            Plan - 10/02/17 1103    Clinical Impression Statement  Pt reports overall improvement in radicular symptoms since the start of therapy. She has been responding well to the use of the towel, noting this improved her sitting tolerance at home and at her son's games. Session focused on progressing extension based therex with addition of therapist overpressure, noting further centralization of pt's pain in the low back. Progressed pt's strength and flexibility exercise this session, noting no increase in pain end of today's session.    Rehab Potential  Good    PT Frequency  2x / week    PT Duration  8 weeks    PT Treatment/Interventions  ADLs/Self Care Home Management;Cryotherapy;Electrical Stimulation;Moist Heat;Traction;Iontophoresis 6m/ml Dexamethasone;Gait training;Neuromuscular re-education;Stair training;Functional mobility training;Therapeutic activities;Therapeutic exercise;Patient/family education;Manual techniques;Taping;Dry needling;Passive range of motion    PT Next Visit Plan  f/u on prone pressups on hands); lumbar extension based exercises and therapist overpressure; hip strengthening and soft tissue techniques as needed to decrease pain; discuss possible dry needling  to hip/buttock area    PT Home Exercise Plan  prone press up on elbows; side clams with green TB, LE nerve flossing, lumbar roll     Consulted and Agree with Plan of Care  Patient       Patient will benefit from skilled therapeutic intervention in order to improve the following deficits and impairments:  Abnormal gait, Decreased activity tolerance, Difficulty walking, Impaired flexibility, Decreased strength, Decreased range of motion, Increased muscle spasms, Postural dysfunction, Pain, Decreased mobility, Impaired sensation  Visit Diagnosis: Chronic left-sided low back pain, with sciatica presence unspecified  Pain in left hip  Muscle weakness (generalized)  Other muscle spasm     Problem List Patient Active Problem List   Diagnosis Date Noted  . Pelvic relaxation 02/21/2017  . 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 Dysphagia 03/01/2011  . Night sweats 02/20/2011  . Fatigue/Loss of Sleep 02/20/2011  . Mitral valve prolapse 02/20/2011  . FHx: allergies 02/20/2011  . Multiple thyroid nodules 02/20/2011  . Rash/bruises easily 02/20/2011  . Dizziness - light-headed/fainting 02/20/2011  . Generalized headaches 02/20/2011  . Retinitis pigmentosa 02/20/2011  . Sinus problem 02/20/2011  . Runny  nose 02/20/2011  . Hoarseness/sore throat 02/20/2011  . Wears glasses/contacts 02/20/2011  . Arthritis 02/20/2011  . Family history of breast cancer 02/20/2011    11:11 AM,10/02/17 Sherol Dade PT, DPT [facility] [address] [place], [place], [address] Phone: [phone]   Fax:  [phone]  Name: Alyssa Fisher MRN: [id number] Date of Birth: 1965-12-09

## 2017-10-04 ENCOUNTER — Ambulatory Visit: Payer: PPO | Admitting: Physical Therapy

## 2017-10-04 ENCOUNTER — Encounter: Payer: Self-pay | Admitting: Physical Therapy

## 2017-10-04 DIAGNOSIS — M545 Low back pain: Secondary | ICD-10-CM | POA: Diagnosis not present

## 2017-10-04 DIAGNOSIS — M6281 Muscle weakness (generalized): Secondary | ICD-10-CM

## 2017-10-04 DIAGNOSIS — G8929 Other chronic pain: Secondary | ICD-10-CM

## 2017-10-04 DIAGNOSIS — J301 Allergic rhinitis due to pollen: Secondary | ICD-10-CM | POA: Diagnosis not present

## 2017-10-04 DIAGNOSIS — M62838 Other muscle spasm: Secondary | ICD-10-CM

## 2017-10-04 DIAGNOSIS — M25552 Pain in left hip: Secondary | ICD-10-CM

## 2017-10-04 NOTE — Therapy (Signed)
[facility] [address], [place], [place], [address] Phone: [phone]   Fax:  [phone]  Physical Therapy Treatment  Patient Details  Name: Alyssa Fisher MRN: [id number] Date of Birth: [birth year] Referring Provider: Rachell Cipro, MD    Encounter Date: 10/04/2017  PT End of Session - 10/04/17 1115    Visit Number  4    Number of Visits  16    Date for PT Re-Evaluation  11/14/17    Authorization Time Period  09/19/17 to 11/14/17    PT Start Time  1012    PT Stop Time  1054    PT Time Calculation (min)  42 min    Activity Tolerance  Patient tolerated treatment well;No increased pain    Behavior During Therapy  WFL for tasks assessed/performed       Past Medical History:  Diagnosis Date  . Anxiety   . Broken ankle   . Dysphagia 10-23-12   due to thyroid issues  . GERD (gastroesophageal reflux disease)   . History of tonsillectomy   . Hypothyroidism   . Miscarriage    One - date not on medical form.  . Mitral valve prolapse 10/23/2012  . 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 Rosacea   . Seasonal allergies    uses albuterol inhaler as needed for allergies  . SVD (spontaneous vaginal delivery)    x 5  . Thyroid goiter 10-23-12   multinodular- has been followed x 74yr    Past Surgical History:  Procedure Laterality Date  . ANTERIOR AND POSTERIOR REPAIR N/A 02/21/2017   Procedure: ANTERIOR (CYSTOCELE) AND POSTERIOR REPAIR (RECTOCELE);  Surgeon: MCheri Fowler MD;  [facility];  Service: Gynecology;  Laterality: N/A;  . BILATERAL SALPINGECTOMY N/A 02/21/2017   Procedure: BILATERAL SALPINGECTOMY;  Surgeon: MCheri Fowler MD;  [facility];  Service: Gynecology;  Laterality: N/A;  . BLADDER SUSPENSION N/A 02/21/2017   Procedure: Solyx Sling;  Surgeon: MCheri Fowler MD;  [facility];  Service: Gynecology;  Laterality: N/A;  . broken ankle Right    Pins/rod  inserted-retained  . COLONOSCOPY    . DILATION AND CURETTAGE OF UTERUS     MAB  . THYROIDECTOMY N/A 10/29/2012   Procedure: THYROIDECTOMY;  Surgeon: TEarnstine Regal MD;  [facility];  Service: General;  Laterality: N/A;  Total Thyroidectomy  . TONSILLECTOMY    . VAGINAL HYSTERECTOMY Bilateral 02/21/2017   Procedure: HYSTERECTOMY VAGINAL;  Surgeon: MCheri Fowler MD;  [facility];  Service: Gynecology;  Laterality: Bilateral;  . WISDOM TOOTH EXTRACTION      There were no vitals filed for this visit.  Subjective Assessment - 10/04/17 1017    Subjective  Pt reports that she is doing well. She does have the pain wrapping around her Lt groin that she will notice when she gets up in the mornings. She has not had nearly as intense pain in her low back following her last session.     Pertinent History  Legally blind (very narrow vision)    Limitations  Other (comment) going up/down stairs    Currently in Pain?  Yes    Pain Score  6     Pain Location  Groin    Pain Orientation  Left    Pain Descriptors / Indicators  Aching    Pain Type  Chronic pain    Pain Radiating Towards  none     Pain Onset  More than a month ago    Pain Frequency  Intermittent    Aggravating Factors   usually just comes out of no where    Pain Relieving Factors  sometimes moving the leg will help     Effect of Pain on Daily Activities  moderate                       [facility] Adult PT Treatment/Exercise - 10/04/17 0001      Lumbar Exercises: Seated   Other Seated Lumbar Exercises  seated Lt nerve flossing x15 reps       Lumbar Exercises: Supine   Large Ball Oblique Isometric  15 reps;3 seconds;Limitations    Large Ball Oblique Isometric Limitations  UE/LE press without ball     Other Supine Lumbar Exercises  bent knee fall out x15 reps each; Lt hip abduction/adduction slide x15 reps       Lumbar Exercises: Sidelying   Clam  Both;5 reps;Limitations    Clam Limitations  blue TB     Other  Sidelying Lumbar Exercises  thoracic opening rotation Lt/Rt x15 reps each       Manual Therapy   Soft tissue mobilization  soft tissue mobilization Lt glute med/TFL, Lt glute max              PT Education - 10/04/17 1115    Education provided  Yes    Education Details  additions to HEP and technique review     Person(s) Educated  Patient    Methods  Explanation;Verbal cues;Handout    Comprehension  Verbalized understanding;Returned demonstration       PT Short Term Goals - 10/02/17 1110      PT SHORT TERM GOAL #1   Title  Pt will demo consistency and independence with her HEP to decrease pain and improve LE strength.    Time  4    Period  Weeks    Status  Achieved      PT SHORT TERM GOAL #2   Title  Pt will demo proper placement and use of lumbar roll while seated, to improve posture and pain with sittitng throughout the day.     Time  4    Period  Weeks    Status  Partially Met      PT SHORT TERM GOAL #3   Title  Pt will report atleast 25% improvement in her pain/symptoms from the start of therapy, to allow her to complete daily activity.     Time  4    Period  Weeks    Status  On-going        PT Long Term Goals - 09/19/17 1126      PT LONG TERM GOAL #1   Title  Pt will demo improved BLE strength to atleast 4/5 MMT to improve her efficiency and safety with daily activity.     Time  8    Period  Weeks    Status  New    Target Date  11/14/17      PT LONG TERM GOAL #2   Title  Pt will be able to get on/off the floor with no more than therapist cuing for hand/foot placement, 2/3 trials, which will increase her ability to interract with her grandchildren.     Time  8    Period  Weeks    Status  New      PT LONG TERM GOAL #3   Title  Pt  will report atleast 50% reduction in her pain/symptoms from the start of therapy, to increase her quality of life and allow her to complete ADLs without need for assistance.     Time  8    Period  Weeks    Status  New       PT LONG TERM GOAL #4   Title  Pt will be able to ascend/descend 1 flight of stairs with no more than 2/10 pain report, to allow her to get around her house without difficulty.    Time  8    Period  Weeks    Status  New      PT LONG TERM GOAL #5   Title  Pt will demo improved hip strength and stability evident by her ability to complete 5x sit to stand in less than 13 sec (without UE support) and without knee valgus deviation.     Time  8    Period  Weeks    Status  New            Plan - 10/04/17 1116    Clinical Impression Statement  Pt is making progress towards goals, reporting atleast 25% reduction in pain in the low back since the start of therapy. Pt arrived primarily with Lt groin/buttock pain this session which improved some following soft tissue mobilization. Pt was able to progress resistance therex with clams this session, with additional cuing needed for trunk stabilization. Pt demonstrated good understanding of HEP additions made this session. Will continue with current POC progressing LE strength, flexibility and decreasing pain with activity.     Rehab Potential  Good    PT Frequency  2x / week    PT Duration  8 weeks    PT Treatment/Interventions  ADLs/Self Care Home Management;Cryotherapy;Electrical Stimulation;Moist Heat;Traction;Iontophoresis 14m/ml Dexamethasone;Gait training;Neuromuscular re-education;Stair training;Functional mobility training;Therapeutic activities;Therapeutic exercise;Patient/family education;Manual techniques;Taping;Dry needling;Passive range of motion    PT Next Visit Plan  f/u on prone pressups on hands); lumbar extension based exercises and therapist overpressure; hip strengthening (standing) and soft tissue techniques as needed to decrease pain; discuss possible dry needling to hip/buttock area    PT Home Exercise Plan  prone press up on elbows; side clams with green TB, LE nerve flossing, lumbar roll, ab brace with bent knee lower    Consulted  and Agree with Plan of Care  Patient       Patient will benefit from skilled therapeutic intervention in order to improve the following deficits and impairments:  Abnormal gait, Decreased activity tolerance, Difficulty walking, Impaired flexibility, Decreased strength, Decreased range of motion, Increased muscle spasms, Postural dysfunction, Pain, Decreased mobility, Impaired sensation  Visit Diagnosis: Chronic left-sided low back pain, with sciatica presence unspecified  Pain in left hip  Muscle weakness (generalized)  Other muscle spasm     Problem List Patient Active Problem List   Diagnosis Date Noted  . Pelvic relaxation 02/21/2017  . 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 Dysphagia 03/01/2011  . Night sweats 02/20/2011  . Fatigue/Loss of Sleep 02/20/2011  . Mitral valve prolapse 02/20/2011  . FHx: allergies 02/20/2011  . Multiple thyroid nodules 02/20/2011  . Rash/bruises easily 02/20/2011  . Dizziness - light-headed/fainting 02/20/2011  . Generalized headaches 02/20/2011  . Retinitis pigmentosa 02/20/2011  . Sinus problem 02/20/2011  . Runny nose 02/20/2011  . Hoarseness/sore throat 02/20/2011  . Wears glasses/contacts 02/20/2011  . Arthritis 02/20/2011  . Family history of breast cancer 02/20/2011    11:47 AM,10/04/17 SSherol DadePT, DPT [facility] [address] [place], [place], [address] Phone: [phone]   Fax:  [phone]  Name: CAMERYN SCHUM MRN: [id number] Date of Birth: 1966/04/02

## 2017-10-04 NOTE — Patient Instructions (Signed)
   Pelvic Tilt with March (II)  Lie on back and lift both feet into the air, keeping a 90 degree bend at both the hips and the knees. Tighten lower stomach muscles. Slowly lower one foot to the mat, keeping the knee bent. Lift the leg back to the starting position. Alternate between left and rights legs. x10 reps each   [facility] [address], [place], [place] [address] Phone # [phone] Fax [phone]

## 2017-10-09 ENCOUNTER — Ambulatory Visit: Payer: PPO | Attending: Family Medicine | Admitting: Physical Therapy

## 2017-10-09 ENCOUNTER — Encounter: Payer: Self-pay | Admitting: Physical Therapy

## 2017-10-09 DIAGNOSIS — M25552 Pain in left hip: Secondary | ICD-10-CM | POA: Diagnosis not present

## 2017-10-09 DIAGNOSIS — M6281 Muscle weakness (generalized): Secondary | ICD-10-CM

## 2017-10-09 DIAGNOSIS — M62838 Other muscle spasm: Secondary | ICD-10-CM

## 2017-10-09 DIAGNOSIS — M545 Low back pain: Secondary | ICD-10-CM | POA: Diagnosis not present

## 2017-10-09 DIAGNOSIS — G8929 Other chronic pain: Secondary | ICD-10-CM | POA: Diagnosis not present

## 2017-10-09 NOTE — Therapy (Signed)
[facility] [address], [place], [place], [address] Phone: [phone]   Fax:  [phone]  Physical Therapy Treatment  Patient Details  Name: Alyssa Fisher MRN: [id number] Date of Birth: 08-29-1966 Referring Provider: Rachell Cipro, MD    Encounter Date: 10/09/2017  PT End of Session - 10/09/17 0900    Visit Number  5    Number of Visits  16    Date for PT Re-Evaluation  11/14/17    Authorization Time Period  09/19/17 to 11/14/17    PT Start Time  0853    PT Stop Time  0948    PT Time Calculation (min)  55 min    Activity Tolerance  Patient tolerated treatment well;No increased pain    Behavior During Therapy  WFL for tasks assessed/performed       Past Medical History:  Diagnosis Date  . Anxiety   . Broken ankle   . Dysphagia 10-23-12   due to thyroid issues  . GERD (gastroesophageal reflux disease)   . History of tonsillectomy   . Hypothyroidism   . Miscarriage    One - date not on medical form.  . Mitral valve prolapse 10/23/2012  . 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 Rosacea   . Seasonal allergies    uses albuterol inhaler as needed for allergies  . SVD (spontaneous vaginal delivery)    x 5  . Thyroid goiter 10-23-12   multinodular- has been followed x 82yr    Past Surgical History:  Procedure Laterality Date  . ANTERIOR AND POSTERIOR REPAIR N/A 02/21/2017   Procedure: ANTERIOR (CYSTOCELE) AND POSTERIOR REPAIR (RECTOCELE);  Surgeon: MCheri Fowler MD;  [facility];  Service: Gynecology;  Laterality: N/A;  . BILATERAL SALPINGECTOMY N/A 02/21/2017   Procedure: BILATERAL SALPINGECTOMY;  Surgeon: MCheri Fowler MD;  [facility];  Service: Gynecology;  Laterality: N/A;  . BLADDER SUSPENSION N/A 02/21/2017   Procedure: Solyx Sling;  Surgeon: MCheri Fowler MD;  [facility];  Service: Gynecology;  Laterality: N/A;  . broken ankle Right    Pins/rod  inserted-retained  . COLONOSCOPY    . DILATION AND CURETTAGE OF UTERUS     MAB  . THYROIDECTOMY N/A 10/29/2012   Procedure: THYROIDECTOMY;  Surgeon: TEarnstine Regal MD;  [facility];  Service: General;  Laterality: N/A;  Total Thyroidectomy  . TONSILLECTOMY    . VAGINAL HYSTERECTOMY Bilateral 02/21/2017   Procedure: HYSTERECTOMY VAGINAL;  Surgeon: MCheri Fowler MD;  [facility];  Service: Gynecology;  Laterality: Bilateral;  . WISDOM TOOTH EXTRACTION      There were no vitals filed for this visit.  Subjective Assessment - 10/09/17 0857    Subjective  Pt reports that she was sore for a day or so following her last session, however she also moved alot of furniture following the session. She feels that the bridge helps alot with relieving her pain in the groin area.     Pertinent History  Legally blind (very narrow vision)    Limitations  Other (comment) going up/down stairs    Currently in Pain?  No/denies    Pain Onset  More than a month ago                      [facility] 10/09[facility]17 0001      Lumbar Exercises: Stretches   Hip Flexor Stretch  2 reps;Left;Right;30 seconds;Limitations  Hip Flexor Stretch Limitations  supine thomas position       Lumbar Exercises: Standing   Other Standing Lumbar Exercises  hip extension (knee straight) 2x15 reps each with forearms resting on countertop    Other Standing Lumbar Exercises  sidestepping along countertop (2f) x3 round down/back       Lumbar Exercises: Supine   Straight Leg Raise  Limitations;15 reps    Straight Leg Raises Limitations  each LE with abdominal brace     Other Supine Lumbar Exercises  bent knee IR/ER active with therapist holding LEs x20 reps       Lumbar Exercises: Sidelying   Hip Abduction  10 reps;Both      Lumbar Exercises: Prone   Straight Leg Raise  15 reps    Other Prone Lumbar Exercises  trunk lifts x2 sec hold 3x5 reps       Manual Therapy   Manual therapy  comments  educated pt on self iliacus TPR technique for home     Soft tissue mobilization  soft tissue mobilization Lt glute med/TFL, Lt glute max, Lt vastus lateralis              PT Education - 10/09/17 1019    Education provided  Yes    Education Details  expectations of soreness following sessions for 1-2 days and benefits of continued HEP adherence to decrease soreness; dry needling implications     Person(s) Educated  Patient    Methods  Explanation    Comprehension  Verbalized understanding       PT Short Term Goals - 10/02/17 1110      PT SHORT TERM GOAL #1   Title  Pt will demo consistency and independence with her HEP to decrease pain and improve LE strength.    Time  4    Period  Weeks    Status  Achieved      PT SHORT TERM GOAL #2   Title  Pt will demo proper placement and use of lumbar roll while seated, to improve posture and pain with sittitng throughout the day.     Time  4    Period  Weeks    Status  Partially Met      PT SHORT TERM GOAL #3   Title  Pt will report atleast 25% improvement in her pain/symptoms from the start of therapy, to allow her to complete daily activity.     Time  4    Period  Weeks    Status  On-going        PT Long Term Goals - 09/19/17 1126      PT LONG TERM GOAL #1   Title  Pt will demo improved BLE strength to atleast 4/5 MMT to improve her efficiency and safety with daily activity.     Time  8    Period  Weeks    Status  New    Target Date  11/14/17      PT LONG TERM GOAL #2   Title  Pt will be able to get on/off the floor with no more than therapist cuing for hand/foot placement, 2/3 trials, which will increase her ability to interract with her grandchildren.     Time  8    Period  Weeks    Status  New      PT LONG TERM GOAL #3   Title  Pt will report atleast 50% reduction in her pain/symptoms from the start of therapy, to increase  her quality of life and allow her to complete ADLs without need for assistance.      Time  8    Period  Weeks    Status  New      PT LONG TERM GOAL #4   Title  Pt will be able to ascend/descend 1 flight of stairs with no more than 2/10 pain report, to allow her to get around her house without difficulty.    Time  8    Period  Weeks    Status  New      PT LONG TERM GOAL #5   Title  Pt will demo improved hip strength and stability evident by her ability to complete 5x sit to stand in less than 13 sec (without UE support) and without knee valgus deviation.     Time  8    Period  Weeks    Status  New            Plan - 10/09/17 1022    Clinical Impression Statement  Pt continues to report improved pain, noting no pain upon arrival and only needing to take half of her typical dose of ibuprofen lately. Session continued with focus on therex to improve hip mobility/strength. Pt does continue to have restrictions along the lateral thigh/gluteal region which is improving with soft tissue mobilization during her session and self-mobilization techniques at home. Ended session without any reports of pain and pt verbalized good understanding of minor adjustments made to her HEP. Will continue with current POC.     Rehab Potential  Good    PT Frequency  2x / week    PT Duration  8 weeks    PT Treatment/Interventions  ADLs/Self Care Home Management;Cryotherapy;Electrical Stimulation;Moist Heat;Traction;Iontophoresis 94m/ml Dexamethasone;Gait training;Neuromuscular re-education;Stair training;Functional mobility training;Therapeutic activities;Therapeutic exercise;Patient/family education;Manual techniques;Taping;Dry needling;Passive range of motion    PT Next Visit Plan  trial prone pressups with over pressure if needed; prone strengthening exercise; soft tissue techniques as needed to decrease pain; possible dry needling to hip/buttock area    PT Home Exercise Plan  prone press up on elbows; side clams with green TB, LE nerve flossing, lumbar roll, ab brace with bent knee lower     Consulted and Agree with Plan of Care  Patient       Patient will benefit from skilled therapeutic intervention in order to improve the following deficits and impairments:  Abnormal gait, Decreased activity tolerance, Difficulty walking, Impaired flexibility, Decreased strength, Decreased range of motion, Increased muscle spasms, Postural dysfunction, Pain, Decreased mobility, Impaired sensation  Visit Diagnosis: Chronic left-sided low back pain, with sciatica presence unspecified  Pain in left hip  Muscle weakness (generalized)  Other muscle spasm     Problem List Patient Active Problem List   Diagnosis Date Noted  . Pelvic relaxation 02/21/2017  . 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 Dysphagia 03/01/2011  . Night sweats 02/20/2011  . Fatigue/Loss of Sleep 02/20/2011  . Mitral valve prolapse 02/20/2011  . FHx: allergies 02/20/2011  . Multiple thyroid nodules 02/20/2011  . Rash/bruises easily 02/20/2011  . Dizziness - light-headed/fainting 02/20/2011  . Generalized headaches 02/20/2011  . Retinitis pigmentosa 02/20/2011  . Sinus problem 02/20/2011  . Runny nose 02/20/2011  . Hoarseness/sore throat 02/20/2011  . Wears glasses/contacts 02/20/2011  . Arthritis 02/20/2011  . Family history of breast cancer 02/20/2011    10:30 AM,10/09/17 SSherol DadePT, DPT [facility] [address]  [address], [place], [place], [address] Phone: [phone]   Fax:  [phone]  Name: Alyssa Fisher MRN: [id number] Date of Birth: [birth year]

## 2017-10-11 DIAGNOSIS — J301 Allergic rhinitis due to pollen: Secondary | ICD-10-CM | POA: Diagnosis not present

## 2017-10-12 ENCOUNTER — Ambulatory Visit: Payer: PPO | Admitting: Physical Therapy

## 2017-10-12 ENCOUNTER — Encounter: Payer: Self-pay | Admitting: Physical Therapy

## 2017-10-12 DIAGNOSIS — M545 Low back pain: Principal | ICD-10-CM

## 2017-10-12 DIAGNOSIS — M62838 Other muscle spasm: Secondary | ICD-10-CM

## 2017-10-12 DIAGNOSIS — M6281 Muscle weakness (generalized): Secondary | ICD-10-CM

## 2017-10-12 DIAGNOSIS — G8929 Other chronic pain: Secondary | ICD-10-CM

## 2017-10-12 DIAGNOSIS — M25552 Pain in left hip: Secondary | ICD-10-CM

## 2017-10-12 NOTE — Therapy (Signed)
[facility] [address], [place], [place], [address] Phone: [phone]   Fax:  [phone]  Physical Therapy Treatment  Patient Details  Name: Alyssa Fisher MRN: [id number] Date of Birth: October 10, 1965 Referring Provider: Rachell Cipro, MD    Encounter Date: 10/12/2017  PT End of Session - 10/12/17 1030    Visit Number  6    Number of Visits  16    Date for PT Re-Evaluation  11/14/17    Authorization Time Period  09/19/17 to 11/14/17    PT Start Time  0932    PT Stop Time  4944    PT Time Calculation (min)  42 min    Activity Tolerance  Patient tolerated treatment well;No increased pain    Behavior During Therapy  WFL for tasks assessed/performed       Past Medical History:  Diagnosis Date  . Anxiety   . Broken ankle   . Dysphagia 10-23-12   due to thyroid issues  . GERD (gastroesophageal reflux disease)   . History of tonsillectomy   . Hypothyroidism   . Miscarriage    One - date not on medical form.  . Mitral valve prolapse 10/23/2012  . 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 Rosacea   . Seasonal allergies    uses albuterol inhaler as needed for allergies  . SVD (spontaneous vaginal delivery)    x 5  . Thyroid goiter 10-23-12   multinodular- has been followed x 108yr    Past Surgical History:  Procedure Laterality Date  . ANTERIOR AND POSTERIOR REPAIR N/A 02/21/2017   Procedure: ANTERIOR (CYSTOCELE) AND POSTERIOR REPAIR (RECTOCELE);  Surgeon: MCheri Fowler MD;  [facility];  Service: Gynecology;  Laterality: N/A;  . BILATERAL SALPINGECTOMY N/A 02/21/2017   Procedure: BILATERAL SALPINGECTOMY;  Surgeon: MCheri Fowler MD;  [facility];  Service: Gynecology;  Laterality: N/A;  . BLADDER SUSPENSION N/A 02/21/2017   Procedure: Solyx Sling;  Surgeon: MCheri Fowler MD;  [facility];  Service: Gynecology;  Laterality: N/A;  . broken ankle Right    Pins/rod  inserted-retained  . COLONOSCOPY    . DILATION AND CURETTAGE OF UTERUS     MAB  . THYROIDECTOMY N/A 10/29/2012   Procedure: THYROIDECTOMY;  Surgeon: TEarnstine Regal MD;  [facility];  Service: General;  Laterality: N/A;  Total Thyroidectomy  . TONSILLECTOMY    . VAGINAL HYSTERECTOMY Bilateral 02/21/2017   Procedure: HYSTERECTOMY VAGINAL;  Surgeon: MCheri Fowler MD;  [facility];  Service: Gynecology;  Laterality: Bilateral;  . WISDOM TOOTH EXTRACTION      There were no vitals filed for this visit.  Subjective Assessment - 10/12/17 1039    Subjective  Pt reports that things are going well. She was doing great up until last night when she had a cramp in her quadriceps. She was able to resolve this mostly with completing some of her HEP.     Pertinent History  Legally blind (very narrow vision)    Limitations  Other (comment) going up/down stairs    Currently in Pain?  No/denies    Pain Onset  More than a month ago                      [facility] - 10[facility]17 0001      Lumbar Exercises: Stretches   Figure 4 Stretch  3 reps;30 seconds;Without overpressure    Figure  4 Stretch Limitations  supine, Lt only      Lumbar Exercises: Sidelying   Other Sidelying Lumbar Exercises  Lt quadriceps stretch x30 sec, HEP demo       Lumbar Exercises: Prone   Straight Leg Raise  15 reps;Limitations    Straight Leg Raises Limitations  x2 sets each       Lumbar Exercises: Quadruped   Madcat/Old Horse  10 reps;Limitations    Madcat/Old Horse Limitations  increased thoracic mobility, cuing needed to improve lumbar mobility/activation    Single Arm Raise  10 reps;Left;Right;Limitations    Single Arm Raises Limitations  tactile cues required       Manual Therapy   Manual therapy comments  discussed using rolling pin to address quadriceps soreness    Soft tissue mobilization  TPR Lt rectus femoris, STM Lt quadriceps             PT Education -  10/12/17 1025    Education provided  Yes    Education Details  techniques and stretches for home to address muscle cramps; increasing water intake     Person(s) Educated  Patient    Methods  Explanation;Handout    Comprehension  Verbalized understanding;Returned demonstration       PT Short Term Goals - 10/02/17 1110      PT SHORT TERM GOAL #1   Title  Pt will demo consistency and independence with her HEP to decrease pain and improve LE strength.    Time  4    Period  Weeks    Status  Achieved      PT SHORT TERM GOAL #2   Title  Pt will demo proper placement and use of lumbar roll while seated, to improve posture and pain with sittitng throughout the day.     Time  4    Period  Weeks    Status  Partially Met      PT SHORT TERM GOAL #3   Title  Pt will report atleast 25% improvement in her pain/symptoms from the start of therapy, to allow her to complete daily activity.     Time  4    Period  Weeks    Status  On-going        PT Long Term Goals - 09/19/17 1126      PT LONG TERM GOAL #1   Title  Pt will demo improved BLE strength to atleast 4/5 MMT to improve her efficiency and safety with daily activity.     Time  8    Period  Weeks    Status  New    Target Date  11/14/17      PT LONG TERM GOAL #2   Title  Pt will be able to get on/off the floor with no more than therapist cuing for hand/foot placement, 2/3 trials, which will increase her ability to interract with her grandchildren.     Time  8    Period  Weeks    Status  New      PT LONG TERM GOAL #3   Title  Pt will report atleast 50% reduction in her pain/symptoms from the start of therapy, to increase her quality of life and allow her to complete ADLs without need for assistance.     Time  8    Period  Weeks    Status  New      PT LONG TERM GOAL #4   Title  Pt will be able to ascend/descend  1 flight of stairs with no more than 2/10 pain report, to allow her to get around her house without difficulty.    Time   8    Period  Weeks    Status  New      PT LONG TERM GOAL #5   Title  Pt will demo improved hip strength and stability evident by her ability to complete 5x sit to stand in less than 13 sec (without UE support) and without knee valgus deviation.     Time  8    Period  Weeks    Status  New            Plan - 10/12/17 1030    Clinical Impression Statement  Pt arrived noting increased quadriceps spasm through the night which she was able to improve with some of her HEP. Therapist completed soft tissue mobilization to the area and reviewed stretches for the pt to utilize in the future to further improve pain/discomfort. She demonstrated good understanding of this. Overall, pt is having more "good days" and finds good relief with HEP completion. She also notes minimal to no back pain recently which is an improvement. Will continue with current POC.     Rehab Potential  Good    PT Frequency  2x / week    PT Duration  8 weeks    PT Treatment/Interventions  ADLs/Self Care Home Management;Cryotherapy;Electrical Stimulation;Moist Heat;Traction;Iontophoresis 27m/ml Dexamethasone;Gait training;Neuromuscular re-education;Stair training;Functional mobility training;Therapeutic activities;Therapeutic exercise;Patient/family education;Manual techniques;Taping;Dry needling;Passive range of motion    PT Next Visit Plan  f/u on quad stretch/cramps; prone strengthening exercise; soft tissue techniques as needed to decrease pain; progress hip strengthening; possible dry needling to hip/buttock area    PT Home Exercise Plan  prone press up on elbows; side clams with green TB, LE nerve flossing, lumbar roll, ab brace with bent knee lower; sidelying quadriceps    Consulted and Agree with Plan of Care  Patient       Patient will benefit from skilled therapeutic intervention in order to improve the following deficits and impairments:  Abnormal gait, Decreased activity tolerance, Difficulty walking, Impaired  flexibility, Decreased strength, Decreased range of motion, Increased muscle spasms, Postural dysfunction, Pain, Decreased mobility, Impaired sensation  Visit Diagnosis: Chronic left-sided low back pain, with sciatica presence unspecified  Pain in left hip  Muscle weakness (generalized)  Other muscle spasm     Problem List Patient Active Problem List   Diagnosis Date Noted  . Pelvic relaxation 02/21/2017  . 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 Dysphagia 03/01/2011  . Night sweats 02/20/2011  . Fatigue/Loss of Sleep 02/20/2011  . Mitral valve prolapse 02/20/2011  . FHx: allergies 02/20/2011  . Multiple thyroid nodules 02/20/2011  . Rash/bruises easily 02/20/2011  . Dizziness - light-headed/fainting 02/20/2011  . Generalized headaches 02/20/2011  . Retinitis pigmentosa 02/20/2011  . Sinus problem 02/20/2011  . Runny nose 02/20/2011  . Hoarseness/sore throat 02/20/2011  . Wears glasses/contacts 02/20/2011  . Arthritis 02/20/2011  . Family history of breast cancer 02/20/2011   10:43 AM,10/12/17 SSherol DadePT, DPT [facility] [address] [place] [address]Phone: [phone]  Fax:  [phone] Name: Alyssa WACHTELMRN: [id number] of Birth: 305/16/1967

## 2017-10-12 NOTE — Patient Instructions (Signed)
   QUADRICEPS STRETCH - SIDELYING  While lying on your side reach back and hold your top foot and bend your knee until a stretch is felt.  Hold 15-20 sec.    [facility] [address] [place], [place] [address] Phone # [phone] Fax [phone]

## 2017-10-15 ENCOUNTER — Ambulatory Visit: Payer: PPO | Admitting: Physical Therapy

## 2017-10-16 ENCOUNTER — Encounter: Payer: PPO | Admitting: Physical Therapy

## 2017-10-19 ENCOUNTER — Encounter: Payer: PPO | Admitting: Physical Therapy

## 2017-10-23 ENCOUNTER — Encounter: Payer: Self-pay | Admitting: Physical Therapy

## 2017-10-23 ENCOUNTER — Ambulatory Visit: Payer: PPO | Admitting: Physical Therapy

## 2017-10-23 DIAGNOSIS — Z6828 Body mass index (BMI) 28.0-28.9, adult: Secondary | ICD-10-CM | POA: Diagnosis not present

## 2017-10-23 DIAGNOSIS — M545 Low back pain: Principal | ICD-10-CM

## 2017-10-23 DIAGNOSIS — R51 Headache: Secondary | ICD-10-CM | POA: Diagnosis not present

## 2017-10-23 DIAGNOSIS — M25552 Pain in left hip: Secondary | ICD-10-CM | POA: Diagnosis not present

## 2017-10-23 DIAGNOSIS — I10 Essential (primary) hypertension: Secondary | ICD-10-CM | POA: Diagnosis not present

## 2017-10-23 DIAGNOSIS — G8929 Other chronic pain: Secondary | ICD-10-CM

## 2017-10-23 DIAGNOSIS — M6281 Muscle weakness (generalized): Secondary | ICD-10-CM

## 2017-10-23 DIAGNOSIS — M62838 Other muscle spasm: Secondary | ICD-10-CM

## 2017-10-23 NOTE — Therapy (Signed)
[facility] [address], [place], [place], [address] Phone: [phone]   Fax:  [phone]  Physical Therapy Treatment  Patient Details  Name: Alyssa Fisher MRN: [id number] Date of Birth: 1966/01/18 Referring Provider: Rachell Cipro, MD    Encounter Date: 10/23/2017  PT End of Session - 10/23/17 1058    Visit Number  7    Number of Visits  16    Date for PT Re-Evaluation  11/14/17    Authorization Time Period  09/19/17 to 11/14/17    PT Start Time  1013    PT Stop Time  1103    PT Time Calculation (min)  50 min    Activity Tolerance  Patient tolerated treatment well;No increased pain    Behavior During Therapy  WFL for tasks assessed/performed       Past Medical History:  Diagnosis Date  . Anxiety   . Broken ankle   . Dysphagia 10-23-12   due to thyroid issues  . GERD (gastroesophageal reflux disease)   . History of tonsillectomy   . Hypothyroidism   . Miscarriage    One - date not on medical form.  . Mitral valve prolapse 10/23/2012  . 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 Rosacea   . Seasonal allergies    uses albuterol inhaler as needed for allergies  . SVD (spontaneous vaginal delivery)    x 5  . Thyroid goiter 10-23-12   multinodular- has been followed x 34yr    Past Surgical History:  Procedure Laterality Date  . ANTERIOR AND POSTERIOR REPAIR N/A 02/21/2017   Procedure: ANTERIOR (CYSTOCELE) AND POSTERIOR REPAIR (RECTOCELE);  Surgeon: MCheri Fowler MD;  [facility];  Service: Gynecology;  Laterality: N/A;  . BILATERAL SALPINGECTOMY N/A 02/21/2017   Procedure: BILATERAL SALPINGECTOMY;  Surgeon: MCheri Fowler MD;  [facility];  Service: Gynecology;  Laterality: N/A;  . BLADDER SUSPENSION N/A 02/21/2017   Procedure: Solyx Sling;  Surgeon: MCheri Fowler MD;  [facility];  Service: Gynecology;  Laterality: N/A;  . broken ankle Right    Pins/rod  inserted-retained  . COLONOSCOPY    . DILATION AND CURETTAGE OF UTERUS     MAB  . THYROIDECTOMY N/A 10/29/2012   Procedure: THYROIDECTOMY;  Surgeon: TEarnstine Regal MD;  [facility];  Service: General;  Laterality: N/A;  Total Thyroidectomy  . TONSILLECTOMY    . VAGINAL HYSTERECTOMY Bilateral 02/21/2017   Procedure: HYSTERECTOMY VAGINAL;  Surgeon: MCheri Fowler MD;  [facility];  Service: Gynecology;  Laterality: Bilateral;  . WISDOM TOOTH EXTRACTION      There were no vitals filed for this visit.  Subjective Assessment - 10/23/17 1013    Subjective  Pt reports that everyone was getting the flu at her house, but things are much better now. Because of all of this she hasn't been able to keep up with her exercises. She has noticed increase in her Lt hip pain because she has not been working on her exercises.     Pertinent History  Legally blind (very narrow vision)    Limitations  Other (comment) going up/down stairs    Currently in Pain?  Yes    Pain Score  7     Pain Location  Hip    Pain Orientation  Left;Lateral    Pain Descriptors / Indicators  Aching    Pain Type  Chronic pain    Pain Radiating Towards  none  Pain Onset  More than a month ago    Pain Frequency  Constant    Aggravating Factors   unsure     Pain Relieving Factors  HEP helps alot                       [facility] Adult PT Treatment/Exercise - 10/23/17 0001      Lumbar Exercises: Standing   Other Standing Lumbar Exercises  Lt/Rt hip extension x10 reps without weight, 2nd set with 2# ankle weights (forearms resting on countertop)       Lumbar Exercises: Supine   Bent Knee Raise  15 reps;Limitations    Bent Knee Raise Limitations  therapist cuing to improve abdominal activation    Dead Bug  5 reps;Limitations    Dead Bug Limitations  x2 sets    Other Supine Lumbar Exercises  B knees to chest (propped on red physioball) x15 reps    Other Supine Lumbar Exercises  low trunk rotation with  therapist assistance x10 reps Lt/Rt and LE propped on red physioball       Modalities   Modalities  Cryotherapy      Cryotherapy   Number Minutes Cryotherapy  8 Minutes    Cryotherapy Location  Hip;Other (comment) Lt lateral    Type of Cryotherapy  Ice pack      Manual Therapy   Manual Therapy  Passive ROM    Soft tissue mobilization  IASTM Lt quadriceps/ITB/hamstring; STM Lt gluteals, TPR Lt piriformis     Passive ROM  Lt hip adductor stretch x5 reps              PT Education - 10/23/17 1101    Education provided  Yes    Education Details  benefits of consistent HEP adherence but encouraged pt to pick back up with these rather than fearing a major set back in her progress; technique with therex; ice parameters for the hip     Person(s) Educated  Patient    Methods  Explanation;Verbal cues;Tactile cues    Comprehension  Verbalized understanding;Returned demonstration       PT Short Term Goals - 10/02/17 1110      PT SHORT TERM GOAL #1   Title  Pt will demo consistency and independence with her HEP to decrease pain and improve LE strength.    Time  4    Period  Weeks    Status  Achieved      PT SHORT TERM GOAL #2   Title  Pt will demo proper placement and use of lumbar roll while seated, to improve posture and pain with sittitng throughout the day.     Time  4    Period  Weeks    Status  Partially Met      PT SHORT TERM GOAL #3   Title  Pt will report atleast 25% improvement in her pain/symptoms from the start of therapy, to allow her to complete daily activity.     Time  4    Period  Weeks    Status  On-going        PT Long Term Goals - 09/19/17 1126      PT LONG TERM GOAL #1   Title  Pt will demo improved BLE strength to atleast 4/5 MMT to improve her efficiency and safety with daily activity.     Time  8    Period  Weeks    Status  New  Target Date  11/14/17      PT LONG TERM GOAL #2   Title  Pt will be able to get on/off the floor with no more  than therapist cuing for hand/foot placement, 2/3 trials, which will increase her ability to interract with her grandchildren.     Time  8    Period  Weeks    Status  New      PT LONG TERM GOAL #3   Title  Pt will report atleast 50% reduction in her pain/symptoms from the start of therapy, to increase her quality of life and allow her to complete ADLs without need for assistance.     Time  8    Period  Weeks    Status  New      PT LONG TERM GOAL #4   Title  Pt will be able to ascend/descend 1 flight of stairs with no more than 2/10 pain report, to allow her to get around her house without difficulty.    Time  8    Period  Weeks    Status  New      PT LONG TERM GOAL #5   Title  Pt will demo improved hip strength and stability evident by her ability to complete 5x sit to stand in less than 13 sec (without UE support) and without knee valgus deviation.     Time  8    Period  Weeks    Status  New            Plan - 10/23/17 1058    Clinical Impression Statement  Pt was unable to keep up with her exercises this past week due to sickness in her family. Lt hip pain was 7/10 at arrival to her session. Continued with lumbar and hip flexibility therex in addition to core stabilization, pt requiring increased cues to properly activate and maintain deep abdominal contraction with several exercises. Therapist completed manual techniques to the Lt hip region, noting several trigger points and muscle spasm to the gluteals and lateral thigh. Will consider dry needling next session to further address this. Pt reported overall decrease in her Lt hip pain end of session.     Rehab Potential  Good    PT Frequency  2x / week    PT Duration  8 weeks    PT Treatment/Interventions  ADLs/Self Care Home Management;Cryotherapy;Electrical Stimulation;Moist Heat;Traction;Iontophoresis 67m/ml Dexamethasone;Gait training;Neuromuscular re-education;Stair training;Functional mobility training;Therapeutic  activities;Therapeutic exercise;Patient/family education;Manual techniques;Taping;Dry needling;Passive range of motion    PT Next Visit Plan  DN Lt gluteals/lumbar multifidi; prone strengthening exercise; soft tissue techniques as needed to decrease pain; progress hip strengthening    PT Home Exercise Plan  prone press up on elbows; side clams with green TB, LE nerve flossing, lumbar roll, ab brace with bent knee lower; sidelying quadriceps    Consulted and Agree with Plan of Care  Patient       Patient will benefit from skilled therapeutic intervention in order to improve the following deficits and impairments:  Abnormal gait, Decreased activity tolerance, Difficulty walking, Impaired flexibility, Decreased strength, Decreased range of motion, Increased muscle spasms, Postural dysfunction, Pain, Decreased mobility, Impaired sensation  Visit Diagnosis: Chronic left-sided low back pain, with sciatica presence unspecified  Pain in left hip  Muscle weakness (generalized)  Other muscle spasm     Problem List Patient Active Problem List   Diagnosis Date Noted  . Pelvic relaxation 02/21/2017  . 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 Dysphagia 03/01/2011  .  Night sweats 02/20/2011  . Fatigue/Loss of Sleep 02/20/2011  . Mitral valve prolapse 02/20/2011  . FHx: allergies 02/20/2011  . Multiple thyroid nodules 02/20/2011  . Rash/bruises easily 02/20/2011  . Dizziness - light-headed/fainting 02/20/2011  . Generalized headaches 02/20/2011  . Retinitis pigmentosa 02/20/2011  . Sinus problem 02/20/2011  . Runny nose 02/20/2011  . Hoarseness/sore throat 02/20/2011  . Wears glasses/contacts 02/20/2011  . Arthritis 02/20/2011  . Family history of breast cancer 02/20/2011   11:10 AM,10/23/17 Sherol Dade PT, DPT [facility] [address], [place],  [place], [address] Phone: [phone]   Fax:  [phone]  Name: LAELYN BLUMENTHAL MRN: [id number] Date of Birth: [birth year]

## 2017-10-25 ENCOUNTER — Ambulatory Visit: Payer: PPO | Admitting: Physical Therapy

## 2017-10-25 DIAGNOSIS — J301 Allergic rhinitis due to pollen: Secondary | ICD-10-CM | POA: Diagnosis not present

## 2017-10-25 DIAGNOSIS — M545 Low back pain: Principal | ICD-10-CM

## 2017-10-25 DIAGNOSIS — M62838 Other muscle spasm: Secondary | ICD-10-CM

## 2017-10-25 DIAGNOSIS — M6281 Muscle weakness (generalized): Secondary | ICD-10-CM

## 2017-10-25 DIAGNOSIS — M25552 Pain in left hip: Secondary | ICD-10-CM

## 2017-10-25 DIAGNOSIS — G8929 Other chronic pain: Secondary | ICD-10-CM

## 2017-10-25 NOTE — Patient Instructions (Signed)

## 2017-10-25 NOTE — Therapy (Signed)
[facility] [address] [place], [place], [address] Phone: [phone]   Fax:  [phone]  Physical Therapy Treatment  Patient Details  Name: Alyssa Fisher MRN: [id number] Date of Birth: [birth year] Referring Provider: Rachell Cipro, MD    Encounter Date: 10/25/2017  PT End of Session - 10/25/17 1127    Visit Number  8    Number of Visits  16    Date for PT Re-Evaluation  11/14/17    Authorization Time Period  09/19/17 to 11/14/17    PT Start Time  1014    PT Stop Time  1112 10 min untimed ice modality    PT Time Calculation (min)  58 min    Activity Tolerance  Patient tolerated treatment well;No increased pain    Behavior During Therapy  WFL for tasks assessed/performed       Past Medical History:  Diagnosis Date  . Anxiety   . Broken ankle   . Dysphagia 10-23-12   due to thyroid issues  . GERD (gastroesophageal reflux disease)   . History of tonsillectomy   . Hypothyroidism   . Miscarriage    One - date not on medical form.  . Mitral valve prolapse 10/23/2012  . 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 Rosacea   . Seasonal allergies    uses albuterol inhaler as needed for allergies  . SVD (spontaneous vaginal delivery)    x 5  . Thyroid goiter 10-23-12   multinodular- has been followed x 37yrs    Past Surgical History:  Procedure Laterality Date  . ANTERIOR AND POSTERIOR REPAIR N/A 02/21/2017   Procedure: ANTERIOR (CYSTOCELE) AND POSTERIOR REPAIR (RECTOCELE);  Surgeon: Cheri Fowler, MD;  Location: [facility];  Service: Gynecology;  Laterality: N/A;  . BILATERAL SALPINGECTOMY N/A 02/21/2017   Procedure: BILATERAL SALPINGECTOMY;  Surgeon: Cheri Fowler, MD;  Location: [facility];  Service: Gynecology;  Laterality: N/A;  . BLADDER SUSPENSION N/A 02/21/2017   Procedure: Solyx Sling;  Surgeon: Cheri Fowler, MD;  Location: [facility];  Service: Gynecology;  Laterality: N/A;  . broken ankle  Right    Pins/rod inserted-retained  . COLONOSCOPY    . DILATION AND CURETTAGE OF UTERUS     MAB  . THYROIDECTOMY N/A 10/29/2012   Procedure: THYROIDECTOMY;  Surgeon: Earnstine Regal, MD;  [facility];  Service: General;  Laterality: N/A;  Total Thyroidectomy  . TONSILLECTOMY    . VAGINAL HYSTERECTOMY Bilateral 02/21/2017   Procedure: HYSTERECTOMY VAGINAL;  Surgeon: Cheri Fowler, MD;  Location: [facility];  Service: Gynecology;  Laterality: Bilateral;  . WISDOM TOOTH EXTRACTION      There were no vitals filed for this visit.  Subjective Assessment - 10/25/17 1016    Subjective  Pt reports that she saw her MD on Tuesday and they were able to give her an injection in the hip. She has been trying to get back into her exercises more.     Pertinent History  Legally blind (very narrow vision)    Limitations  Other (comment) going up/down stairs    Currently in Pain?  No/denies    Pain Onset  More than a month ago                      [facility] Adult PT Treatment/Exercise - 10/25/17 0001      Lumbar Exercises: Stretches   Figure 4 Stretch  2 reps;30 seconds  Lumbar Exercises: Aerobic   Nustep  L2 x5 min, PT present to discuss dry needling       Lumbar Exercises: Seated   Sit to Stand  15 reps;Limitations    Sit to Stand Limitations  addition of yellow TB around knees and RLE forward       Lumbar Exercises: Supine   Bridge with Ball Squeeze  15 reps      Cryotherapy   Number Minutes Cryotherapy  10 Minutes    Cryotherapy Location  Hip;Other (comment) Lt greater trochanter region    Type of Cryotherapy  Ice pack      Manual Therapy   Soft tissue mobilization  STM Lt glute max/med    Passive ROM  Lt glute max stretch x5 reps        Trigger Point Dry Needling - 10/25/17 1019    Consent Given?  Yes    Education Handout Provided  Yes    Muscles Treated Lower Body  Gluteus maximus;Gluteus minimus Lt hip    Gluteus Maximus Response  Twitch response  elicited;Palpable increased muscle length    Gluteus Minimus Response  Twitch response elicited;Palpable increased muscle length           PT Education - 10/25/17 1112    Education provided  Yes    Education Details  dry needling aftercare; changes in gait mechanics, etc. following injury and how that can impact muscle/restrictions throughout the LE    Person(s) Educated  Patient    Methods  Explanation;Handout    Comprehension  Verbalized understanding       PT Short Term Goals - 10/25/17 1135      PT SHORT TERM GOAL #1   Title  Pt will demo consistency and independence with her HEP to decrease pain and improve LE strength.    Time  4    Period  Weeks    Status  Achieved      PT SHORT TERM GOAL #2   Title  Pt will demo proper placement and use of lumbar roll while seated, to improve posture and pain with sittitng throughout the day.     Time  4    Period  Weeks    Status  Achieved      PT SHORT TERM GOAL #3   Title  Pt will report atleast 25% improvement in her pain/symptoms from the start of therapy, to allow her to complete daily activity.     Time  4    Period  Weeks    Status  Achieved        PT Long Term Goals - 10/25/17 1135      PT LONG TERM GOAL #1   Title  Pt will demo improved BLE strength to atleast 4/5 MMT to improve her efficiency and safety with daily activity.     Time  8    Period  Weeks    Status  On-going      PT LONG TERM GOAL #2   Title  Pt will be able to get on/off the floor with no more than therapist cuing for hand/foot placement, 2/3 trials, which will increase her ability to interract with her grandchildren.     Time  8    Period  Weeks    Status  On-going      PT LONG TERM GOAL #3   Title  Pt will report atleast 50% reduction in her pain/symptoms from the start of therapy, to increase her quality of  life and allow her to complete ADLs without need for assistance.     Time  8    Period  Weeks    Status  On-going      PT LONG TERM  GOAL #4   Title  Pt will be able to ascend/descend 1 flight of stairs with no more than 2/10 pain report, to allow her to get around her house without difficulty.    Time  8    Period  Weeks    Status  On-going      PT LONG TERM GOAL #5   Title  Pt will demo improved hip strength and stability evident by her ability to complete 5x sit to stand in less than 13 sec (without UE support) and without knee valgus deviation.     Time  8    Period  Weeks    Status  On-going            Plan - 10/25/17 1101    Clinical Impression Statement  Pt arrived with improved hip pain following an injection to the Lt hip earlier this week. She has also increased her HEP adherence since her las visit. Completed dry needling to the Lt gluteals, noting several twitch responses and palpable increase in tissue length following treatment. Pt was educated on expectations and after care, and she verbalized good understanding. Began functional strengthening activity including sit to stand and pt demonstrated preference for weight shift to the Rt, requiring cuing to improve this. Ended session without report of pain. Will continue with current POC.     Rehab Potential  Good    PT Frequency  2x / week    PT Duration  8 weeks    PT Treatment/Interventions  ADLs/Self Care Home Management;Cryotherapy;Electrical Stimulation;Moist Heat;Traction;Iontophoresis 4mg /ml Dexamethasone;Gait training;Neuromuscular re-education;Stair training;Functional mobility training;Therapeutic activities;Therapeutic exercise;Patient/family education;Manual techniques;Taping;Dry needling;Passive range of motion    PT Next Visit Plan  f/u on DN, possibly needs more to glute max and lumbar multifidi; sit to stand strengthening and other functional/standing activity; soft tissue techniques as needed to decrease pain; progress hip strengthening    PT Home Exercise Plan  prone press up on elbows; side clams with green TB, LE nerve flossing, lumbar  roll, ab brace with bent knee lower; sidelying quadriceps    Consulted and Agree with Plan of Care  Patient       Patient will benefit from skilled therapeutic intervention in order to improve the following deficits and impairments:  Abnormal gait, Decreased activity tolerance, Difficulty walking, Impaired flexibility, Decreased strength, Decreased range of motion, Increased muscle spasms, Postural dysfunction, Pain, Decreased mobility, Impaired sensation  Visit Diagnosis: Chronic left-sided low back pain, with sciatica presence unspecified  Pain in left hip  Muscle weakness (generalized)  Other muscle spasm     Problem List Patient Active Problem List   Diagnosis Date Noted  . Pelvic relaxation 02/21/2017  . 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 Dysphagia 03/01/2011  . Night sweats 02/20/2011  . Fatigue/Loss of Sleep 02/20/2011  . Mitral valve prolapse 02/20/2011  . FHx: allergies 02/20/2011  . Multiple thyroid nodules 02/20/2011  . Rash/bruises easily 02/20/2011  . Dizziness - light-headed/fainting 02/20/2011  . Generalized headaches 02/20/2011  . Retinitis pigmentosa 02/20/2011  . Sinus problem 02/20/2011  . Runny nose 02/20/2011  . Hoarseness/sore throat 02/20/2011  . Wears glasses/contacts 02/20/2011  . Arthritis 02/20/2011  . Family history of breast cancer 02/20/2011   11:38 AM,10/25/17 Sherol Dade PT, DPT [facility]  [facility] [address] [place], [place], [address] Phone: [phone]   Fax:  [phone]  Name: Alyssa Fisher MRN: [id number] Date of Birth: 24-Jun-1966

## 2017-10-29 ENCOUNTER — Encounter: Payer: Self-pay | Admitting: Physical Therapy

## 2017-10-29 ENCOUNTER — Ambulatory Visit: Payer: PPO | Admitting: Physical Therapy

## 2017-10-29 DIAGNOSIS — G8929 Other chronic pain: Secondary | ICD-10-CM

## 2017-10-29 DIAGNOSIS — M62838 Other muscle spasm: Secondary | ICD-10-CM

## 2017-10-29 DIAGNOSIS — M25552 Pain in left hip: Secondary | ICD-10-CM

## 2017-10-29 DIAGNOSIS — M6281 Muscle weakness (generalized): Secondary | ICD-10-CM

## 2017-10-29 DIAGNOSIS — M545 Low back pain: Secondary | ICD-10-CM | POA: Diagnosis not present

## 2017-10-29 NOTE — Therapy (Signed)
[facility] [address] [place], [place], [address] Phone: [phone]   Fax:  [phone]  Physical Therapy Treatment  Patient Details  Name: Alyssa Fisher MRN: [id number] Date of Birth: December 11, 1965 Referring Provider: Rachell Cipro, MD    Encounter Date: 10/29/2017  PT End of Session - 10/29/17 1141    Visit Number  9    Number of Visits  16    Date for PT Re-Evaluation  11/14/17    Authorization Time Period  09/19/17 to 11/14/17    PT Start Time  1016    PT Stop Time  1058    PT Time Calculation (min)  42 min    Activity Tolerance  Patient tolerated treatment well;No increased pain    Behavior During Therapy  WFL for tasks assessed/performed       Past Medical History:  Diagnosis Date  . Anxiety   . Broken ankle   . Dysphagia 10-23-12   due to thyroid issues  . GERD (gastroesophageal reflux disease)   . History of tonsillectomy   . Hypothyroidism   . Miscarriage    One - date not on medical form.  . Mitral valve prolapse 10/23/2012  . 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 Rosacea   . Seasonal allergies    uses albuterol inhaler as needed for allergies  . SVD (spontaneous vaginal delivery)    x 5  . Thyroid goiter 10-23-12   multinodular- has been followed x 31yrs    Past Surgical History:  Procedure Laterality Date  . ANTERIOR AND POSTERIOR REPAIR N/A 02/21/2017   Procedure: ANTERIOR (CYSTOCELE) AND POSTERIOR REPAIR (RECTOCELE);  Surgeon: Cheri Fowler, MD;  Location: [facility];  Service: Gynecology;  Laterality: N/A;  . BILATERAL SALPINGECTOMY N/A 02/21/2017   Procedure: BILATERAL SALPINGECTOMY;  Surgeon: Cheri Fowler, MD;  Location: [facility];  Service: Gynecology;  Laterality: N/A;  . BLADDER SUSPENSION N/A 02/21/2017   Procedure: Solyx Sling;  Surgeon: Cheri Fowler, MD;  Location: [facility];  Service: Gynecology;  Laterality: N/A;  . broken ankle Right    Pins/rod  inserted-retained  . COLONOSCOPY    . DILATION AND CURETTAGE OF UTERUS     MAB  . THYROIDECTOMY N/A 10/29/2012   Procedure: THYROIDECTOMY;  Surgeon: Earnstine Regal, MD;  Location: [facility];  Service: General;  Laterality: N/A;  Total Thyroidectomy  . TONSILLECTOMY    . VAGINAL HYSTERECTOMY Bilateral 02/21/2017   Procedure: HYSTERECTOMY VAGINAL;  Surgeon: Cheri Fowler, MD;  Location: [facility];  Service: Gynecology;  Laterality: Bilateral;  . WISDOM TOOTH EXTRACTION      There were no vitals filed for this visit.  Subjective Assessment - 10/29/17 1019    Subjective  Pt reports that things are going well. She had some Lt glute pain which resolved after using the tennis ball yesterday. She continues to complete her HEP as instructed.    Pertinent History  Legally blind (very narrow vision)    Limitations  Other (comment) going up/down stairs    Currently in Pain?  No/denies    Pain Onset  More than a month ago           [facility] Adult PT Treatment/Exercise - 10/29/17 0001      Lumbar Exercises: Seated   Sit to Stand  15 reps    Sit to Stand Limitations  red TB around knees to encourage knee valgus control       Lumbar Exercises:  Supine   Pelvic Tilt  --    Pelvic Tilt Limitations  --    Bent Knee Raise  --    Bent Knee Raise Limitations  --      Lumbar Exercises: Sidelying   Clam  --    Clam Limitations  --      Manual Therapy   Soft tissue mobilization  STM Lt glute max; TPR Lt glute max/piriformis     Passive ROM  Lt glute max stretch x5 reps       Trigger Point Dry Needling - 10/29/17 1238    Consent Given?  Yes    Education Handout Provided  No provided last session    Muscles Treated Lower Body  Gluteus maximus    Gluteus Maximus Response  Twitch response elicited;Palpable increased muscle length           PT Education - 10/29/17 1140    Education provided  Yes    Education Details  impact motor control and areas of weakness can have on body mechanics;  implications for band around knees during sit to stand     Person(s) Educated  Patient    Methods  Explanation;Verbal cues    Comprehension  Verbalized understanding;Returned demonstration       PT Short Term Goals - 10/25/17 1135      PT SHORT TERM GOAL #1   Title  Pt will demo consistency and independence with her HEP to decrease pain and improve LE strength.    Time  4    Period  Weeks    Status  Achieved      PT SHORT TERM GOAL #2   Title  Pt will demo proper placement and use of lumbar roll while seated, to improve posture and pain with sittitng throughout the day.     Time  4    Period  Weeks    Status  Achieved      PT SHORT TERM GOAL #3   Title  Pt will report atleast 25% improvement in her pain/symptoms from the start of therapy, to allow her to complete daily activity.     Time  4    Period  Weeks    Status  Achieved        PT Long Term Goals - 10/25/17 1135      PT LONG TERM GOAL #1   Title  Pt will demo improved BLE strength to atleast 4/5 MMT to improve her efficiency and safety with daily activity.     Time  8    Period  Weeks    Status  On-going      PT LONG TERM GOAL #2   Title  Pt will be able to get on/off the floor with no more than therapist cuing for hand/foot placement, 2/3 trials, which will increase her ability to interract with her grandchildren.     Time  8    Period  Weeks    Status  On-going      PT LONG TERM GOAL #3   Title  Pt will report atleast 50% reduction in her pain/symptoms from the start of therapy, to increase her quality of life and allow her to complete ADLs without need for assistance.     Time  8    Period  Weeks    Status  On-going      PT LONG TERM GOAL #4   Title  Pt will be able to ascend/descend 1 flight of stairs with  no more than 2/10 pain report, to allow her to get around her house without difficulty.    Time  8    Period  Weeks    Status  On-going      PT LONG TERM GOAL #5   Title  Pt will demo improved hip  strength and stability evident by her ability to complete 5x sit to stand in less than 13 sec (without UE support) and without knee valgus deviation.     Time  8    Period  Weeks    Status  On-going            Plan - 10/29/17 1141    Clinical Impression Statement  Pt arrived with reports of nearly full pain resolution following needling last session. She was able to complete self-massage at home to decrease pain onset yesterday. Continue to note soft tissue restrictions in the glutes and therapist addressed this with dry needling and other manual techniques. End of session, therapist was able to further educate the pt on benefits of improving strength and control of the Lt hip moving forward and made an addition to her HEP. Pt will continue to need feedback with functional strengthening exercise moving forward. Ended session without report of pain.    Rehab Potential  Good    PT Frequency  2x / week    PT Duration  8 weeks    PT Treatment/Interventions  ADLs/Self Care Home Management;Cryotherapy;Electrical Stimulation;Moist Heat;Traction;Iontophoresis 4mg /ml Dexamethasone;Gait training;Neuromuscular re-education;Stair training;Functional mobility training;Therapeutic activities;Therapeutic exercise;Patient/family education;Manual techniques;Taping;Dry needling;Passive range of motion    PT Next Visit Plan  f/u on pain and sit to stand; progression of functional/standing activity; soft tissue techniques as needed to decrease pain    PT Home Exercise Plan  prone press up on elbows; side clams with green TB, LE nerve flossing, lumbar roll, ab brace with bent knee lower; sidelying quadriceps; sit to stand (red band and RLE forward)    Consulted and Agree with Plan of Care  Patient       Patient will benefit from skilled therapeutic intervention in order to improve the following deficits and impairments:  Abnormal gait, Decreased activity tolerance, Difficulty walking, Impaired flexibility,  Decreased strength, Decreased range of motion, Increased muscle spasms, Postural dysfunction, Pain, Decreased mobility, Impaired sensation  Visit Diagnosis: Chronic left-sided low back pain, with sciatica presence unspecified  Pain in left hip  Muscle weakness (generalized)  Other muscle spasm     Problem List Patient Active Problem List   Diagnosis Date Noted  . Pelvic relaxation 02/21/2017  . 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 Dysphagia 03/01/2011  . Night sweats 02/20/2011  . Fatigue/Loss of Sleep 02/20/2011  . Mitral valve prolapse 02/20/2011  . FHx: allergies 02/20/2011  . Multiple thyroid nodules 02/20/2011  . Rash/bruises easily 02/20/2011  . Dizziness - light-headed/fainting 02/20/2011  . Generalized headaches 02/20/2011  . Retinitis pigmentosa 02/20/2011  . Sinus problem 02/20/2011  . Runny nose 02/20/2011  . Hoarseness/sore throat 02/20/2011  . Wears glasses/contacts 02/20/2011  . Arthritis 02/20/2011  . Family history of breast cancer 02/20/2011    12:38 PM,10/29/17 Sherol Dade PT, DPT [place] [facility] [address], [place], [place], [address] Phone: [phone]   Fax:  [phone]  Name: Alyssa Fisher MRN: [id number] Date of Birth: [birth year]

## 2017-10-29 NOTE — Patient Instructions (Signed)
   HIP FLEXOR STRETCH 4  While lying on a table or high bed, let the affected leg lower towards the floor until a stretch is felt along the front of your thigh.    At the same time, slowly bend your affected knee to add more stretch and grasp your opposite knee and pull it towards your chest.   Hold 30 sec, 3x each side (atleast on the right)     [facility] [address] [place], [place] [address] Phone # [phone] Fax [phone]

## 2017-10-30 DIAGNOSIS — E039 Hypothyroidism, unspecified: Secondary | ICD-10-CM | POA: Diagnosis not present

## 2017-10-31 ENCOUNTER — Ambulatory Visit: Payer: PPO | Admitting: Physical Therapy

## 2017-10-31 ENCOUNTER — Encounter: Payer: Self-pay | Admitting: Physical Therapy

## 2017-10-31 DIAGNOSIS — M62838 Other muscle spasm: Secondary | ICD-10-CM

## 2017-10-31 DIAGNOSIS — Z6828 Body mass index (BMI) 28.0-28.9, adult: Secondary | ICD-10-CM | POA: Diagnosis not present

## 2017-10-31 DIAGNOSIS — M25552 Pain in left hip: Secondary | ICD-10-CM | POA: Diagnosis not present

## 2017-10-31 DIAGNOSIS — M545 Low back pain: Principal | ICD-10-CM

## 2017-10-31 DIAGNOSIS — M6281 Muscle weakness (generalized): Secondary | ICD-10-CM

## 2017-10-31 DIAGNOSIS — G8929 Other chronic pain: Secondary | ICD-10-CM

## 2017-10-31 DIAGNOSIS — E039 Hypothyroidism, unspecified: Secondary | ICD-10-CM | POA: Diagnosis not present

## 2017-10-31 NOTE — Patient Instructions (Addendum)
  SIT TO STAND - NO SUPPORT  Start by scooting close to the front of the chair.  Next, lean forward at your trunk and reach forward with your arms and rise to standing without using your hands to push off from the chair or other object.   Use your arms as a counter-balance by reaching forward when in sitting and lower them as you approach standing.       LOOPED ELASTIC BAND HIP EXTENSION  While standing with an elastic band looped around your ankles, move the target leg back as shown.   Keep your knees straight the entire time.   2x10 reps, yellow band.        ELASTIC BAND LATERAL WALKS   With an elastic band around your feet, take side steps. Your knees should be slightly bent the entire time.  Don't let your body twist.        Dead Bugs  Start: Lay on your back as pictured, perform a pelvic tilt by flattening your back onto the floor while bracing your core.  Movement: Begin  by cycling your legs as you move your arms while your maintain your pelvic tilt.  *Note-The second your back begins to arch, you have lost your pelvic tilt and should stop and rest.  3 x 30 seconds     [facility] [address] [place], [place] [address] Phone # [phone] Fax [phone]

## 2017-10-31 NOTE — Therapy (Signed)
[facility] [address], [place], [place], [address] Phone: [phone]   Fax:  [phone]  Physical Therapy Treatment  Patient Details  Name: Alyssa Fisher MRN: [id number] Date of Birth: 07-11-1966 Referring Provider: Rachell Cipro, MD    Encounter Date: 10/31/2017  PT End of Session - 10/31/17 1023    Visit Number  10    Number of Visits  16    Date for PT Re-Evaluation  11/14/17    Authorization Time Period  09/19/17 to 11/14/17    PT Start Time  0931    PT Stop Time  1013    PT Time Calculation (min)  42 min    Activity Tolerance  Patient tolerated treatment well;No increased pain    Behavior During Therapy  WFL for tasks assessed/performed       Past Medical History:  Diagnosis Date  . Anxiety   . Broken ankle   . Dysphagia 10-23-12   due to thyroid issues  . GERD (gastroesophageal reflux disease)   . History of tonsillectomy   . Hypothyroidism   . Miscarriage    One - date not on medical form.  . Mitral valve prolapse 10/23/2012  . 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 Rosacea   . Seasonal allergies    uses albuterol inhaler as needed for allergies  . SVD (spontaneous vaginal delivery)    x 5  . Thyroid goiter 10-23-12   multinodular- has been followed x 30yrs    Past Surgical History:  Procedure Laterality Date  . ANTERIOR AND POSTERIOR REPAIR N/A 02/21/2017   Procedure: ANTERIOR (CYSTOCELE) AND POSTERIOR REPAIR (RECTOCELE);  Surgeon: Cheri Fowler, MD;  Location: [facility];  Service: Gynecology;  Laterality: N/A;  . BILATERAL SALPINGECTOMY N/A 02/21/2017   Procedure: BILATERAL SALPINGECTOMY;  Surgeon: Cheri Fowler, MD;  Location: [facility];  Service: Gynecology;  Laterality: N/A;  . BLADDER SUSPENSION N/A 02/21/2017   Procedure: Solyx Sling;  Surgeon: Cheri Fowler, MD;  Location: [facility];  Service: Gynecology;  Laterality: N/A;  . broken ankle Right    Pins/rod  inserted-retained  . COLONOSCOPY    . DILATION AND CURETTAGE OF UTERUS     MAB  . THYROIDECTOMY N/A 10/29/2012   Procedure: THYROIDECTOMY;  Surgeon: Earnstine Regal, MD;  Location: [facility];  Service: General;  Laterality: N/A;  Total Thyroidectomy  . TONSILLECTOMY    . VAGINAL HYSTERECTOMY Bilateral 02/21/2017   Procedure: HYSTERECTOMY VAGINAL;  Surgeon: Cheri Fowler, MD;  Location: [facility];  Service: Gynecology;  Laterality: Bilateral;  . WISDOM TOOTH EXTRACTION      There were no vitals filed for this visit.  Subjective Assessment - 10/31/17 0934    Subjective  Pt reports that things are going well. She thinks things are continuing to get better.    Pertinent History  Legally blind (very narrow vision)    Limitations  Other (comment) going up/down stairs    Currently in Pain?  No/denies    Pain Onset  More than a month ago         [facility] PT Assessment - 10/31/17 0001      Strength   Right/Left Hip  Right;Left    Right Hip Flexion  5/5    Right Hip Extension  5/5    Right Hip ABduction  5/5    Left Hip Flexion  5/5    Left Hip Extension  5/5    Left Hip ABduction  5/5    Right Knee Flexion  5/5    Left Knee Flexion  5/5                  [facility] Adult PT Treatment/Exercise - 10/31/17 0001      Lumbar Exercises: Standing   Other Standing Lumbar Exercises  hip extension at countertop 2x10 reps each with yellow TB resistance    Other Standing Lumbar Exercises  sidestepping along countertop x5 steps each direction x3 rounds each direction (3rd round increased to red TB)       Lumbar Exercises: Seated   Sit to Stand  10 reps    Sit to Stand Limitations  minimal weight shift Rt      Lumbar Exercises: Supine   Dead Bug  10 reps    Other Supine Lumbar Exercises  supine bent knee 90/90 with trunk rotation Lt/Rt holding red weighted ball x10 reps each             PT Education - 10/31/17 1023    Education provided  Yes    Education Details  technique with  therex; noted progress towards goals    Person(s) Educated  Patient    Methods  Explanation;Handout    Comprehension  Verbalized understanding;Returned demonstration       PT Short Term Goals - 10/31/17 1024      PT SHORT TERM GOAL #1   Title  Pt will demo consistency and independence with her HEP to decrease pain and improve LE strength.    Time  4    Period  Weeks    Status  Achieved      PT SHORT TERM GOAL #2   Title  Pt will demo proper placement and use of lumbar roll while seated, to improve posture and pain with sittitng throughout the day.     Time  4    Period  Weeks    Status  Achieved      PT SHORT TERM GOAL #3   Title  Pt will report atleast 25% improvement in her pain/symptoms from the start of therapy, to allow her to complete daily activity.     Time  4    Period  Weeks    Status  Achieved        PT Long Term Goals - 10/31/17 1024      PT LONG TERM GOAL #1   Title  Pt will demo improved BLE strength to atleast 4/5 MMT to improve her efficiency and safety with daily activity.     Baseline  5/5 MMT    Time  8    Period  Weeks    Status  Achieved      PT LONG TERM GOAL #2   Title  Pt will be able to get on/off the floor with no more than therapist cuing for hand/foot placement, 2/3 trials, which will increase her ability to interract with her grandchildren.     Time  8    Period  Weeks    Status  On-going      PT LONG TERM GOAL #3   Title  Pt will report atleast 50% reduction in her pain/symptoms from the start of therapy, to increase her quality of life and allow her to complete ADLs without need for assistance.     Time  8    Period  Weeks    Status  On-going      PT LONG TERM GOAL #4   Title  Pt will be able to ascend/descend 1 flight of stairs with no more than 2/10 pain report, to allow her to get around her house without difficulty.    Time  8    Period  Weeks    Status  On-going      PT LONG TERM GOAL #5   Title  Pt will demo improved hip  strength and stability evident by her ability to complete 5x sit to stand in less than 13 sec (without UE support) and without knee valgus deviation.     Time  8    Period  Weeks    Status  On-going            Plan - 10/31/17 1024    Clinical Impression Statement  Pt is making steady progress, meeting another long term goal this session. BLE strength has improved to 5/5 MMT without report of pain with resistance. Due to progress in strength, pt's HEP was updated and she was able to demonstrate good understanding of new additions. Also continued with trunk stabilization and endurance exercise without reports of pain during or following today's session. Will continue with current POC.     Rehab Potential  Good    PT Frequency  2x / week    PT Duration  8 weeks    PT Treatment/Interventions  ADLs/Self Care Home Management;Cryotherapy;Electrical Stimulation;Moist Heat;Traction;Iontophoresis 4mg /ml Dexamethasone;Gait training;Neuromuscular re-education;Stair training;Functional mobility training;Therapeutic activities;Therapeutic exercise;Patient/family education;Manual techniques;Taping;Dry needling;Passive range of motion    PT Next Visit Plan  conitnue with progression of functional/standing activity (lunges?); soft tissue techniques as needed to decrease pain    PT Home Exercise Plan  standing hip ext (yellow), side stepping (red), dead bug, sit to stand     Consulted and Agree with Plan of Care  Patient       Patient will benefit from skilled therapeutic intervention in order to improve the following deficits and impairments:  Abnormal gait, Decreased activity tolerance, Difficulty walking, Impaired flexibility, Decreased strength, Decreased range of motion, Increased muscle spasms, Postural dysfunction, Pain, Decreased mobility, Impaired sensation  Visit Diagnosis: Chronic left-sided low back pain, with sciatica presence unspecified  Pain in left hip  Muscle weakness  (generalized)  Other muscle spasm     Problem List Patient Active Problem List   Diagnosis Date Noted  . Pelvic relaxation 02/21/2017  . 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 Dysphagia 03/01/2011  . Night sweats 02/20/2011  . Fatigue/Loss of Sleep 02/20/2011  . Mitral valve prolapse 02/20/2011  . FHx: allergies 02/20/2011  . Multiple thyroid nodules 02/20/2011  . Rash/bruises easily 02/20/2011  . Dizziness - light-headed/fainting 02/20/2011  . Generalized headaches 02/20/2011  . Retinitis pigmentosa 02/20/2011  . Sinus problem 02/20/2011  . Runny nose 02/20/2011  . Hoarseness/sore throat 02/20/2011  . Wears glasses/contacts 02/20/2011  . Arthritis 02/20/2011  . Family history of breast cancer 02/20/2011    10:35 AM,10/31/17 Sherol Dade PT, DPT [place] [facility] [address], [place], [place], [address] Phone: [phone]   Fax:  [phone]  Name: Alyssa Fisher MRN: [id number] Date of Birth: [birth year]

## 2017-11-01 DIAGNOSIS — J301 Allergic rhinitis due to pollen: Secondary | ICD-10-CM | POA: Diagnosis not present

## 2017-11-05 ENCOUNTER — Encounter: Payer: Self-pay | Admitting: Physical Therapy

## 2017-11-05 ENCOUNTER — Ambulatory Visit: Payer: PPO | Attending: Family Medicine | Admitting: Physical Therapy

## 2017-11-05 DIAGNOSIS — M6281 Muscle weakness (generalized): Secondary | ICD-10-CM | POA: Insufficient documentation

## 2017-11-05 DIAGNOSIS — G8929 Other chronic pain: Secondary | ICD-10-CM | POA: Diagnosis not present

## 2017-11-05 DIAGNOSIS — M545 Low back pain: Secondary | ICD-10-CM | POA: Diagnosis not present

## 2017-11-05 DIAGNOSIS — M25552 Pain in left hip: Secondary | ICD-10-CM

## 2017-11-05 DIAGNOSIS — M62838 Other muscle spasm: Secondary | ICD-10-CM | POA: Diagnosis not present

## 2017-11-05 NOTE — Therapy (Signed)
[facility] [address] [place], [place], [address] Phone: [phone]   Fax:  [phone]  Physical Therapy Treatment  Patient Details  Name: Alyssa Fisher MRN: [id number] Date of Birth: [birth year] Referring Provider: Rachell Cipro, MD    Encounter Date: 11/05/2017  PT End of Session - 11/05/17 0926    Visit Number  11    Number of Visits  16    Date for PT Re-Evaluation  11/14/17    Authorization Time Period  09/19/17 to 11/14/17    PT Start Time  0846    PT Stop Time  0927    PT Time Calculation (min)  41 min    Activity Tolerance  Patient tolerated treatment well;No increased pain    Behavior During Therapy  WFL for tasks assessed/performed       Past Medical History:  Diagnosis Date  . Anxiety   . Broken ankle   . Dysphagia 10-23-12   due to thyroid issues  . GERD (gastroesophageal reflux disease)   . History of tonsillectomy   . Hypothyroidism   . Miscarriage    One - date not on medical form.  . Mitral valve prolapse 10/23/2012  . 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 Rosacea   . Seasonal allergies    uses albuterol inhaler as needed for allergies  . SVD (spontaneous vaginal delivery)    x 5  . Thyroid goiter 10-23-12   multinodular- has been followed x 57yrs    Past Surgical History:  Procedure Laterality Date  . ANTERIOR AND POSTERIOR REPAIR N/A 02/21/2017   Procedure: ANTERIOR (CYSTOCELE) AND POSTERIOR REPAIR (RECTOCELE);  Surgeon: Cheri Fowler, MD;  Location: [facility];  Service: Gynecology;  Laterality: N/A;  . BILATERAL SALPINGECTOMY N/A 02/21/2017   Procedure: BILATERAL SALPINGECTOMY;  Surgeon: Cheri Fowler, MD;  Location: [facility];  Service: Gynecology;  Laterality: N/A;  . BLADDER SUSPENSION N/A 02/21/2017   Procedure: Solyx Sling;  Surgeon: Cheri Fowler, MD;  Location: [facility];  Service: Gynecology;  Laterality: N/A;  . broken ankle Right    Pins/rod  inserted-retained  . COLONOSCOPY    . DILATION AND CURETTAGE OF UTERUS     MAB  . THYROIDECTOMY N/A 10/29/2012   Procedure: THYROIDECTOMY;  Surgeon: Earnstine Regal, MD;  Location: [facility];  Service: General;  Laterality: N/A;  Total Thyroidectomy  . TONSILLECTOMY    . VAGINAL HYSTERECTOMY Bilateral 02/21/2017   Procedure: HYSTERECTOMY VAGINAL;  Surgeon: Cheri Fowler, MD;  Location: [facility];  Service: Gynecology;  Laterality: Bilateral;  . WISDOM TOOTH EXTRACTION      There were no vitals filed for this visit.  Subjective Assessment - 11/05/17 0848    Subjective  Pt reports that she had a busy weekend. She did notice her Lt hip was a little sore, but not pain like it used to be. She says her hip has not been as bad since that once day over the weekend.     Pertinent History  Legally blind (very narrow vision)    Limitations  Other (comment) going up/down stairs    Currently in Pain?  No/denies    Pain Onset  More than a month ago                      [facility] Adult PT Treatment/Exercise - 11/05/17 0001      Lumbar Exercises: Machines for Strengthening   Leg Press  seat  6, BLE 70# increased to 90# second set 2x15 reps  cues to push through heels       Lumbar Exercises: Standing   Heel Raises  Limitations;10 reps    Heel Raises Limitations  single leg, x2 sets     Functional Squats  10 reps;Limitations    Functional Squats Limitations  x2 sets, cues to increase hip flexion and posterior weight shift x2 sets     Forward Lunge  15 reps;Limitations    Forward Lunge Limitations  BUE support, heavy cuing to decreased forward weight shift       Lumbar Exercises: Supine   Bent Knee Raise  15 reps    Bent Knee Raise Limitations  with alternating UE flexion (yellow TB)       Lumbar Exercises: Sidelying   Hip Abduction  Both;10 reps;Limitations    Hip Abduction Limitations  x2 sets, 2# ankle weight      Manual Therapy   Soft tissue mobilization  STM Lt TFL              PT Education - 11/05/17 7253    Education provided  Yes    Education Details  technique with therex    Person(s) Educated  Patient    Methods  Explanation;Verbal cues;Tactile cues    Comprehension  Verbalized understanding;Returned demonstration       PT Short Term Goals - 10/31/17 1024      PT SHORT TERM GOAL #1   Title  Pt will demo consistency and independence with her HEP to decrease pain and improve LE strength.    Time  4    Period  Weeks    Status  Achieved      PT SHORT TERM GOAL #2   Title  Pt will demo proper placement and use of lumbar roll while seated, to improve posture and pain with sittitng throughout the day.     Time  4    Period  Weeks    Status  Achieved      PT SHORT TERM GOAL #3   Title  Pt will report atleast 25% improvement in her pain/symptoms from the start of therapy, to allow her to complete daily activity.     Time  4    Period  Weeks    Status  Achieved        PT Long Term Goals - 10/31/17 1024      PT LONG TERM GOAL #1   Title  Pt will demo improved BLE strength to atleast 4/5 MMT to improve her efficiency and safety with daily activity.     Baseline  5/5 MMT    Time  8    Period  Weeks    Status  Achieved      PT LONG TERM GOAL #2   Title  Pt will be able to get on/off the floor with no more than therapist cuing for hand/foot placement, 2/3 trials, which will increase her ability to interract with her grandchildren.     Time  8    Period  Weeks    Status  On-going      PT LONG TERM GOAL #3   Title  Pt will report atleast 50% reduction in her pain/symptoms from the start of therapy, to increase her quality of life and allow her to complete ADLs without need for assistance.     Time  8    Period  Weeks    Status  On-going  PT LONG TERM GOAL #4   Title  Pt will be able to ascend/descend 1 flight of stairs with no more than 2/10 pain report, to allow her to get around her house without difficulty.    Time  8     Period  Weeks    Status  On-going      PT LONG TERM GOAL #5   Title  Pt will demo improved hip strength and stability evident by her ability to complete 5x sit to stand in less than 13 sec (without UE support) and without knee valgus deviation.     Time  8    Period  Weeks    Status  On-going            Plan - 11/05/17 1610    Clinical Impression Statement  Pt continues to complete HEP consistently. Minimal Lt hip pain with activity over the weekend and no pain upon arrival. Focused on progressions of LE strengthening, adding weight to hip abduction this session. Pt did report increase in pain along the Lt TFL which was resolved following manual treatment to the area. Ended session without report of increased hip pain. Will continue with current POC.    Rehab Potential  Good    PT Frequency  2x / week    PT Duration  8 weeks    PT Treatment/Interventions  ADLs/Self Care Home Management;Cryotherapy;Electrical Stimulation;Moist Heat;Traction;Iontophoresis 4mg /ml Dexamethasone;Gait training;Neuromuscular re-education;Stair training;Functional mobility training;Therapeutic activities;Therapeutic exercise;Patient/family education;Manual techniques;Taping;Dry needling;Passive range of motion    PT Next Visit Plan  continue with progression of functional/standing activity lunges; soft tissue techniques as needed to decrease pain    PT Home Exercise Plan  standing hip ext (yellow), side stepping (red), dead bug, sit to stand     Consulted and Agree with Plan of Care  Patient       Patient will benefit from skilled therapeutic intervention in order to improve the following deficits and impairments:  Abnormal gait, Decreased activity tolerance, Difficulty walking, Impaired flexibility, Decreased strength, Decreased range of motion, Increased muscle spasms, Postural dysfunction, Pain, Decreased mobility, Impaired sensation  Visit Diagnosis: Chronic left-sided low back pain, with sciatica presence  unspecified  Muscle weakness (generalized)  Other muscle spasm  Pain in left hip     Problem List Patient Active Problem List   Diagnosis Date Noted  . Pelvic relaxation 02/21/2017  . 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 Dysphagia 03/01/2011  . Night sweats 02/20/2011  . Fatigue/Loss of Sleep 02/20/2011  . Mitral valve prolapse 02/20/2011  . FHx: allergies 02/20/2011  . Multiple thyroid nodules 02/20/2011  . Rash/bruises easily 02/20/2011  . Dizziness - light-headed/fainting 02/20/2011  . Generalized headaches 02/20/2011  . Retinitis pigmentosa 02/20/2011  . Sinus problem 02/20/2011  . Runny nose 02/20/2011  . Hoarseness/sore throat 02/20/2011  . Wears glasses/contacts 02/20/2011  . Arthritis 02/20/2011  . Family history of breast cancer 02/20/2011   9:32 AM,11/05/17 Sherol Dade PT, DPT [facility] [address], [place], [place], [address] Phone: [phone]   Fax:  [phone]  Name: ELLIEANA DOLECKI MRN: [id number] Date of Birth: 12/02/1965

## 2017-11-07 ENCOUNTER — Ambulatory Visit: Payer: PPO | Admitting: Physical Therapy

## 2017-11-07 ENCOUNTER — Encounter: Payer: Self-pay | Admitting: Physical Therapy

## 2017-11-07 DIAGNOSIS — M25552 Pain in left hip: Secondary | ICD-10-CM

## 2017-11-07 DIAGNOSIS — M545 Low back pain: Secondary | ICD-10-CM | POA: Diagnosis not present

## 2017-11-07 DIAGNOSIS — G8929 Other chronic pain: Secondary | ICD-10-CM

## 2017-11-07 DIAGNOSIS — M6281 Muscle weakness (generalized): Secondary | ICD-10-CM

## 2017-11-07 DIAGNOSIS — M62838 Other muscle spasm: Secondary | ICD-10-CM

## 2017-11-07 NOTE — Therapy (Signed)
[facility] [address], [place], [place], [address] Phone: [phone]   Fax:  [phone]  Physical Therapy Treatment  Patient Details  Name: Alyssa Fisher MRN: [id number] Date of Birth: 01-05-1966 Referring Provider: Rachell Cipro, MD    Encounter Date: 11/07/2017  PT End of Session - 11/07/17 0856    Visit Number  12    Number of Visits  16    Date for PT Re-Evaluation  11/14/17    Authorization Time Period  09/19/17 to 11/14/17    PT Start Time  0847    PT Stop Time  0926    PT Time Calculation (min)  39 min    Activity Tolerance  Patient tolerated treatment well;No increased pain    Behavior During Therapy  WFL for tasks assessed/performed       Past Medical History:  Diagnosis Date  . Anxiety   . Broken ankle   . Dysphagia 10-23-12   due to thyroid issues  . GERD (gastroesophageal reflux disease)   . History of tonsillectomy   . Hypothyroidism   . Miscarriage    One - date not on medical form.  . Mitral valve prolapse 10/23/2012  . 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 Rosacea   . Seasonal allergies    uses albuterol inhaler as needed for allergies  . SVD (spontaneous vaginal delivery)    x 5  . Thyroid goiter 10-23-12   multinodular- has been followed x 10yrs    Past Surgical History:  Procedure Laterality Date  . ANTERIOR AND POSTERIOR REPAIR N/A 02/21/2017   Procedure: ANTERIOR (CYSTOCELE) AND POSTERIOR REPAIR (RECTOCELE);  Surgeon: Cheri Fowler, MD;  Location: [facility];  Service: Gynecology;  Laterality: N/A;  . BILATERAL SALPINGECTOMY N/A 02/21/2017   Procedure: BILATERAL SALPINGECTOMY;  Surgeon: Cheri Fowler, MD;  Location: [facility];  Service: Gynecology;  Laterality: N/A;  . BLADDER SUSPENSION N/A 02/21/2017   Procedure: Solyx Sling;  Surgeon: Cheri Fowler, MD;  Location: [facility];  Service: Gynecology;  Laterality: N/A;  . broken ankle Right    Pins/rod  inserted-retained  . COLONOSCOPY    . DILATION AND CURETTAGE OF UTERUS     MAB  . THYROIDECTOMY N/A 10/29/2012   Procedure: THYROIDECTOMY;  Surgeon: Earnstine Regal, MD;  Location: [facility];  Service: General;  Laterality: N/A;  Total Thyroidectomy  . TONSILLECTOMY    . VAGINAL HYSTERECTOMY Bilateral 02/21/2017   Procedure: HYSTERECTOMY VAGINAL;  Surgeon: Cheri Fowler, MD;  Location: [facility];  Service: Gynecology;  Laterality: Bilateral;  . WISDOM TOOTH EXTRACTION      There were no vitals filed for this visit.  Subjective Assessment - 11/07/17 0849    Subjective  Pt repotrs that she has a little achiness in her Lt hip. Nothing crazy, and she didn't have to take medication.    Pertinent History  Legally blind (very narrow vision)    Limitations  Other (comment) going up/down stairs    Currently in Pain?  Yes    Pain Score  2     Pain Location  Hip    Pain Orientation  Left    Pain Descriptors / Indicators  Aching    Pain Type  Chronic pain    Pain Radiating Towards  none     Pain Onset  More than a month ago    Pain Frequency  Constant    Aggravating Factors   unsure  Pain Relieving Factors  exercise    Effect of Pain on Daily Activities  mild               [facility] Adult PT Treatment/Exercise - 11/07/17 0001      Lumbar Exercises: Machines for Strengthening   Leg Press  Seat 6, BLE 110# 2x10 reps with red TB around knees to decrease knee valgus       Lumbar Exercises: Standing   Other Standing Lumbar Exercises  forward step ups on 4" box with 1 UE support, x15 reps each       Lumbar Exercises: Seated   Other Seated Lumbar Exercises  seated hip flexion x15 reps each with abdominal brace, 2nd set on dyna disc x15 reps each LE (therapist tactile cues to decrease Lt hip IR compensations    Other Seated Lumbar Exercises  on dyna disc with knee extension 3x5 reps each, therapist cues to decrease LLE compensations       Manual Therapy   Soft tissue mobilization  STM Lt glute  med/TFL, TPR Lt glute med             PT Education - 11/07/17 0949    Education provided  Yes    Education Details  upcomin re-evaluation; technique with therex    Person(s) Educated  Patient    Methods  Explanation;Verbal cues;Tactile cues    Comprehension  Verbalized understanding;Returned demonstration       PT Short Term Goals - 10/31/17 1024      PT SHORT TERM GOAL #1   Title  Pt will demo consistency and independence with her HEP to decrease pain and improve LE strength.    Time  4    Period  Weeks    Status  Achieved      PT SHORT TERM GOAL #2   Title  Pt will demo proper placement and use of lumbar roll while seated, to improve posture and pain with sittitng throughout the day.     Time  4    Period  Weeks    Status  Achieved      PT SHORT TERM GOAL #3   Title  Pt will report atleast 25% improvement in her pain/symptoms from the start of therapy, to allow her to complete daily activity.     Time  4    Period  Weeks    Status  Achieved        PT Long Term Goals - 10/31/17 1024      PT LONG TERM GOAL #1   Title  Pt will demo improved BLE strength to atleast 4/5 MMT to improve her efficiency and safety with daily activity.     Baseline  5/5 MMT    Time  8    Period  Weeks    Status  Achieved      PT LONG TERM GOAL #2   Title  Pt will be able to get on/off the floor with no more than therapist cuing for hand/foot placement, 2/3 trials, which will increase her ability to interract with her grandchildren.     Time  8    Period  Weeks    Status  On-going      PT LONG TERM GOAL #3   Title  Pt will report atleast 50% reduction in her pain/symptoms from the start of therapy, to increase her quality of life and allow her to complete ADLs without need for assistance.     Time  8  Period  Weeks    Status  On-going      PT LONG TERM GOAL #4   Title  Pt will be able to ascend/descend 1 flight of stairs with no more than 2/10 pain report, to allow her to get  around her house without difficulty.    Time  8    Period  Weeks    Status  On-going      PT LONG TERM GOAL #5   Title  Pt will demo improved hip strength and stability evident by her ability to complete 5x sit to stand in less than 13 sec (without UE support) and without knee valgus deviation.     Time  8    Period  Weeks    Status  On-going            Plan - 11/07/17 0925    Clinical Impression Statement  Today's session focused on progressions of therex, introducing increased resistance with leg press. Pt does still demonstrate poor rotary stability of the Lt hip evident during higher level activity, but she is able to complete with therapist cuing. Pt did note Lt lateral hip discomfort with step up activity and therapist ended with manual treatment to the area. Pt responded well with reports of resolved pain by the end of the session. Will continue with therex progressions and complete reassessment in 2 sessions.    Rehab Potential  Good    PT Frequency  2x / week    PT Duration  8 weeks    PT Treatment/Interventions  ADLs/Self Care Home Management;Cryotherapy;Electrical Stimulation;Moist Heat;Traction;Iontophoresis 4mg /ml Dexamethasone;Gait training;Neuromuscular re-education;Stair training;Functional mobility training;Therapeutic activities;Therapeutic exercise;Patient/family education;Manual techniques;Taping;Dry needling;Passive range of motion    PT Next Visit Plan  continue with progression of functional/standing activity lunges, step ups, etc; soft tissue techniques as needed to decrease pain    PT Home Exercise Plan  standing hip ext (yellow), side stepping (red), dead bug, sit to stand     Consulted and Agree with Plan of Care  Patient       Patient will benefit from skilled therapeutic intervention in order to improve the following deficits and impairments:  Abnormal gait, Decreased activity tolerance, Difficulty walking, Impaired flexibility, Decreased strength, Decreased  range of motion, Increased muscle spasms, Postural dysfunction, Pain, Decreased mobility, Impaired sensation  Visit Diagnosis: Chronic left-sided low back pain, with sciatica presence unspecified  Muscle weakness (generalized)  Other muscle spasm  Pain in left hip     Problem List Patient Active Problem List   Diagnosis Date Noted  . Pelvic relaxation 02/21/2017  . 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 Dysphagia 03/01/2011  . Night sweats 02/20/2011  . Fatigue/Loss of Sleep 02/20/2011  . Mitral valve prolapse 02/20/2011  . FHx: allergies 02/20/2011  . Multiple thyroid nodules 02/20/2011  . Rash/bruises easily 02/20/2011  . Dizziness - light-headed/fainting 02/20/2011  . Generalized headaches 02/20/2011  . Retinitis pigmentosa 02/20/2011  . Sinus problem 02/20/2011  . Runny nose 02/20/2011  . Hoarseness/sore throat 02/20/2011  . Wears glasses/contacts 02/20/2011  . Arthritis 02/20/2011  . Family history of breast cancer 02/20/2011   9:51 AM,11/07/17 Sherol Dade PT, DPT [facility] [address] [place], [place], [address] Phone: [phone]   Fax:  [phone]  Name: Alyssa Fisher MRN: [id number] Date of Birth: 10-Oct-1965

## 2017-11-08 DIAGNOSIS — J301 Allergic rhinitis due to pollen: Secondary | ICD-10-CM | POA: Diagnosis not present

## 2017-11-12 ENCOUNTER — Encounter: Payer: Self-pay | Admitting: Physical Therapy

## 2017-11-12 ENCOUNTER — Ambulatory Visit: Payer: PPO | Admitting: Physical Therapy

## 2017-11-12 DIAGNOSIS — M545 Low back pain: Secondary | ICD-10-CM | POA: Diagnosis not present

## 2017-11-12 DIAGNOSIS — M25552 Pain in left hip: Secondary | ICD-10-CM

## 2017-11-12 DIAGNOSIS — M62838 Other muscle spasm: Secondary | ICD-10-CM

## 2017-11-12 DIAGNOSIS — G8929 Other chronic pain: Secondary | ICD-10-CM

## 2017-11-12 DIAGNOSIS — M6281 Muscle weakness (generalized): Secondary | ICD-10-CM

## 2017-11-12 NOTE — Therapy (Signed)
[facility] [address] [place], [place], [address] Phone: [phone]   Fax:  [phone]  Physical Therapy Treatment  Patient Details  Name: Alyssa Fisher MRN: [id number] Date of Birth: [birth year] Referring Provider: Rachell Cipro, MD    Encounter Date: 11/12/2017  PT End of Session - 11/12/17 1042    Visit Number  13    Number of Visits  16    Date for PT Re-Evaluation  11/14/17    Authorization Time Period  09/19/17 to 11/14/17    PT Start Time  1017    PT Stop Time  1057    PT Time Calculation (min)  40 min    Activity Tolerance  Patient tolerated treatment well;No increased pain    Behavior During Therapy  WFL for tasks assessed/performed       Past Medical History:  Diagnosis Date  . Anxiety   . Broken ankle   . Dysphagia 10-23-12   due to thyroid issues  . GERD (gastroesophageal reflux disease)   . History of tonsillectomy   . Hypothyroidism   . Miscarriage    One - date not on medical form.  . Mitral valve prolapse 10/23/2012  . 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 Rosacea   . Seasonal allergies    uses albuterol inhaler as needed for allergies  . SVD (spontaneous vaginal delivery)    x 5  . Thyroid goiter 10-23-12   multinodular- has been followed x 54yrs    Past Surgical History:  Procedure Laterality Date  . ANTERIOR AND POSTERIOR REPAIR N/A 02/21/2017   Procedure: ANTERIOR (CYSTOCELE) AND POSTERIOR REPAIR (RECTOCELE);  Surgeon: Cheri Fowler, MD;  Location: [facility];  Service: Gynecology;  Laterality: N/A;  . BILATERAL SALPINGECTOMY N/A 02/21/2017   Procedure: BILATERAL SALPINGECTOMY;  Surgeon: Cheri Fowler, MD;  Location: [facility];  Service: Gynecology;  Laterality: N/A;  . BLADDER SUSPENSION N/A 02/21/2017   Procedure: Solyx Sling;  Surgeon: Cheri Fowler, MD;  Location: [facility];  Service: Gynecology;  Laterality: N/A;  . broken ankle Right    Pins/rod  inserted-retained  . COLONOSCOPY    . DILATION AND CURETTAGE OF UTERUS     MAB  . THYROIDECTOMY N/A 10/29/2012   Procedure: THYROIDECTOMY;  Surgeon: Earnstine Regal, MD;  Location: [facility];  Service: General;  Laterality: N/A;  Total Thyroidectomy  . TONSILLECTOMY    . VAGINAL HYSTERECTOMY Bilateral 02/21/2017   Procedure: HYSTERECTOMY VAGINAL;  Surgeon: Cheri Fowler, MD;  Location: [facility];  Service: Gynecology;  Laterality: Bilateral;  . WISDOM TOOTH EXTRACTION      There were no vitals filed for this visit.  Subjective Assessment - 11/12/17 1019    Subjective  Pt reports that she was using the tennis ball     Pertinent History  Legally blind (very narrow vision)    Limitations  Other (comment) going up/down stairs    Pain Onset  More than a month ago              [facility] Adult PT Treatment/Exercise - 11/12/17 0001      Lumbar Exercises: Machines for Strengthening   Leg Press  seat 6, BLE 120# 2x10 reps, single leg x15 reps each 50#      Lumbar Exercises: Standing   Other Standing Lumbar Exercises  forward step up on 6" box 2x10 reps, 2nd set without UE support     Other Standing Lumbar Exercises  tandem  hold with each LE forward and BUE pressdown using yellow TB 3x10 sec hold each side; NBOS on foam with BUE rows using green TB x15 reps       Lumbar Exercises: Seated   Other Seated Lumbar Exercises  seated trunk rotation x15 reps each direction (double green TB)              PT Education - 11/12/17 1041    Education provided  Yes    Education Details  technique with therex    Person(s) Educated  Patient    Methods  Explanation;Verbal cues    Comprehension  Verbalized understanding;Returned demonstration       PT Short Term Goals - 10/31/17 1024      PT SHORT TERM GOAL #1   Title  Pt will demo consistency and independence with her HEP to decrease pain and improve LE strength.    Time  4    Period  Weeks    Status  Achieved      PT SHORT TERM GOAL #2    Title  Pt will demo proper placement and use of lumbar roll while seated, to improve posture and pain with sittitng throughout the day.     Time  4    Period  Weeks    Status  Achieved      PT SHORT TERM GOAL #3   Title  Pt will report atleast 25% improvement in her pain/symptoms from the start of therapy, to allow her to complete daily activity.     Time  4    Period  Weeks    Status  Achieved        PT Long Term Goals - 10/31/17 1024      PT LONG TERM GOAL #1   Title  Pt will demo improved BLE strength to atleast 4/5 MMT to improve her efficiency and safety with daily activity.     Baseline  5/5 MMT    Time  8    Period  Weeks    Status  Achieved      PT LONG TERM GOAL #2   Title  Pt will be able to get on/off the floor with no more than therapist cuing for hand/foot placement, 2/3 trials, which will increase her ability to interract with her grandchildren.     Time  8    Period  Weeks    Status  On-going      PT LONG TERM GOAL #3   Title  Pt will report atleast 50% reduction in her pain/symptoms from the start of therapy, to increase her quality of life and allow her to complete ADLs without need for assistance.     Time  8    Period  Weeks    Status  On-going      PT LONG TERM GOAL #4   Title  Pt will be able to ascend/descend 1 flight of stairs with no more than 2/10 pain report, to allow her to get around her house without difficulty.    Time  8    Period  Weeks    Status  On-going      PT LONG TERM GOAL #5   Title  Pt will demo improved hip strength and stability evident by her ability to complete 5x sit to stand in less than 13 sec (without UE support) and without knee valgus deviation.     Time  8    Period  Weeks    Status  On-going            Plan - 11/12/17 1044    Clinical Impression Statement  Pt arrived without pain today. Continued with therex progressions to improve functional LE strength and stability. She does require intermittent cues to  improve technique with single leg stability activity. Pt reports mild Lt hip discomfort following today's activity, and was encouraged to utilize tennis ball for self-massage at home. She verbalized understanding at this time. Will complete reassessment next session.     Rehab Potential  Good    PT Frequency  2x / week    PT Duration  8 weeks    PT Treatment/Interventions  ADLs/Self Care Home Management;Cryotherapy;Electrical Stimulation;Moist Heat;Traction;Iontophoresis 4mg /ml Dexamethasone;Gait training;Neuromuscular re-education;Stair training;Functional mobility training;Therapeutic activities;Therapeutic exercise;Patient/family education;Manual techniques;Taping;Dry needling;Passive range of motion    PT Next Visit Plan  reassessment (possible TFL needling)    PT Home Exercise Plan  standing hip ext (yellow), side stepping (red), dead bug, sit to stand     Consulted and Agree with Plan of Care  Patient       Patient will benefit from skilled therapeutic intervention in order to improve the following deficits and impairments:  Abnormal gait, Decreased activity tolerance, Difficulty walking, Impaired flexibility, Decreased strength, Decreased range of motion, Increased muscle spasms, Postural dysfunction, Pain, Decreased mobility, Impaired sensation  Visit Diagnosis: Chronic left-sided low back pain, with sciatica presence unspecified  Muscle weakness (generalized)  Other muscle spasm  Pain in left hip     Problem List Patient Active Problem List   Diagnosis Date Noted  . Pelvic relaxation 02/21/2017  . 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 Dysphagia 03/01/2011  . Night sweats 02/20/2011  . Fatigue/Loss of Sleep 02/20/2011  . Mitral valve prolapse 02/20/2011  . FHx: allergies 02/20/2011  . Multiple thyroid nodules 02/20/2011  . Rash/bruises easily 02/20/2011  . Dizziness - light-headed/fainting 02/20/2011  . Generalized headaches 02/20/2011  . Retinitis pigmentosa  02/20/2011  . Sinus problem 02/20/2011  . Runny nose 02/20/2011  . Hoarseness/sore throat 02/20/2011  . Wears glasses/contacts 02/20/2011  . Arthritis 02/20/2011  . Family history of breast cancer 02/20/2011    10:57 AM,11/12/17 Sherol Dade PT, DPT [place] [address] [place], [place], [address] Phone: [phone]   Fax:  [phone]  Name: ALETHEIA TANGREDI MRN: [id number] Date of Birth: [birth year]

## 2017-11-13 DIAGNOSIS — J301 Allergic rhinitis due to pollen: Secondary | ICD-10-CM | POA: Diagnosis not present

## 2017-11-14 ENCOUNTER — Ambulatory Visit: Payer: PPO | Admitting: Physical Therapy

## 2017-11-14 ENCOUNTER — Encounter: Payer: Self-pay | Admitting: Physical Therapy

## 2017-11-14 DIAGNOSIS — M62838 Other muscle spasm: Secondary | ICD-10-CM

## 2017-11-14 DIAGNOSIS — M545 Low back pain: Secondary | ICD-10-CM | POA: Diagnosis not present

## 2017-11-14 DIAGNOSIS — G8929 Other chronic pain: Secondary | ICD-10-CM

## 2017-11-14 DIAGNOSIS — M6281 Muscle weakness (generalized): Secondary | ICD-10-CM

## 2017-11-14 DIAGNOSIS — M25552 Pain in left hip: Secondary | ICD-10-CM

## 2017-11-14 NOTE — Therapy (Signed)
[facility] [address] [place], [place], [address] Phone: [phone]   Fax:  [phone]  Physical Therapy Treatment  Patient Details  Name: Alyssa Fisher MRN: [id number] Date of Birth: 02-14-1966 Referring Provider: Rachell Cipro, MD    Encounter Date: 11/14/2017  PT End of Session - 11/14/17 0908    Visit Number  13    Number of Visits  16    Date for PT Re-Evaluation  11/14/17    Authorization Time Period  09/19/17 to 11/14/17    PT Start Time  0836    PT Stop Time  0920    PT Time Calculation (min)  44 min    Activity Tolerance  Patient tolerated treatment well;No increased pain    Behavior During Therapy  WFL for tasks assessed/performed       Past Medical History:  Diagnosis Date  . Anxiety   . Broken ankle   . Dysphagia 10-23-12   due to thyroid issues  . GERD (gastroesophageal reflux disease)   . History of tonsillectomy   . Hypothyroidism   . Miscarriage    One - date not on medical form.  . Mitral valve prolapse 10/23/2012  . 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 Rosacea   . Seasonal allergies    uses albuterol inhaler as needed for allergies  . SVD (spontaneous vaginal delivery)    x 5  . Thyroid goiter 10-23-12   multinodular- has been followed x 30yr    Past Surgical History:  Procedure Laterality Date  . ANTERIOR AND POSTERIOR REPAIR N/A 02/21/2017   Procedure: ANTERIOR (CYSTOCELE) AND POSTERIOR REPAIR (RECTOCELE);  Surgeon: MCheri Fowler MD;  [facility];  Service: Gynecology;  Laterality: N/A;  . BILATERAL SALPINGECTOMY N/A 02/21/2017   Procedure: BILATERAL SALPINGECTOMY;  Surgeon: MCheri Fowler MD;  [facility];  Service: Gynecology;  Laterality: N/A;  . BLADDER SUSPENSION N/A 02/21/2017   Procedure: Solyx Sling;  Surgeon: MCheri Fowler MD;  [facility];  Service: Gynecology;  Laterality: N/A;  . broken ankle Right    Pins/rod  inserted-retained  . COLONOSCOPY    . DILATION AND CURETTAGE OF UTERUS     MAB  . THYROIDECTOMY N/A 10/29/2012   Procedure: THYROIDECTOMY;  Surgeon: TEarnstine Regal MD;  [facility];  Service: General;  Laterality: N/A;  Total Thyroidectomy  . TONSILLECTOMY    . VAGINAL HYSTERECTOMY Bilateral 02/21/2017   Procedure: HYSTERECTOMY VAGINAL;  Surgeon: MCheri Fowler MD;  [facility];  Service: Gynecology;  Laterality: Bilateral;  . WISDOM TOOTH EXTRACTION      There were no vitals filed for this visit.  Subjective Assessment - 11/14/17 0939    Subjective  Pt reports things are going well. She feels atleast 95% improvement in her pain and symptoms from the start of therapy.    Pertinent History  Legally blind (very narrow vision)    Limitations  Other (comment) going up/down stairs    Currently in Pain?  No/denies    Pain Onset  More than a month ago         [facility] - 11/14[facility]17 0001      Assessment   Medical Diagnosis  Lt hip pain     Referring Provider  ERachell Cipro MD     Onset Date/Surgical Date  -- 2011    Next MD Visit  not sure for this     Prior Therapy  OPPT maybe 4-5 years ago which didn't help the lower back, just the flexibility of the hip       Precautions   Precautions  Other (comment)    Precaution Comments  legally blind       Balance Screen   Has the patient fallen in the past 6 months  No    Has the patient had a decrease in activity level because of a fear of falling?   No    Is the patient reluctant to leave their home because of a fear of falling?   No      Prior Function   Level of Independence  Independent      Observation/Other Assessments   Observations  pt using guide stick to get back to treatment room     Focus on Therapeutic Outcomes (FOTO)   28% limited  goal 45% limited       Posture/Postural Control   Posture Comments  seated slouched, forward head, rounded shoulders, decreased lumbar lordosis       AROM   Lumbar  Flexion  WNL, pain free    Lumbar Extension  WNL, pain free      Strength   Right Hip Extension  5/5    Right Hip ABduction  5/5 5    Left Hip Flexion  5/5    Left Hip Extension  5/5    Left Hip ABduction  5/5    Right Knee Flexion  5/5    Right Knee Extension  5/5    Left Knee Flexion  5/5    Left Knee Extension  5/5    Right Ankle Dorsiflexion  5/5    Left Ankle Dorsiflexion  5/5      Palpation   Palpation comment  tenderness along Lt gluteals, Lt ITB/TFL, Lt lateral qudariceps      Transfers   Five time sit to stand comments   10 sec, without knee valgus                   [facility] Adult PT Treatment/Exercise - 11/14/17 0001      Ambulation/Gait   Pre-Gait Activities  ascend and descend steps x4 reps without knee valgus and increased Lt hip pain to 1/10 pain only.              PT Education - 11/14/17 0931    Education provided  Yes    Education Details  goals and progress since start of PT; importance of gradually increasing jogging activity and discussed ways to progress this safely; set up and use of recumbent bike for aerobic activity    Person(s) Educated  Patient    Methods  Explanation;Verbal cues    Comprehension  Verbalized understanding;Returned demonstration       PT Short Term Goals - 11/14/17 0901      PT SHORT TERM GOAL #1   Title  Pt will demo consistency and independence with her HEP to decrease pain and improve LE strength.    Time  4    Period  Weeks    Status  Achieved      PT SHORT TERM GOAL #2   Title  Pt will demo proper placement and use of lumbar roll while seated, to improve posture and pain with sittitng throughout the day.     Time  4    Period  Weeks    Status  Achieved      PT SHORT TERM GOAL #3   Title  Pt will report atleast 25% improvement in her pain/symptoms from the start of therapy, to allow her to complete daily activity.     Time  4    Period  Weeks    Status  Achieved        PT Long Term Goals -  11/14/17 0902      PT LONG TERM GOAL #1   Title  Pt will demo improved BLE strength to atleast 4/5 MMT to improve her efficiency and safety with daily activity.     Baseline  5/5 MMT    Time  8    Period  Weeks    Status  Achieved      PT LONG TERM GOAL #2   Title  Pt will be able to get on/off the floor with no more than therapist cuing for hand/foot placement, 2/3 trials, which will increase her ability to interract with her grandchildren.     Baseline  no difficulty with this     Time  8    Period  Weeks    Status  Achieved      PT LONG TERM GOAL #3   Title  Pt will report atleast 50% reduction in her pain/symptoms from the start of therapy, to increase her quality of life and allow her to complete ADLs without need for assistance.     Baseline  95% improved     Time  8    Period  Weeks    Status  Achieved      PT LONG TERM GOAL #4   Title  Pt will be able to ascend/descend 1 flight of stairs with no more than 2/10 pain report, to allow her to get around her house without difficulty.    Baseline  1/10 discomfort Lt hip    Time  8    Period  Weeks    Status  Achieved      PT LONG TERM GOAL #5   Title  Pt will demo improved hip strength and stability evident by her ability to complete 5x sit to stand in less than 13 sec (without UE support) and without knee valgus deviation.     Baseline  10 sec, without knee valgus     Time  8    Period  Weeks    Status  Achieved            Plan - 11/14/17 5102    Clinical Impression Statement  Pt was discharged this visit having met all of her short and long term goals since beginning PT. She feels 95% improved, and no longer has symptoms down to the Lt foot. She does occasionally note mild ache in the Lt hip with higher level activity such as jogging or alot of stair activity, however this resolves quickly. She has 5/5 strength in the LEs, pain free active lumbar ROM and has been able to get on/off the floor without any difficulty  when playing with her grandchildren. She is pleased with her progress and is comfortable with self-progression of therex moving forward at home and in her local gym. PT will sign off.     Rehab Potential  Good    PT Frequency  2x / week    PT Duration  8 weeks    PT Treatment/Interventions  ADLs/Self Care Home Management;Cryotherapy;Electrical Stimulation;Moist Heat;Traction;Iontophoresis 52m/ml Dexamethasone;Gait training;Neuromuscular re-education;Stair training;Functional mobility training;Therapeutic activities;Therapeutic exercise;Patient/family education;Manual techniques;Taping;Dry needling;Passive range of motion    PT Next Visit Plan  d/c home with  HEP    PT Home Exercise Plan  standing hip ext (yellow), side stepping (red) around feet, dead bug, sit to stand     Consulted and Agree with Plan of Care  Patient       Patient will benefit from skilled therapeutic intervention in order to improve the following deficits and impairments:  Abnormal gait, Decreased activity tolerance, Difficulty walking, Impaired flexibility, Decreased strength, Decreased range of motion, Increased muscle spasms, Postural dysfunction, Pain, Decreased mobility, Impaired sensation  Visit Diagnosis: Chronic left-sided low back pain, with sciatica presence unspecified  Muscle weakness (generalized)  Other muscle spasm  Pain in left hip     Problem List Patient Active Problem List   Diagnosis Date Noted  . Pelvic relaxation 02/21/2017  . 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 Dysphagia 03/01/2011  . Night sweats 02/20/2011  . Fatigue/Loss of Sleep 02/20/2011  . Mitral valve prolapse 02/20/2011  . FHx: allergies 02/20/2011  . Multiple thyroid nodules 02/20/2011  . Rash/bruises easily 02/20/2011  . Dizziness - light-headed/fainting 02/20/2011  . Generalized headaches 02/20/2011  . Retinitis pigmentosa 02/20/2011  . Sinus problem 02/20/2011  . Runny nose 02/20/2011  . Hoarseness/sore throat  02/20/2011  . Wears glasses/contacts 02/20/2011  . Arthritis 02/20/2011  . Family history of breast cancer 02/20/2011    PHYSICAL THERAPY DISCHARGE SUMMARY  Visits from Start of Care: 14  Current functional level related to goals / functional outcomes: See above for more details    Remaining deficits: See above for more details    Education / Equipment: See above for more details  Plan: Patient agrees to discharge.  Patient goals were met. Patient is being discharged due to meeting the stated rehab goals.  ?????       9:39 AM,11/14/17 [place] [place] [facility] [place]  [facility] [address], [place], [place], [address] Phone: [phone]   Fax:  [phone]  Name: JASMYNN PFALZGRAF MRN: [id number] Date of Birth: 04/16/1966

## 2017-11-16 DIAGNOSIS — J301 Allergic rhinitis due to pollen: Secondary | ICD-10-CM | POA: Diagnosis not present

## 2017-11-19 DIAGNOSIS — J301 Allergic rhinitis due to pollen: Secondary | ICD-10-CM | POA: Diagnosis not present

## 2017-11-27 DIAGNOSIS — J301 Allergic rhinitis due to pollen: Secondary | ICD-10-CM | POA: Diagnosis not present

## 2017-12-05 DIAGNOSIS — J301 Allergic rhinitis due to pollen: Secondary | ICD-10-CM | POA: Diagnosis not present

## 2017-12-12 DIAGNOSIS — J301 Allergic rhinitis due to pollen: Secondary | ICD-10-CM | POA: Diagnosis not present

## 2017-12-14 DIAGNOSIS — J301 Allergic rhinitis due to pollen: Secondary | ICD-10-CM | POA: Diagnosis not present

## 2017-12-17 DIAGNOSIS — J301 Allergic rhinitis due to pollen: Secondary | ICD-10-CM | POA: Diagnosis not present

## 2017-12-19 DIAGNOSIS — J301 Allergic rhinitis due to pollen: Secondary | ICD-10-CM | POA: Diagnosis not present

## 2017-12-28 DIAGNOSIS — J301 Allergic rhinitis due to pollen: Secondary | ICD-10-CM | POA: Diagnosis not present

## 2017-12-31 DIAGNOSIS — E039 Hypothyroidism, unspecified: Secondary | ICD-10-CM | POA: Diagnosis not present

## 2018-01-02 DIAGNOSIS — J3089 Other allergic rhinitis: Secondary | ICD-10-CM | POA: Diagnosis not present

## 2018-01-02 DIAGNOSIS — E039 Hypothyroidism, unspecified: Secondary | ICD-10-CM | POA: Diagnosis not present

## 2018-01-02 DIAGNOSIS — B373 Candidiasis of vulva and vagina: Secondary | ICD-10-CM | POA: Diagnosis not present

## 2018-01-02 DIAGNOSIS — I10 Essential (primary) hypertension: Secondary | ICD-10-CM | POA: Diagnosis not present

## 2018-01-03 DIAGNOSIS — J301 Allergic rhinitis due to pollen: Secondary | ICD-10-CM | POA: Diagnosis not present

## 2018-01-03 DIAGNOSIS — J3089 Other allergic rhinitis: Secondary | ICD-10-CM | POA: Diagnosis not present

## 2018-01-07 DIAGNOSIS — J301 Allergic rhinitis due to pollen: Secondary | ICD-10-CM | POA: Diagnosis not present

## 2018-01-10 DIAGNOSIS — Z6827 Body mass index (BMI) 27.0-27.9, adult: Secondary | ICD-10-CM | POA: Diagnosis not present

## 2018-01-10 DIAGNOSIS — Z1231 Encounter for screening mammogram for malignant neoplasm of breast: Secondary | ICD-10-CM | POA: Diagnosis not present

## 2018-01-10 DIAGNOSIS — Z1389 Encounter for screening for other disorder: Secondary | ICD-10-CM | POA: Diagnosis not present

## 2018-01-10 DIAGNOSIS — N952 Postmenopausal atrophic vaginitis: Secondary | ICD-10-CM | POA: Diagnosis not present

## 2018-01-10 DIAGNOSIS — Z01419 Encounter for gynecological examination (general) (routine) without abnormal findings: Secondary | ICD-10-CM | POA: Diagnosis not present

## 2018-01-10 DIAGNOSIS — Z130 Encounter for screening for diseases of the blood and blood-forming organs and certain disorders involving the immune mechanism: Secondary | ICD-10-CM | POA: Diagnosis not present

## 2018-01-10 DIAGNOSIS — N898 Other specified noninflammatory disorders of vagina: Secondary | ICD-10-CM | POA: Diagnosis not present

## 2018-01-14 DIAGNOSIS — J301 Allergic rhinitis due to pollen: Secondary | ICD-10-CM | POA: Diagnosis not present

## 2018-01-21 DIAGNOSIS — J301 Allergic rhinitis due to pollen: Secondary | ICD-10-CM | POA: Diagnosis not present

## 2018-01-31 DIAGNOSIS — J301 Allergic rhinitis due to pollen: Secondary | ICD-10-CM | POA: Diagnosis not present

## 2018-02-07 DIAGNOSIS — J301 Allergic rhinitis due to pollen: Secondary | ICD-10-CM | POA: Diagnosis not present

## 2018-02-11 DIAGNOSIS — J301 Allergic rhinitis due to pollen: Secondary | ICD-10-CM | POA: Diagnosis not present

## 2018-02-13 DIAGNOSIS — E039 Hypothyroidism, unspecified: Secondary | ICD-10-CM | POA: Diagnosis not present

## 2018-02-13 DIAGNOSIS — R5383 Other fatigue: Secondary | ICD-10-CM | POA: Diagnosis not present

## 2018-02-13 DIAGNOSIS — I10 Essential (primary) hypertension: Secondary | ICD-10-CM | POA: Diagnosis not present

## 2018-02-20 DIAGNOSIS — Z6825 Body mass index (BMI) 25.0-25.9, adult: Secondary | ICD-10-CM | POA: Diagnosis not present

## 2018-02-20 DIAGNOSIS — I10 Essential (primary) hypertension: Secondary | ICD-10-CM | POA: Diagnosis not present

## 2018-02-20 DIAGNOSIS — E559 Vitamin D deficiency, unspecified: Secondary | ICD-10-CM | POA: Diagnosis not present

## 2018-02-20 DIAGNOSIS — E039 Hypothyroidism, unspecified: Secondary | ICD-10-CM | POA: Diagnosis not present

## 2018-02-26 DIAGNOSIS — J301 Allergic rhinitis due to pollen: Secondary | ICD-10-CM | POA: Diagnosis not present

## 2018-03-05 DIAGNOSIS — J301 Allergic rhinitis due to pollen: Secondary | ICD-10-CM | POA: Diagnosis not present

## 2018-03-22 DIAGNOSIS — J301 Allergic rhinitis due to pollen: Secondary | ICD-10-CM | POA: Diagnosis not present

## 2018-04-16 DIAGNOSIS — E559 Vitamin D deficiency, unspecified: Secondary | ICD-10-CM | POA: Diagnosis not present

## 2018-04-16 DIAGNOSIS — E039 Hypothyroidism, unspecified: Secondary | ICD-10-CM | POA: Diagnosis not present

## 2018-04-16 DIAGNOSIS — R5383 Other fatigue: Secondary | ICD-10-CM | POA: Diagnosis not present

## 2018-04-18 DIAGNOSIS — J309 Allergic rhinitis, unspecified: Secondary | ICD-10-CM | POA: Diagnosis not present

## 2018-04-18 DIAGNOSIS — E559 Vitamin D deficiency, unspecified: Secondary | ICD-10-CM | POA: Diagnosis not present

## 2018-04-18 DIAGNOSIS — Z23 Encounter for immunization: Secondary | ICD-10-CM | POA: Diagnosis not present

## 2018-04-18 DIAGNOSIS — Z6825 Body mass index (BMI) 25.0-25.9, adult: Secondary | ICD-10-CM | POA: Diagnosis not present

## 2018-04-18 DIAGNOSIS — E039 Hypothyroidism, unspecified: Secondary | ICD-10-CM | POA: Diagnosis not present

## 2018-06-17 DIAGNOSIS — E039 Hypothyroidism, unspecified: Secondary | ICD-10-CM | POA: Diagnosis not present

## 2018-06-17 DIAGNOSIS — Z118 Encounter for screening for other infectious and parasitic diseases: Secondary | ICD-10-CM | POA: Diagnosis not present

## 2018-06-17 DIAGNOSIS — N760 Acute vaginitis: Secondary | ICD-10-CM | POA: Diagnosis not present

## 2018-06-17 DIAGNOSIS — R300 Dysuria: Secondary | ICD-10-CM | POA: Diagnosis not present

## 2018-06-28 DIAGNOSIS — R300 Dysuria: Secondary | ICD-10-CM | POA: Diagnosis not present

## 2018-06-28 DIAGNOSIS — E039 Hypothyroidism, unspecified: Secondary | ICD-10-CM | POA: Diagnosis not present

## 2018-06-28 DIAGNOSIS — Z6825 Body mass index (BMI) 25.0-25.9, adult: Secondary | ICD-10-CM | POA: Diagnosis not present

## 2018-06-28 DIAGNOSIS — N760 Acute vaginitis: Secondary | ICD-10-CM | POA: Diagnosis not present

## 2018-07-02 DIAGNOSIS — Z6825 Body mass index (BMI) 25.0-25.9, adult: Secondary | ICD-10-CM | POA: Diagnosis not present

## 2018-07-02 DIAGNOSIS — B079 Viral wart, unspecified: Secondary | ICD-10-CM | POA: Diagnosis not present

## 2018-07-02 DIAGNOSIS — B078 Other viral warts: Secondary | ICD-10-CM | POA: Diagnosis not present

## 2018-07-23 DIAGNOSIS — Z6825 Body mass index (BMI) 25.0-25.9, adult: Secondary | ICD-10-CM | POA: Diagnosis not present

## 2018-07-23 DIAGNOSIS — J069 Acute upper respiratory infection, unspecified: Secondary | ICD-10-CM | POA: Diagnosis not present

## 2018-07-23 DIAGNOSIS — B079 Viral wart, unspecified: Secondary | ICD-10-CM | POA: Diagnosis not present

## 2018-08-05 DIAGNOSIS — F411 Generalized anxiety disorder: Secondary | ICD-10-CM | POA: Diagnosis not present

## 2018-08-05 DIAGNOSIS — B078 Other viral warts: Secondary | ICD-10-CM | POA: Diagnosis not present
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
## 2018-09-11 DIAGNOSIS — Z6825 Body mass index (BMI) 25.0-25.9, adult: Secondary | ICD-10-CM | POA: Diagnosis not present

## 2018-09-11 DIAGNOSIS — Z0000 Encounter for general adult medical examination without abnormal findings: Secondary | ICD-10-CM | POA: Diagnosis not present

## 2018-09-11 DIAGNOSIS — M25552 Pain in left hip: Secondary | ICD-10-CM | POA: Diagnosis not present

## 2018-09-11 DIAGNOSIS — Z1211 Encounter for screening for malignant neoplasm of colon: Secondary | ICD-10-CM | POA: Diagnosis not present

## 2018-09-11 DIAGNOSIS — F411 Generalized anxiety disorder: Secondary | ICD-10-CM | POA: Diagnosis not present

## 2018-09-17 DIAGNOSIS — Z6825 Body mass index (BMI) 25.0-25.9, adult: Secondary | ICD-10-CM | POA: Diagnosis not present

## 2018-09-17 DIAGNOSIS — B078 Other viral warts: Secondary | ICD-10-CM | POA: Diagnosis not present

## 2018-09-17 DIAGNOSIS — T148XXA Other injury of unspecified body region, initial encounter: Secondary | ICD-10-CM | POA: Diagnosis not present

## 2018-10-14 DIAGNOSIS — R5383 Other fatigue: Secondary | ICD-10-CM | POA: Diagnosis not present

## 2018-10-14 DIAGNOSIS — E559 Vitamin D deficiency, unspecified: Secondary | ICD-10-CM | POA: Diagnosis not present

## 2018-10-14 DIAGNOSIS — E039 Hypothyroidism, unspecified: Secondary | ICD-10-CM | POA: Diagnosis not present

## 2018-10-14 DIAGNOSIS — F411 Generalized anxiety disorder: Secondary | ICD-10-CM | POA: Diagnosis not present

## 2018-10-14 DIAGNOSIS — Z6826 Body mass index (BMI) 26.0-26.9, adult: Secondary | ICD-10-CM | POA: Diagnosis not present

## 2018-10-29 DIAGNOSIS — E039 Hypothyroidism, unspecified: Secondary | ICD-10-CM | POA: Diagnosis not present

## 2018-10-29 DIAGNOSIS — Z6830 Body mass index (BMI) 30.0-30.9, adult: Secondary | ICD-10-CM | POA: Diagnosis not present

## 2018-10-29 DIAGNOSIS — B079 Viral wart, unspecified: Secondary | ICD-10-CM | POA: Diagnosis not present

## 2018-10-29 DIAGNOSIS — B078 Other viral warts: Secondary | ICD-10-CM | POA: Diagnosis not present

## 2018-10-29 DIAGNOSIS — F331 Major depressive disorder, recurrent, moderate: Secondary | ICD-10-CM | POA: Diagnosis not present

## 2018-12-13 DIAGNOSIS — R21 Rash and other nonspecific skin eruption: Secondary | ICD-10-CM | POA: Diagnosis not present

## 2018-12-13 DIAGNOSIS — M7989 Other specified soft tissue disorders: Secondary | ICD-10-CM | POA: Diagnosis not present

## 2018-12-13 DIAGNOSIS — S82401A Unspecified fracture of shaft of right fibula, initial encounter for closed fracture: Secondary | ICD-10-CM | POA: Diagnosis not present

## 2018-12-13 DIAGNOSIS — E119 Type 2 diabetes mellitus without complications: Secondary | ICD-10-CM | POA: Diagnosis not present

## 2018-12-13 DIAGNOSIS — R300 Dysuria: Secondary | ICD-10-CM | POA: Diagnosis not present

## 2018-12-13 DIAGNOSIS — R270 Ataxia, unspecified: Secondary | ICD-10-CM | POA: Diagnosis not present

## 2018-12-13 DIAGNOSIS — M79609 Pain in unspecified limb: Secondary | ICD-10-CM | POA: Diagnosis not present

## 2018-12-13 DIAGNOSIS — S8254XA Nondisplaced fracture of medial malleolus of right tibia, initial encounter for closed fracture: Secondary | ICD-10-CM | POA: Diagnosis not present

## 2018-12-13 DIAGNOSIS — R350 Frequency of micturition: Secondary | ICD-10-CM | POA: Diagnosis not present

## 2018-12-18 ENCOUNTER — Other Ambulatory Visit (HOSPITAL_COMMUNITY): Payer: Self-pay | Admitting: Orthopedic Surgery

## 2018-12-18 ENCOUNTER — Encounter (HOSPITAL_BASED_OUTPATIENT_CLINIC_OR_DEPARTMENT_OTHER): Payer: Self-pay | Admitting: *Deleted

## 2018-12-18 ENCOUNTER — Other Ambulatory Visit: Payer: Self-pay

## 2018-12-18 DIAGNOSIS — S82841A Displaced bimalleolar fracture of right lower leg, initial encounter for closed fracture: Secondary | ICD-10-CM | POA: Diagnosis not present

## 2018-12-18 DIAGNOSIS — Z978 Presence of other specified devices: Secondary | ICD-10-CM | POA: Diagnosis not present

## 2018-12-18 DIAGNOSIS — M25571 Pain in right ankle and joints of right foot: Secondary | ICD-10-CM | POA: Diagnosis not present

## 2018-12-18 DIAGNOSIS — M545 Low back pain: Secondary | ICD-10-CM | POA: Diagnosis not present

## 2018-12-18 DIAGNOSIS — N201 Calculus of ureter: Secondary | ICD-10-CM | POA: Diagnosis not present

## 2018-12-19 ENCOUNTER — Encounter (HOSPITAL_BASED_OUTPATIENT_CLINIC_OR_DEPARTMENT_OTHER)
Admission: RE | Disposition: A | Discharge status: Home / Self Care | Payer: Self-pay | Source: Home / Self Care | Attending: Orthopedic Surgery

## 2018-12-19 ENCOUNTER — Other Ambulatory Visit: Payer: Self-pay

## 2018-12-19 ENCOUNTER — Ambulatory Visit (HOSPITAL_BASED_OUTPATIENT_CLINIC_OR_DEPARTMENT_OTHER): Payer: PPO | Admitting: Anesthesiology

## 2018-12-19 ENCOUNTER — Ambulatory Visit (HOSPITAL_BASED_OUTPATIENT_CLINIC_OR_DEPARTMENT_OTHER)
Admission: RE | Admit: 2018-12-19 | Discharge: 2018-12-19 | Disposition: A | Discharge status: Home / Self Care | Payer: PPO | Attending: Orthopedic Surgery | Admitting: Orthopedic Surgery

## 2018-12-19 ENCOUNTER — Encounter (HOSPITAL_BASED_OUTPATIENT_CLINIC_OR_DEPARTMENT_OTHER): Payer: Self-pay | Admitting: *Deleted

## 2018-12-19 DIAGNOSIS — I341 Nonrheumatic mitral (valve) prolapse: Secondary | ICD-10-CM | POA: Diagnosis not present

## 2018-12-19 DIAGNOSIS — Z8249 Family history of ischemic heart disease and other diseases of the circulatory system: Secondary | ICD-10-CM | POA: Insufficient documentation

## 2018-12-19 DIAGNOSIS — F419 Anxiety disorder, unspecified: Secondary | ICD-10-CM | POA: Diagnosis not present

## 2018-12-19 DIAGNOSIS — H3552 Pigmentary retinal dystrophy: Secondary | ICD-10-CM | POA: Diagnosis not present

## 2018-12-19 DIAGNOSIS — K219 Gastro-esophageal reflux disease without esophagitis: Secondary | ICD-10-CM | POA: Insufficient documentation

## 2018-12-19 DIAGNOSIS — S93431A Sprain of tibiofibular ligament of right ankle, initial encounter: Secondary | ICD-10-CM | POA: Diagnosis not present

## 2018-12-19 DIAGNOSIS — Z79899 Other long term (current) drug therapy: Secondary | ICD-10-CM | POA: Insufficient documentation

## 2018-12-19 DIAGNOSIS — S82841A Displaced bimalleolar fracture of right lower leg, initial encounter for closed fracture: Secondary | ICD-10-CM | POA: Diagnosis not present

## 2018-12-19 DIAGNOSIS — S82842A Displaced bimalleolar fracture of left lower leg, initial encounter for closed fracture: Secondary | ICD-10-CM | POA: Diagnosis not present

## 2018-12-19 DIAGNOSIS — I10 Essential (primary) hypertension: Secondary | ICD-10-CM | POA: Insufficient documentation

## 2018-12-19 DIAGNOSIS — Z885 Allergy status to narcotic agent status: Secondary | ICD-10-CM | POA: Diagnosis not present

## 2018-12-19 DIAGNOSIS — F329 Major depressive disorder, single episode, unspecified: Secondary | ICD-10-CM | POA: Diagnosis not present

## 2018-12-19 DIAGNOSIS — L719 Rosacea, unspecified: Secondary | ICD-10-CM | POA: Insufficient documentation

## 2018-12-19 DIAGNOSIS — G8918 Other acute postprocedural pain: Secondary | ICD-10-CM | POA: Diagnosis not present

## 2018-12-19 DIAGNOSIS — S9305XA Dislocation of left ankle joint, initial encounter: Secondary | ICD-10-CM | POA: Diagnosis not present

## 2018-12-19 DIAGNOSIS — S82841D Displaced bimalleolar fracture of right lower leg, subsequent encounter for closed fracture with routine healing: Secondary | ICD-10-CM | POA: Diagnosis not present

## 2018-12-19 DIAGNOSIS — E890 Postprocedural hypothyroidism: Secondary | ICD-10-CM | POA: Insufficient documentation

## 2018-12-19 DIAGNOSIS — Z90710 Acquired absence of both cervix and uterus: Secondary | ICD-10-CM | POA: Diagnosis not present

## 2018-12-19 HISTORY — PX: ORIF ANKLE FRACTURE: SHX5408

## 2018-12-19 HISTORY — DX: Essential (primary) hypertension: I10

## 2018-12-19 HISTORY — DX: Depression, unspecified: F32.A

## 2018-12-19 HISTORY — DX: Major depressive disorder, single episode, unspecified: F32.9

## 2018-12-19 SURGERY — OPEN REDUCTION INTERNAL FIXATION (ORIF) ANKLE FRACTURE
Anesthesia: General | Laterality: Right

## 2018-12-19 MED ORDER — EPHEDRINE SULFATE 50 MG/ML IJ SOLN
INTRAMUSCULAR | Status: DC | PRN
  Administered 2018-12-19 (×2): 10 mg via INTRAVENOUS

## 2018-12-19 MED ORDER — LIDOCAINE HCL (CARDIAC) PF 100 MG/5ML IV SOSY
PREFILLED_SYRINGE | INTRAVENOUS | Status: DC | PRN
  Administered 2018-12-19: 100 mg via INTRAVENOUS

## 2018-12-19 MED ORDER — ONDANSETRON HCL 4 MG/2ML IJ SOLN
INTRAMUSCULAR | Status: AC
  Filled 2018-12-19: qty 2

## 2018-12-19 MED ORDER — ASPIRIN EC 81 MG PO TBEC: 81.0000 mg | DELAYED_RELEASE_TABLET | Freq: Two times a day (BID) | ORAL | 0 refills | Status: DC

## 2018-12-19 MED ORDER — DEXAMETHASONE SODIUM PHOSPHATE 10 MG/ML IJ SOLN
INTRAMUSCULAR | Status: DC | PRN
  Administered 2018-12-19: 10 mg via INTRAVENOUS

## 2018-12-19 MED ORDER — MIDAZOLAM HCL 2 MG/2ML IJ SOLN
INTRAMUSCULAR | Status: AC
  Filled 2018-12-19: qty 2

## 2018-12-19 MED ORDER — LIDOCAINE 2% (20 MG/ML) 5 ML SYRINGE
INTRAMUSCULAR | Status: AC
  Filled 2018-12-19: qty 5

## 2018-12-19 MED ORDER — KETOROLAC TROMETHAMINE 30 MG/ML IJ SOLN
30.0000 mg | Freq: Once | INTRAMUSCULAR | Status: AC | PRN
  Administered 2018-12-19: 30 mg via INTRAVENOUS

## 2018-12-19 MED ORDER — DEXAMETHASONE SODIUM PHOSPHATE 10 MG/ML IJ SOLN
INTRAMUSCULAR | Status: AC
  Filled 2018-12-19: qty 1

## 2018-12-19 MED ORDER — FENTANYL CITRATE (PF) 100 MCG/2ML IJ SOLN
50.0000 ug | INTRAMUSCULAR | Status: AC | PRN
  Administered 2018-12-19 (×2): 25 ug via INTRAVENOUS
  Administered 2018-12-19: 07:00:00 50 ug via INTRAVENOUS
  Administered 2018-12-19 (×2): 25 ug via INTRAVENOUS

## 2018-12-19 MED ORDER — SODIUM CHLORIDE 0.9 % IV SOLN: INTRAVENOUS | Status: DC

## 2018-12-19 MED ORDER — HYDROMORPHONE HCL 1 MG/ML IJ SOLN
INTRAMUSCULAR | Status: AC
  Filled 2018-12-19: qty 0.5

## 2018-12-19 MED ORDER — PROMETHAZINE HCL 25 MG/ML IJ SOLN: 6.2500 mg | INTRAMUSCULAR | Status: DC | PRN

## 2018-12-19 MED ORDER — MIDAZOLAM HCL 2 MG/2ML IJ SOLN
1.0000 mg | INTRAMUSCULAR | Status: DC | PRN
  Administered 2018-12-19: 1 mg via INTRAVENOUS

## 2018-12-19 MED ORDER — ONDANSETRON HCL 4 MG/2ML IJ SOLN
INTRAMUSCULAR | Status: DC | PRN
  Administered 2018-12-19: 4 mg via INTRAVENOUS

## 2018-12-19 MED ORDER — PROPOFOL 10 MG/ML IV BOLUS
INTRAVENOUS | Status: DC | PRN
  Administered 2018-12-19: 200 mg via INTRAVENOUS

## 2018-12-19 MED ORDER — LIDOCAINE-EPINEPHRINE (PF) 1.5 %-1:200000 IJ SOLN
INTRAMUSCULAR | Status: DC | PRN
  Administered 2018-12-19: 15 mL via PERINEURAL

## 2018-12-19 MED ORDER — DOCUSATE SODIUM 100 MG PO CAPS: 100.0000 mg | ORAL_CAPSULE | Freq: Two times a day (BID) | ORAL | 0 refills | Status: DC

## 2018-12-19 MED ORDER — BUPIVACAINE HCL (PF) 0.5 % IJ SOLN
INTRAMUSCULAR | Status: DC | PRN
  Administered 2018-12-19: 25 mL via PERINEURAL

## 2018-12-19 MED ORDER — SCOPOLAMINE 1 MG/3DAYS TD PT72: 1.0000 | MEDICATED_PATCH | Freq: Once | TRANSDERMAL | Status: DC | PRN

## 2018-12-19 MED ORDER — OXYCODONE HCL 5 MG PO TABS: 5.0000 mg | ORAL_TABLET | ORAL | 0 refills | Status: AC | PRN

## 2018-12-19 MED ORDER — HYDROMORPHONE HCL 1 MG/ML IJ SOLN
0.2500 mg | INTRAMUSCULAR | Status: DC | PRN
  Administered 2018-12-19 (×2): 0.5 mg via INTRAVENOUS

## 2018-12-19 MED ORDER — PROPOFOL 500 MG/50ML IV EMUL
INTRAVENOUS | Status: AC
  Filled 2018-12-19: qty 50

## 2018-12-19 MED ORDER — SENNA 8.6 MG PO TABS: 2.0000 | ORAL_TABLET | Freq: Two times a day (BID) | ORAL | 0 refills | Status: DC

## 2018-12-19 MED ORDER — CEFAZOLIN SODIUM-DEXTROSE 2-4 GM/100ML-% IV SOLN
INTRAVENOUS | Status: AC
  Filled 2018-12-19: qty 100

## 2018-12-19 MED ORDER — CHLORHEXIDINE GLUCONATE 4 % EX LIQD: 60.0000 mL | Freq: Once | CUTANEOUS | Status: DC

## 2018-12-19 MED ORDER — FENTANYL CITRATE (PF) 100 MCG/2ML IJ SOLN
INTRAMUSCULAR | Status: AC
  Filled 2018-12-19: qty 2

## 2018-12-19 MED ORDER — CEFAZOLIN SODIUM-DEXTROSE 2-4 GM/100ML-% IV SOLN
2.0000 g | INTRAVENOUS | Status: AC
  Administered 2018-12-19: 2 g via INTRAVENOUS

## 2018-12-19 MED ORDER — LACTATED RINGERS IV SOLN
INTRAVENOUS | Status: DC
  Administered 2018-12-19 (×2): via INTRAVENOUS

## 2018-12-19 MED ORDER — KETOROLAC TROMETHAMINE 30 MG/ML IJ SOLN
INTRAMUSCULAR | Status: AC
  Filled 2018-12-19: qty 1

## 2018-12-19 SURGICAL SUPPLY — 76 items
BANDAGE ESMARK 6X9 LF (GAUZE/BANDAGES/DRESSINGS) ×1 IMPLANT
BIT DRILL 2.5X2.75 QC CALB (BIT) ×2 IMPLANT
BIT DRILL 2.9 CANN QC NONSTRL (BIT) ×2 IMPLANT
BLADE SURG 15 STRL LF DISP TIS (BLADE) ×2 IMPLANT
BLADE SURG 15 STRL SS (BLADE) ×9
BNDG COHESIVE 4X5 TAN STRL (GAUZE/BANDAGES/DRESSINGS) ×3 IMPLANT
BNDG COHESIVE 6X5 TAN STRL LF (GAUZE/BANDAGES/DRESSINGS) ×3 IMPLANT
BNDG ESMARK 4X9 LF (GAUZE/BANDAGES/DRESSINGS) IMPLANT
BNDG ESMARK 6X9 LF (GAUZE/BANDAGES/DRESSINGS)
CANISTER SUCT 1200ML W/VALVE (MISCELLANEOUS) ×3 IMPLANT
CHLORAPREP W/TINT 26 (MISCELLANEOUS) ×3 IMPLANT
COVER BACK TABLE REUSABLE LG (DRAPES) ×3 IMPLANT
COVER WAND RF STERILE (DRAPES) IMPLANT
CUFF TOURN SGL QUICK 34 (TOURNIQUET CUFF) ×2
CUFF TRNQT CYL 34X4.125X (TOURNIQUET CUFF) IMPLANT
DECANTER SPIKE VIAL GLASS SM (MISCELLANEOUS) IMPLANT
DRAPE EXTREMITY T 121X128X90 (DISPOSABLE) ×3 IMPLANT
DRAPE OEC MINIVIEW 54X84 (DRAPES) ×3 IMPLANT
DRAPE U-SHAPE 47X51 STRL (DRAPES) ×3 IMPLANT
DRSG MEPITEL 4X7.2 (GAUZE/BANDAGES/DRESSINGS) ×5 IMPLANT
DRSG PAD ABDOMINAL 8X10 ST (GAUZE/BANDAGES/DRESSINGS) ×6 IMPLANT
ELECT REM PT RETURN 9FT ADLT (ELECTROSURGICAL) ×3
ELECTRODE REM PT RTRN 9FT ADLT (ELECTROSURGICAL) ×1 IMPLANT
FIXATION ZIPTIGHT ANKLE SNDSMS (Ankle) IMPLANT
GAUZE SPONGE 4X4 12PLY STRL (GAUZE/BANDAGES/DRESSINGS) ×3 IMPLANT
GLOVE BIO SURGEON STRL SZ8 (GLOVE) ×3 IMPLANT
GLOVE BIOGEL PI IND STRL 8 (GLOVE) ×2 IMPLANT
GLOVE BIOGEL PI INDICATOR 8 (GLOVE) ×4
GLOVE ECLIPSE 8.0 STRL XLNG CF (GLOVE) ×3 IMPLANT
GOWN STRL REUS W/ TWL LRG LVL3 (GOWN DISPOSABLE) ×1 IMPLANT
GOWN STRL REUS W/ TWL XL LVL3 (GOWN DISPOSABLE) ×2 IMPLANT
GOWN STRL REUS W/TWL LRG LVL3 (GOWN DISPOSABLE) ×2
GOWN STRL REUS W/TWL XL LVL3 (GOWN DISPOSABLE) ×4
K-WIRE ACE 1.6X6 (WIRE) ×6
KWIRE ACE 1.6X6 (WIRE) IMPLANT
NEEDLE HYPO 22GX1.5 SAFETY (NEEDLE) IMPLANT
NS IRRIG 1000ML POUR BTL (IV SOLUTION) ×3 IMPLANT
PACK BASIN DAY SURGERY FS (CUSTOM PROCEDURE TRAY) ×3 IMPLANT
PAD CAST 4YDX4 CTTN HI CHSV (CAST SUPPLIES) ×1 IMPLANT
PADDING CAST ABS 4INX4YD NS (CAST SUPPLIES)
PADDING CAST ABS COTTON 4X4 ST (CAST SUPPLIES) IMPLANT
PADDING CAST COTTON 4X4 STRL (CAST SUPPLIES) ×2
PADDING CAST COTTON 6X4 STRL (CAST SUPPLIES) ×3 IMPLANT
PENCIL BUTTON HOLSTER BLD 10FT (ELECTRODE) ×3 IMPLANT
PLATE FIBULAR COMP LOCK 10H (Plate) ×2 IMPLANT
SANITIZER HAND PURELL 535ML FO (MISCELLANEOUS) ×3 IMPLANT
SCREW ACE CAN 4.0 40M (Screw) ×4 IMPLANT
SCREW LOCK CORT STAR 3.5X10 (Screw) ×2 IMPLANT
SCREW LOCK CORT STAR 3.5X12 (Screw) ×2 IMPLANT
SCREW LOCK CORT STAR 3.5X14 (Screw) ×2 IMPLANT
SCREW LOCK CORT STAR 3.5X16 (Screw) ×2 IMPLANT
SCREW LOW PROFILE 18MMX3.5MM (Screw) ×2 IMPLANT
SCREW NON LOCKING LP 3.5 14MM (Screw) ×4 IMPLANT
SHEET MEDIUM DRAPE 40X70 STRL (DRAPES) ×3 IMPLANT
SLEEVE SCD COMPRESS KNEE MED (MISCELLANEOUS) ×3 IMPLANT
SPLINT FAST PLASTER 5X30 (CAST SUPPLIES) ×40
SPLINT PLASTER CAST FAST 5X30 (CAST SUPPLIES) ×20 IMPLANT
SPONGE LAP 18X18 RF (DISPOSABLE) ×3 IMPLANT
STOCKINETTE 6  STRL (DRAPES) ×2
STOCKINETTE 6 STRL (DRAPES) ×1 IMPLANT
SUCTION FRAZIER HANDLE 10FR (MISCELLANEOUS) ×2
SUCTION TUBE FRAZIER 10FR DISP (MISCELLANEOUS) ×1 IMPLANT
SUT ETHILON 3 0 PS 1 (SUTURE) ×5 IMPLANT
SUT FIBERWIRE #2 38 T-5 BLUE (SUTURE)
SUT MNCRL AB 3-0 PS2 18 (SUTURE) ×6 IMPLANT
SUT VIC AB 0 SH 27 (SUTURE) IMPLANT
SUT VIC AB 2-0 SH 27 (SUTURE) ×4
SUT VIC AB 2-0 SH 27XBRD (SUTURE) ×1 IMPLANT
SUTURE FIBERWR #2 38 T-5 BLUE (SUTURE) IMPLANT
SYR BULB 3OZ (MISCELLANEOUS) ×3 IMPLANT
SYR CONTROL 10ML LL (SYRINGE) IMPLANT
TOWEL GREEN STERILE FF (TOWEL DISPOSABLE) ×6 IMPLANT
TUBE CONNECTING 20'X1/4 (TUBING) ×1
TUBE CONNECTING 20X1/4 (TUBING) ×2 IMPLANT
UNDERPAD 30X30 (UNDERPADS AND DIAPERS) ×3 IMPLANT
ZIPTIGHT ANKLE SYNODESMOSS FIX (Ankle) ×3 IMPLANT
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
## 2018-12-19 NOTE — Anesthesia Procedure Notes (Signed)
Procedure Name: LMA Insertion Date/Time: 12/19/2018 7:49 AM Performed by: Maryella Shivers, CRNA Pre-anesthesia Checklist: Patient identified, Emergency Drugs available, Suction available and Patient being monitored Patient Re-evaluated:Patient Re-evaluated prior to induction Oxygen Delivery Method: Circle system utilized Preoxygenation: Pre-oxygenation with 100% oxygen Induction Type: IV induction Ventilation: Mask ventilation without difficulty LMA: LMA inserted LMA Size: 4.0 Number of attempts: 1 Airway Equipment and Method: Bite block Placement Confirmation: positive ETCO2 Tube secured with: Tape Dental Injury: Teeth and Oropharynx as per pre-operative assessment

## 2018-12-19 NOTE — Anesthesia Procedure Notes (Signed)
Anesthesia Procedure Image    

## 2018-12-19 NOTE — Discharge Instructions (Addendum)
Wylene Simmer, MD [place]  Please read the following information regarding your care after surgery.  Medications  You only need a prescription for the narcotic pain medicine (ex. oxycodone, Percocet, Norco).  All of the other medicines listed below are available over the counter. X Aleve 2 pills twice a day for the first 3 days after surgery. X acetominophen (Tylenol) 650 mg every 4-6 hours as you need for minor to moderate pain X oxycodone as prescribed for severe pain  Narcotic pain medicine (ex. oxycodone, Percocet, Vicodin) will cause constipation.  To prevent this problem, take the following medicines while you are taking any pain medicine. X docusate sodium (Colace) 100 mg twice a day X senna (Senokot) 2 tablets twice a day  X To help prevent blood clots, take a baby aspirin (81 mg) twice a day after surgery.  You should also get up every hour while you are awake to move around.    Weight Bearing X Do not bear any weight on the operated leg or foot.  Cast / Splint / Dressing X Keep your splint, cast or dressing clean and dry.  Dont put anything (coat hanger, pencil, etc) down inside of it.  If it gets damp, use a hair dryer on the cool setting to dry it.  If it gets soaked, call the office to schedule an appointment for a cast change.   After your dressing, cast or splint is removed; you may shower, but do not soak or scrub the wound.  Allow the water to run over it, and then gently pat it dry.  Swelling It is normal for you to have swelling where you had surgery.  To reduce swelling and pain, keep your toes above your nose for at least 3 days after surgery.  It may be necessary to keep your foot or leg elevated for several weeks.  If it hurts, it should be elevated.  Follow Up Call my office at [phone] when you are discharged from the hospital or surgery center to schedule an appointment to be seen two weeks after surgery.  Call my office at [phone] if  you develop a fever >101.5 F, nausea, vomiting, bleeding from the surgical site or severe pain.    Regional Anesthesia Blocks  1. Numbness or the inability to move the "blocked" extremity may last from 3-48 hours after placement. The length of time depends on the medication injected and your individual response to the medication. If the numbness is not going away after 48 hours, call your surgeon.  2. The extremity that is blocked will need to be protected until the numbness is gone and the  Strength has returned. Because you cannot feel it, you will need to take extra care to avoid injury. Because it may be weak, you may have difficulty moving it or using it. You may not know what position it is in without looking at it while the block is in effect.  3. For blocks in the legs and feet, returning to weight bearing and walking needs to be done carefully. You will need to wait until the numbness is entirely gone and the strength has returned. You should be able to move your leg and foot normally before you try and bear weight or walk. You will need someone to be with you when you first try to ensure you do not fall and possibly risk injury.  4. Bruising and tenderness at the needle site are common side effects and will resolve in a  few days.  5. Persistent numbness or new problems with movement should be communicated to the surgeon or the [place] [phone]) [place] Instructions  Activity: Get plenty of rest for the remainder of the day. A responsible individual must stay with you for 24 hours following the procedure.  For the next 24 hours, DO NOT: -Drive a car -Paediatric nurse -Drink alcoholic beverages -Take any medication unless instructed by your physician -Make any legal decisions or sign important papers.  Meals: Start with liquid foods such as gelatin or soup. Progress to regular foods as tolerated. Avoid greasy, spicy, heavy foods. If nausea  and/or vomiting occur, drink only clear liquids until the nausea and/or vomiting subsides. Call your physician if vomiting continues.  Special Instructions/Symptoms: Your throat may feel dry or sore from the anesthesia or the breathing tube placed in your throat during surgery. If this causes discomfort, gargle with warm salt water. The discomfort should disappear within 24 hours.

## 2018-12-19 NOTE — Anesthesia Preprocedure Evaluation (Signed)
Anesthesia Evaluation  Patient identified by MRN, date of birth, ID band Patient awake    Reviewed: Allergy & Precautions, NPO status , Patient's Chart, lab work & pertinent test results  Airway Mallampati: II  TM Distance: >3 FB Neck ROM: Full    Dental no notable dental hx.    Pulmonary neg pulmonary ROS,    Pulmonary exam normal breath sounds clear to auscultation       Cardiovascular hypertension, Normal cardiovascular exam Rhythm:Regular Rate:Normal     Neuro/Psych negative neurological ROS  negative psych ROS   GI/Hepatic Neg liver ROS, GERD  ,  Endo/Other  Hypothyroidism   Renal/GU negative Renal ROS  negative genitourinary   Musculoskeletal negative musculoskeletal ROS (+)   Abdominal   Peds negative pediatric ROS (+)  Hematology negative hematology ROS (+)   Anesthesia Other Findings   Reproductive/Obstetrics negative OB ROS                             Anesthesia Physical Anesthesia Plan  ASA: II  Anesthesia Plan: General   Post-op Pain Management:  Regional for Post-op pain   Induction: Intravenous and Rapid sequence  PONV Risk Score and Plan: 3 and Ondansetron, Dexamethasone and Treatment may vary due to age or medical condition  Airway Management Planned: Oral ETT  Additional Equipment:   Intra-op Plan:   Post-operative Plan: Extubation in OR  Informed Consent: I have reviewed the patients History and Physical, chart, labs and discussed the procedure including the risks, benefits and alternatives for the proposed anesthesia with the patient or authorized representative who has indicated his/her understanding and acceptance.     Dental advisory given  Plan Discussed with: CRNA and Surgeon  Anesthesia Plan Comments:         Anesthesia Quick Evaluation

## 2018-12-19 NOTE — Transfer of Care (Signed)
Immediate Anesthesia Transfer of Care Note  Patient: Alyssa Fisher  Procedure(s) Performed: Removal of hardware and open reduction internal fixation of right ankle bimalleolar fracture (Right )  Patient Location: PACU  Anesthesia Type:GA combined with regional for post-op pain  Level of Consciousness: awake, alert  and oriented  Airway & Oxygen Therapy: Patient Spontanous Breathing and Patient connected to face mask oxygen  Post-op Assessment: Report given to RN and Post -op Vital signs reviewed and stable  Post vital signs: Reviewed and stable  Last Vitals:  Vitals Value Taken Time  BP 119/68 12/19/2018  9:31 AM  Temp    Pulse 102 12/19/2018  9:33 AM  Resp 12 12/19/2018  9:33 AM  SpO2 100 % 12/19/2018  9:33 AM  Vitals shown include unvalidated device data.  Last Pain:  Vitals:   12/19/18 0640  TempSrc: Oral  PainSc: 1       Patients Stated Pain Goal: 1 (16/60/63 0160)  Complications: No apparent anesthesia complications

## 2018-12-19 NOTE — Anesthesia Postprocedure Evaluation (Signed)
Anesthesia Post Note  Patient: Alyssa Fisher  Procedure(s) Performed: Removal of hardware and open reduction internal fixation of right ankle bimalleolar fracture (Right )     Patient location during evaluation: PACU Anesthesia Type: General Level of consciousness: awake and alert Pain management: pain level controlled Vital Signs Assessment: post-procedure vital signs reviewed and stable Respiratory status: spontaneous breathing, nonlabored ventilation, respiratory function stable and patient connected to nasal cannula oxygen Cardiovascular status: blood pressure returned to baseline and stable Postop Assessment: no apparent nausea or vomiting Anesthetic complications: no    Last Vitals:  Vitals:   12/19/18 1015 12/19/18 1030  BP: 101/67 (!) 102/59  Pulse: 83 87  Resp: 12 13  Temp:    SpO2: 100% 100%    Last Pain:  Vitals:   12/19/18 1015  TempSrc:   PainSc: Asleep                 Markanthony Gedney S

## 2018-12-19 NOTE — Anesthesia Procedure Notes (Signed)
Anesthesia Regional Block: Popliteal block   Pre-Anesthetic Checklist: ,, timeout performed, Correct Patient, Correct Site, Correct Laterality, Correct Procedure, Correct Position, site marked, Risks and benefits discussed,  Surgical consent,  Pre-op evaluation,  At surgeon's request and post-op pain management  Laterality: Right  Prep: chloraprep       Needles:  Injection technique: Single-shot  Needle Type: Echogenic Needle     Needle Length: 9cm      Additional Needles:   Procedures:,,,, ultrasound used (permanent image in chart),,,,  Narrative:  Start time: 12/19/2018 7:00 AM End time: 12/19/2018 7:08 AM Injection made incrementally with aspirations every 5 mL.  Performed by: Personally  Anesthesiologist: Myrtie Soman, MD  Additional Notes: Patient tolerated the procedure well without complications
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
## 2018-12-19 NOTE — Progress Notes (Signed)
Assisted Dr. Rose with right, ultrasound guided, popliteal/saphenous block. Side rails up, monitors on throughout procedure. See vital signs in flow sheet. Tolerated Procedure well.  

## 2018-12-20 ENCOUNTER — Encounter (HOSPITAL_BASED_OUTPATIENT_CLINIC_OR_DEPARTMENT_OTHER): Payer: Self-pay | Admitting: Orthopedic Surgery

## 2018-12-27 DIAGNOSIS — E039 Hypothyroidism, unspecified: Secondary | ICD-10-CM | POA: Diagnosis not present

## 2018-12-27 DIAGNOSIS — R635 Abnormal weight gain: Secondary | ICD-10-CM | POA: Diagnosis not present

## 2018-12-27 DIAGNOSIS — I10 Essential (primary) hypertension: Secondary | ICD-10-CM | POA: Diagnosis not present

## 2018-12-27 DIAGNOSIS — E559 Vitamin D deficiency, unspecified: Secondary | ICD-10-CM | POA: Diagnosis not present

## 2019-01-01 DIAGNOSIS — Z5189 Encounter for other specified aftercare: Secondary | ICD-10-CM | POA: Diagnosis not present

## 2019-01-01 DIAGNOSIS — Z978 Presence of other specified devices: Secondary | ICD-10-CM | POA: Diagnosis not present

## 2019-01-01 DIAGNOSIS — S82841D Displaced bimalleolar fracture of right lower leg, subsequent encounter for closed fracture with routine healing: Secondary | ICD-10-CM | POA: Diagnosis not present

## 2019-01-31 DIAGNOSIS — M25571 Pain in right ankle and joints of right foot: Secondary | ICD-10-CM | POA: Diagnosis not present

## 2019-01-31 DIAGNOSIS — Z5189 Encounter for other specified aftercare: Secondary | ICD-10-CM | POA: Diagnosis not present

## 2019-01-31 DIAGNOSIS — S82841D Displaced bimalleolar fracture of right lower leg, subsequent encounter for closed fracture with routine healing: Secondary | ICD-10-CM | POA: Diagnosis not present

## 2019-01-31 DIAGNOSIS — Z978 Presence of other specified devices: Secondary | ICD-10-CM | POA: Diagnosis not present

## 2019-02-05 DIAGNOSIS — I10 Essential (primary) hypertension: Secondary | ICD-10-CM | POA: Diagnosis not present

## 2019-02-05 DIAGNOSIS — H40033 Anatomical narrow angle, bilateral: Secondary | ICD-10-CM | POA: Diagnosis not present

## 2019-02-05 DIAGNOSIS — R5383 Other fatigue: Secondary | ICD-10-CM | POA: Diagnosis not present

## 2019-02-05 DIAGNOSIS — E039 Hypothyroidism, unspecified: Secondary | ICD-10-CM | POA: Diagnosis not present

## 2019-02-05 DIAGNOSIS — R55 Syncope and collapse: Secondary | ICD-10-CM | POA: Diagnosis not present

## 2019-02-05 DIAGNOSIS — H2513 Age-related nuclear cataract, bilateral: Secondary | ICD-10-CM | POA: Diagnosis not present

## 2019-02-07 DIAGNOSIS — E039 Hypothyroidism, unspecified: Secondary | ICD-10-CM | POA: Diagnosis not present

## 2019-02-07 DIAGNOSIS — E559 Vitamin D deficiency, unspecified: Secondary | ICD-10-CM | POA: Diagnosis not present

## 2019-02-07 DIAGNOSIS — I10 Essential (primary) hypertension: Secondary | ICD-10-CM | POA: Diagnosis not present

## 2019-03-03 DIAGNOSIS — Z978 Presence of other specified devices: Secondary | ICD-10-CM | POA: Diagnosis not present

## 2019-03-03 DIAGNOSIS — S82841D Displaced bimalleolar fracture of right lower leg, subsequent encounter for closed fracture with routine healing: Secondary | ICD-10-CM | POA: Diagnosis not present

## 2019-03-03 DIAGNOSIS — Z5189 Encounter for other specified aftercare: Secondary | ICD-10-CM | POA: Diagnosis not present

## 2019-03-03 DIAGNOSIS — M25571 Pain in right ankle and joints of right foot: Secondary | ICD-10-CM | POA: Diagnosis not present

## 2019-03-06 DIAGNOSIS — M25571 Pain in right ankle and joints of right foot: Secondary | ICD-10-CM | POA: Diagnosis not present

## 2019-03-12 DIAGNOSIS — M25571 Pain in right ankle and joints of right foot: Secondary | ICD-10-CM | POA: Diagnosis not present

## 2019-03-17 DIAGNOSIS — M25571 Pain in right ankle and joints of right foot: Secondary | ICD-10-CM | POA: Diagnosis not present

## 2019-03-20 DIAGNOSIS — M25571 Pain in right ankle and joints of right foot: Secondary | ICD-10-CM | POA: Diagnosis not present

## 2019-03-24 DIAGNOSIS — M25571 Pain in right ankle and joints of right foot: Secondary | ICD-10-CM | POA: Diagnosis not present

## 2019-03-27 DIAGNOSIS — M25571 Pain in right ankle and joints of right foot: Secondary | ICD-10-CM | POA: Diagnosis not present

## 2019-03-31 DIAGNOSIS — M25571 Pain in right ankle and joints of right foot: Secondary | ICD-10-CM | POA: Diagnosis not present

## 2019-03-31 DIAGNOSIS — Z978 Presence of other specified devices: Secondary | ICD-10-CM | POA: Diagnosis not present

## 2019-03-31 DIAGNOSIS — S82841D Displaced bimalleolar fracture of right lower leg, subsequent encounter for closed fracture with routine healing: Secondary | ICD-10-CM | POA: Diagnosis not present

## 2019-03-31 DIAGNOSIS — Z09 Encounter for follow-up examination after completed treatment for conditions other than malignant neoplasm: Secondary | ICD-10-CM | POA: Diagnosis not present

## 2019-04-02 DIAGNOSIS — F331 Major depressive disorder, recurrent, moderate: Secondary | ICD-10-CM | POA: Diagnosis not present

## 2019-04-02 DIAGNOSIS — F411 Generalized anxiety disorder: Secondary | ICD-10-CM | POA: Diagnosis not present

## 2019-04-02 DIAGNOSIS — K219 Gastro-esophageal reflux disease without esophagitis: Secondary | ICD-10-CM | POA: Diagnosis not present

## 2019-04-03 DIAGNOSIS — M25571 Pain in right ankle and joints of right foot: Secondary | ICD-10-CM | POA: Diagnosis not present

## 2019-04-07 DIAGNOSIS — M25571 Pain in right ankle and joints of right foot: Secondary | ICD-10-CM | POA: Diagnosis not present

## 2019-04-10 DIAGNOSIS — M25571 Pain in right ankle and joints of right foot: Secondary | ICD-10-CM | POA: Diagnosis not present

## 2019-04-14 DIAGNOSIS — M25571 Pain in right ankle and joints of right foot: Secondary | ICD-10-CM | POA: Diagnosis not present

## 2019-04-17 DIAGNOSIS — M25571 Pain in right ankle and joints of right foot: Secondary | ICD-10-CM | POA: Diagnosis not present

## 2019-04-24 DIAGNOSIS — M25571 Pain in right ankle and joints of right foot: Secondary | ICD-10-CM | POA: Diagnosis not present

## 2019-05-09 DIAGNOSIS — E039 Hypothyroidism, unspecified: Secondary | ICD-10-CM | POA: Diagnosis not present

## 2019-05-15 DIAGNOSIS — K219 Gastro-esophageal reflux disease without esophagitis: Secondary | ICD-10-CM | POA: Diagnosis not present

## 2019-05-15 DIAGNOSIS — E039 Hypothyroidism, unspecified: Secondary | ICD-10-CM | POA: Diagnosis not present

## 2019-05-15 DIAGNOSIS — I10 Essential (primary) hypertension: Secondary | ICD-10-CM | POA: Diagnosis not present

## 2019-05-15 DIAGNOSIS — F331 Major depressive disorder, recurrent, moderate: Secondary | ICD-10-CM | POA: Diagnosis not present

## 2019-05-15 DIAGNOSIS — F411 Generalized anxiety disorder: Secondary | ICD-10-CM | POA: Diagnosis not present

## 2019-07-04 DIAGNOSIS — E079 Disorder of thyroid, unspecified: Secondary | ICD-10-CM | POA: Diagnosis not present

## 2019-07-17 DIAGNOSIS — E039 Hypothyroidism, unspecified: Secondary | ICD-10-CM | POA: Diagnosis not present

## 2019-07-17 DIAGNOSIS — F331 Major depressive disorder, recurrent, moderate: Secondary | ICD-10-CM | POA: Diagnosis not present

## 2019-07-17 DIAGNOSIS — I10 Essential (primary) hypertension: Secondary | ICD-10-CM | POA: Diagnosis not present

## 2019-07-17 DIAGNOSIS — J309 Allergic rhinitis, unspecified: Secondary | ICD-10-CM | POA: Diagnosis not present

## 2019-09-15 DIAGNOSIS — Z6830 Body mass index (BMI) 30.0-30.9, adult: Secondary | ICD-10-CM | POA: Diagnosis not present

## 2019-09-15 DIAGNOSIS — E559 Vitamin D deficiency, unspecified: Secondary | ICD-10-CM | POA: Diagnosis not present

## 2019-09-15 DIAGNOSIS — F331 Major depressive disorder, recurrent, moderate: Secondary | ICD-10-CM | POA: Diagnosis not present

## 2019-09-15 DIAGNOSIS — F411 Generalized anxiety disorder: Secondary | ICD-10-CM | POA: Diagnosis not present

## 2019-09-15 DIAGNOSIS — E039 Hypothyroidism, unspecified: Secondary | ICD-10-CM | POA: Diagnosis not present

## 2019-10-02 DIAGNOSIS — Z03818 Encounter for observation for suspected exposure to other biological agents ruled out: Secondary | ICD-10-CM | POA: Diagnosis not present

## 2019-10-02 DIAGNOSIS — J209 Acute bronchitis, unspecified: Secondary | ICD-10-CM | POA: Diagnosis not present

## 2019-10-07 DIAGNOSIS — K219 Gastro-esophageal reflux disease without esophagitis: Secondary | ICD-10-CM | POA: Diagnosis not present

## 2019-10-07 DIAGNOSIS — E559 Vitamin D deficiency, unspecified: Secondary | ICD-10-CM | POA: Diagnosis not present

## 2019-10-07 DIAGNOSIS — E039 Hypothyroidism, unspecified: Secondary | ICD-10-CM | POA: Diagnosis not present

## 2019-10-09 DIAGNOSIS — K219 Gastro-esophageal reflux disease without esophagitis: Secondary | ICD-10-CM | POA: Diagnosis not present

## 2019-10-09 DIAGNOSIS — E559 Vitamin D deficiency, unspecified: Secondary | ICD-10-CM | POA: Diagnosis not present

## 2019-10-09 DIAGNOSIS — G4700 Insomnia, unspecified: Secondary | ICD-10-CM | POA: Diagnosis not present

## 2019-10-09 DIAGNOSIS — E039 Hypothyroidism, unspecified: Secondary | ICD-10-CM | POA: Diagnosis not present

## 2019-11-17 DIAGNOSIS — Z0000 Encounter for general adult medical examination without abnormal findings: Secondary | ICD-10-CM | POA: Diagnosis not present

## 2020-02-23 DIAGNOSIS — Z1283 Encounter for screening for malignant neoplasm of skin: Secondary | ICD-10-CM | POA: Diagnosis not present

## 2020-02-23 DIAGNOSIS — L821 Other seborrheic keratosis: Secondary | ICD-10-CM | POA: Diagnosis not present

## 2020-03-03 DIAGNOSIS — I10 Essential (primary) hypertension: Secondary | ICD-10-CM | POA: Diagnosis not present

## 2020-03-03 DIAGNOSIS — E559 Vitamin D deficiency, unspecified: Secondary | ICD-10-CM | POA: Diagnosis not present

## 2020-03-03 DIAGNOSIS — E039 Hypothyroidism, unspecified: Secondary | ICD-10-CM | POA: Diagnosis not present

## 2020-03-05 DIAGNOSIS — G4700 Insomnia, unspecified: Secondary | ICD-10-CM | POA: Diagnosis not present

## 2020-03-05 DIAGNOSIS — E1169 Type 2 diabetes mellitus with other specified complication: Secondary | ICD-10-CM | POA: Diagnosis not present

## 2020-03-05 DIAGNOSIS — F331 Major depressive disorder, recurrent, moderate: Secondary | ICD-10-CM | POA: Diagnosis not present

## 2020-03-05 DIAGNOSIS — E782 Mixed hyperlipidemia: Secondary | ICD-10-CM | POA: Diagnosis not present

## 2020-03-05 DIAGNOSIS — E039 Hypothyroidism, unspecified: Secondary | ICD-10-CM | POA: Diagnosis not present

## 2020-03-05 DIAGNOSIS — E559 Vitamin D deficiency, unspecified: Secondary | ICD-10-CM | POA: Diagnosis not present

## 2020-03-05 DIAGNOSIS — F411 Generalized anxiety disorder: Secondary | ICD-10-CM | POA: Diagnosis not present

## 2020-04-09 DIAGNOSIS — F331 Major depressive disorder, recurrent, moderate: Secondary | ICD-10-CM | POA: Diagnosis not present

## 2020-04-09 DIAGNOSIS — F411 Generalized anxiety disorder: Secondary | ICD-10-CM | POA: Diagnosis not present

## 2020-04-09 DIAGNOSIS — J342 Deviated nasal septum: Secondary | ICD-10-CM | POA: Diagnosis not present

## 2020-04-09 DIAGNOSIS — J309 Allergic rhinitis, unspecified: Secondary | ICD-10-CM | POA: Diagnosis not present

## 2020-04-09 DIAGNOSIS — G4700 Insomnia, unspecified: Secondary | ICD-10-CM | POA: Diagnosis not present

## 2020-05-21 DIAGNOSIS — G4700 Insomnia, unspecified: Secondary | ICD-10-CM | POA: Diagnosis not present

## 2020-05-21 DIAGNOSIS — F331 Major depressive disorder, recurrent, moderate: Secondary | ICD-10-CM | POA: Diagnosis not present

## 2020-05-21 DIAGNOSIS — F411 Generalized anxiety disorder: Secondary | ICD-10-CM | POA: Diagnosis not present

## 2020-05-21 DIAGNOSIS — R5383 Other fatigue: Secondary | ICD-10-CM | POA: Diagnosis not present

## 2020-05-21 DIAGNOSIS — M545 Low back pain: Secondary | ICD-10-CM | POA: Diagnosis not present

## 2020-05-21 DIAGNOSIS — M25559 Pain in unspecified hip: Secondary | ICD-10-CM | POA: Diagnosis not present

## 2020-05-27 DIAGNOSIS — Z23 Encounter for immunization: Secondary | ICD-10-CM | POA: Diagnosis not present

## 2020-05-27 DIAGNOSIS — M7061 Trochanteric bursitis, right hip: Secondary | ICD-10-CM | POA: Diagnosis not present

## 2020-05-27 DIAGNOSIS — M7062 Trochanteric bursitis, left hip: Secondary | ICD-10-CM | POA: Diagnosis not present

## 2020-06-07 DIAGNOSIS — K219 Gastro-esophageal reflux disease without esophagitis: Secondary | ICD-10-CM | POA: Diagnosis not present

## 2020-06-07 DIAGNOSIS — G4700 Insomnia, unspecified: Secondary | ICD-10-CM | POA: Diagnosis not present

## 2020-06-07 DIAGNOSIS — F411 Generalized anxiety disorder: Secondary | ICD-10-CM | POA: Diagnosis not present

## 2020-06-16 DIAGNOSIS — L942 Calcinosis cutis: Secondary | ICD-10-CM | POA: Diagnosis not present

## 2020-06-16 DIAGNOSIS — L981 Factitial dermatitis: Secondary | ICD-10-CM | POA: Diagnosis not present

## 2020-07-19 DIAGNOSIS — Z780 Asymptomatic menopausal state: Secondary | ICD-10-CM | POA: Diagnosis not present

## 2020-07-19 DIAGNOSIS — F331 Major depressive disorder, recurrent, moderate: Secondary | ICD-10-CM | POA: Diagnosis not present

## 2020-07-19 DIAGNOSIS — G4700 Insomnia, unspecified: Secondary | ICD-10-CM | POA: Diagnosis not present

## 2020-07-19 DIAGNOSIS — J309 Allergic rhinitis, unspecified: Secondary | ICD-10-CM | POA: Diagnosis not present

## 2020-07-19 DIAGNOSIS — F411 Generalized anxiety disorder: Secondary | ICD-10-CM | POA: Diagnosis not present

## 2020-08-02 DIAGNOSIS — E559 Vitamin D deficiency, unspecified: Secondary | ICD-10-CM | POA: Diagnosis not present

## 2020-08-02 DIAGNOSIS — E039 Hypothyroidism, unspecified: Secondary | ICD-10-CM | POA: Diagnosis not present

## 2020-08-02 DIAGNOSIS — E7800 Pure hypercholesterolemia, unspecified: Secondary | ICD-10-CM | POA: Diagnosis not present

## 2020-09-15 DIAGNOSIS — F411 Generalized anxiety disorder: Secondary | ICD-10-CM | POA: Diagnosis not present

## 2020-09-15 DIAGNOSIS — Z20822 Contact with and (suspected) exposure to covid-19: Secondary | ICD-10-CM | POA: Diagnosis not present

## 2020-09-15 DIAGNOSIS — G4700 Insomnia, unspecified: Secondary | ICD-10-CM | POA: Diagnosis not present

## 2020-09-15 DIAGNOSIS — F331 Major depressive disorder, recurrent, moderate: Secondary | ICD-10-CM | POA: Diagnosis not present

## 2020-09-15 DIAGNOSIS — R5383 Other fatigue: Secondary | ICD-10-CM | POA: Diagnosis not present

## 2020-09-18 ENCOUNTER — Other Ambulatory Visit: Payer: Medicare PPO

## 2020-09-18 DIAGNOSIS — Z20822 Contact with and (suspected) exposure to covid-19: Secondary | ICD-10-CM

## 2020-09-21 LAB — NOVEL CORONAVIRUS, NAA: SARS-CoV-2, NAA: NOT DETECTED

## 2020-11-23 ENCOUNTER — Other Ambulatory Visit (HOSPITAL_COMMUNITY): Payer: Self-pay | Admitting: Family Medicine

## 2020-11-23 DIAGNOSIS — Z0000 Encounter for general adult medical examination without abnormal findings: Secondary | ICD-10-CM | POA: Diagnosis not present

## 2020-11-23 DIAGNOSIS — Z1331 Encounter for screening for depression: Secondary | ICD-10-CM | POA: Diagnosis not present

## 2020-11-23 DIAGNOSIS — Z1339 Encounter for screening examination for other mental health and behavioral disorders: Secondary | ICD-10-CM | POA: Diagnosis not present

## 2020-11-25 DIAGNOSIS — F439 Reaction to severe stress, unspecified: Secondary | ICD-10-CM | POA: Diagnosis not present

## 2020-11-25 DIAGNOSIS — J309 Allergic rhinitis, unspecified: Secondary | ICD-10-CM | POA: Diagnosis not present

## 2020-11-25 DIAGNOSIS — M7062 Trochanteric bursitis, left hip: Secondary | ICD-10-CM | POA: Diagnosis not present

## 2020-11-25 DIAGNOSIS — M7061 Trochanteric bursitis, right hip: Secondary | ICD-10-CM | POA: Diagnosis not present

## 2020-11-25 DIAGNOSIS — Z01419 Encounter for gynecological examination (general) (routine) without abnormal findings: Secondary | ICD-10-CM | POA: Diagnosis not present

## 2020-11-25 DIAGNOSIS — Z1211 Encounter for screening for malignant neoplasm of colon: Secondary | ICD-10-CM | POA: Diagnosis not present

## 2020-11-25 DIAGNOSIS — Z118 Encounter for screening for other infectious and parasitic diseases: Secondary | ICD-10-CM | POA: Diagnosis not present

## 2021-01-13 DIAGNOSIS — F331 Major depressive disorder, recurrent, moderate: Secondary | ICD-10-CM | POA: Diagnosis not present

## 2021-01-13 DIAGNOSIS — M5430 Sciatica, unspecified side: Secondary | ICD-10-CM | POA: Diagnosis not present

## 2021-01-13 DIAGNOSIS — F411 Generalized anxiety disorder: Secondary | ICD-10-CM | POA: Diagnosis not present

## 2021-01-13 DIAGNOSIS — G4700 Insomnia, unspecified: Secondary | ICD-10-CM | POA: Diagnosis not present

## 2021-01-13 DIAGNOSIS — J309 Allergic rhinitis, unspecified: Secondary | ICD-10-CM | POA: Diagnosis not present

## 2022-01-05 ENCOUNTER — Encounter: Payer: Self-pay | Admitting: Family

## 2022-01-05 ENCOUNTER — Ambulatory Visit (INDEPENDENT_AMBULATORY_CARE_PROVIDER_SITE_OTHER): Payer: Medicare Other | Admitting: Family

## 2022-01-05 VITALS — BP 129/79 | HR 75 | Temp 98.5°F | Ht 65.0 in | Wt 185.4 lb

## 2022-01-05 DIAGNOSIS — E890 Postprocedural hypothyroidism: Secondary | ICD-10-CM | POA: Diagnosis not present

## 2022-01-05 DIAGNOSIS — M5416 Radiculopathy, lumbar region: Secondary | ICD-10-CM

## 2022-01-05 HISTORY — DX: Radiculopathy, lumbar region: M54.16

## 2022-01-05 LAB — T4, FREE: Free T4: 1.26 ng/dL (ref 0.60–1.60)

## 2022-01-05 LAB — TSH: TSH: 0.04 u[IU]/mL — ABNORMAL LOW (ref 0.35–5.50)

## 2022-01-05 MED ORDER — MELOXICAM 7.5 MG PO TABS: 7.5000 mg | ORAL_TABLET | Freq: Every day | ORAL | 0 refills | Status: DC

## 2022-01-05 NOTE — Progress Notes (Signed)
? ?New Patient Office Visit ? ?Subjective:  ?Patient ID: Alyssa Fisher, female    DOB: 04-17-1966  Age: 56 y.o. MRN: [id number] ? ?CC:  ?Chief Complaint  ?Patient presents with  ? Establish Care  ? Hypothyroidism  ?  Pt would like a recheck of thyroid levels and referral to endocrinologist.   ? Back Pain  ?  Pt c/o Sharp/aching lower back pain from a fall in 2011. Pain has never went away. Mostly when moving around constant pain. Has tried ibuprofen which does help a little.  ? ?HPI ?Alyssa Fisher presents for establishing care today. ?Hypothyroidism: Patient presents today for followup of Hypothyroidism.  ?Patient reports positive compliance with daily medication.  ?Patient denies any of the following symptoms: fatigue, cold intolerance, constipation, weight gain or inability to lose weight, muscle weakness, mental slowing, dry hair and skin. ?Last TSH and free T4: ?Lab Results  ?Component Value Date  ? FREE T4 1.26 01/05/2022  ? TSH 0.04 (L) 01/05/2022  ? TSH 1.073 03/31/2013  ? TSH 3.641 02/17/2013  ?Pain ?She reports chronic lumbar pain. There was an injury that may have caused the pain. The pain started  years ago  and is gradually worsening. The pain does not radiate. The pain is described as aching, burning, and soreness, is severe in intensity, occurring constantly. Symptoms are worse in the: all the time.Aggravating factors: sitting, standing, walking, and walking uphill She has tried application of heat, application of ice, acetaminophen, NSAIDs, and prescription pain relievers with mild relief. Pt states she fell years ago and broke her right ankle, but her back started hurting at same time, but did not have imaging done. ? ? ?Assessment & Plan:  ? ?Problem List Items Addressed This Visit   ? ?  ? Endocrine  ? Hypothyroidism, postsurgical - Primary  ?  Chronic - pt reports having recent med dose changes, TSH 70-80 range and was due to have f/u labs in [place], but had to move back to [place] d/t mother's health.  Rechecking today, pt taking 121mg.  ? ?  ?  ? Relevant Medications  ? levothyroxine (SYNTHROID) 75 MCG tablet  ? Other Relevant Orders  ? T4, free  ? TSH  ?  ? Nervous and Auditory  ? Chronic left-sided lumbar radiculopathy  ?  fell many years ago and broke her right ankle, also injured left lumbar area, she has had left hip xray in past, but never spine, reports having pain continuously since and takes Ibuprofen '800mg'$  qhs so pain relieved for her to sleep. Starting Mobic, advised on use & SE and to not take with Ibuprofen, [facility]. ? ?  ?  ? Relevant Medications  ? meloxicam (MOBIC) 7.5 MG tablet  ? tiZANidine (ZANAFLEX) 4 MG tablet  ?   ?   ?   ? Other Relevant Orders  ? Ambulatory referral to Orthopedic Surgery  ? ?Past Medical History:  ?Diagnosis Date  ? Anxiety   ? Broken ankle   ? Chronic left-sided lumbar radiculopathy 01/05/2022  ? Depression   ? Dizziness - light-headed/fainting 02/20/2011  ? Dysphagia 10/23/2012  ? due to thyroid issues  ? Dysphagia 03/01/2011  ? Fatigue/Loss of Sleep 02/20/2011  ? Generalized headaches 02/20/2011  ? GERD (gastroesophageal reflux disease)   ? History of tonsillectomy   ? Hoarseness/sore throat 02/20/2011  ? Hypertension   ? Hypothyroidism   ? Miscarriage   ? One - date not on medical form.  ?  Mitral valve prolapse 10/23/2012  ? no meds  ? Night sweats 02/20/2011  ? Rash/bruises easily 02/20/2011  ? 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 Rosacea   ? Runny nose 02/20/2011  ? Seasonal allergies   ? uses albuterol inhaler as needed for allergies  ? Sinus problem 02/20/2011  ? SVD (spontaneous vaginal delivery)   ? x 5  ? 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 Wears glasses/contacts 02/20/2011  ? ? ?Past Surgical History:  ?Procedure Laterality Date  ? ABDOMINAL HYSTERECTOMY    ? ANTERIOR AND POSTERIOR REPAIR N/A 02/21/2017  ? Procedure: ANTERIOR (CYSTOCELE) AND POSTERIOR REPAIR (RECTOCELE);  Surgeon:  MCheri Fowler MD;  [facility];  Service: Gynecology;  Laterality: N/A;  ? BILATERAL SALPINGECTOMY N/A 02/21/2017  ? Procedure: BILATERAL SALPINGECTOMY;  Surgeon: MCheri Fowler MD;  [facility];  Service: Gynecology;  Laterality: N/A;  ? BLADDER SUSPENSION N/A 02/21/2017  ? Procedure: Solyx Sling;  Surgeon: MCheri Fowler MD;  [place];  Service: Gynecology;  Laterality: N/A;  ? broken ankle Right   ? Pins/rod inserted-retained  ? COLONOSCOPY    ? DILATION AND CURETTAGE OF UTERUS    ? MAB  ? FRACTURE SURGERY    ? ORIF ANKLE FRACTURE Right 12/19/2018  ? Procedure: Removal of hardware and open reduction internal fixation of right ankle bimalleolar fracture;  Surgeon: HWylene Simmer MD;  [facility]  Service: Orthopedics;  Laterality: Right;  961m [facility]  ? THYROIDECTOMY N/A 10/29/2012  ? Procedure: THYROIDECTOMY;  Surgeon: ToEarnstine RegalMD;  [facility];  Service: General;  Laterality: N/A;  Total Thyroidectomy  ? TONSILLECTOMY    ? VAGINAL HYSTERECTOMY Bilateral 02/21/2017  ? Procedure: HYSTERECTOMY VAGINAL;  Surgeon: MeCheri FowlerMD;  [facility];  Service: Gynecology;  Laterality: Bilateral;  ? WISDOM TOOTH EXTRACTION    ? ? ?Objective:  ? ?Today's Vitals: BP 129/79 (BP Location: Left Arm, Patient Position: Sitting, Cuff Size: Large)   Pulse 75   Temp 98.5 ?F (36.9 ?C) (Temporal)   Ht '5\' 5"'$  (1.651 m)   Wt 185 lb 6 oz (84.1 kg)   LMP 10/12/2012   SpO2 95%   BMI 30.85 kg/m?  ? ?Physical Exam ?Vitals and nursing note reviewed.  ?Constitutional:   ?   Appearance: Normal appearance.  ?Cardiovascular:  ?   Rate and Rhythm: Normal rate and regular rhythm.  ?Pulmonary:  ?   Effort: Pulmonary effort is normal.  ?   Breath sounds: Normal breath sounds.  ?Musculoskeletal:  ?   Lumbar back: Decreased range of motion.  ?Skin: ?   General: Skin is warm and dry.  ?Neurological:  ?   Mental Status: She is alert.  ?Psychiatric:      ?   Mood and Affect: Mood normal.     ?   Behavior: Behavior normal.  ? ? ?Outpatient Encounter Medications as of 01/05/2022  ?Medication Sig  ? albuterol (PROVENTIL HFA;VENTOLIN HFA) 108 (90 Base) MCG/ACT inhaler Inhale into the lungs every 6 (six) hours as needed for wheezing or shortness of breath.  ? ALPRAZolam (XANAX) 0.25 MG tablet Take 0.25 mg by mouth at bedtime as needed for anxiety.  ? cetirizine (ZYRTEC) 10 MG tablet Take 10 mg by mouth daily.  ? diltiazem (DILACOR XR) 120 MG 24 hr capsule Take 120 mg by mouth daily.  ? EPINEPHrine  0.3 mg/0.3 mL IJ SOAJ injection Inject into the muscle.  ? escitalopram (LEXAPRO) 20 MG tablet Take 10 mg by mouth daily.  ? fluticasone (FLONASE) 50 MCG/ACT nasal spray Place into both nostrils daily.  ? levothyroxine (SYNTHROID) 75 MCG tablet Take 137 mcg by mouth.  ? meloxicam (MOBIC) 7.5 MG tablet Take 1 tablet (7.5 mg total) by mouth daily.  ? montelukast (SINGULAIR) 10 MG tablet Take 10 mg by mouth at bedtime.  ? omeprazole (PRILOSEC) 20 MG capsule omeprazole 20 mg capsule,delayed release  ? tiZANidine (ZANAFLEX) 4 MG tablet tizanidine 4 mg tablet  ? zolpidem (AMBIEN) 5 MG tablet Take 5 mg by mouth at bedtime as needed for sleep.  ? [DISCONTINUED] acetaminophen (TYLENOL) 500 MG tablet Take 1,000 mg by mouth every 6 (six) hours as needed.  ? [DISCONTINUED] aspirin EC 81 MG tablet Take 1 tablet (81 mg total) by mouth 2 (two) times daily.  ? [DISCONTINUED] busPIRone (BUSPAR) 5 MG tablet Take 5 mg by mouth 2 (two) times daily as needed (FOR ANXIETY).  ? [DISCONTINUED] docusate sodium (COLACE) 100 MG capsule Take 1 capsule (100 mg total) by mouth 2 (two) times daily. While taking narcotic pain medicine.  ? [DISCONTINUED] EPINEPHrine 0.3 mg/0.3 mL IJ SOAJ injection Inject 0.3 mg into the muscle daily as needed (FOR ANAPHYLATIC ALLERGIC REACTION).   ? [DISCONTINUED] escitalopram (LEXAPRO) 5 MG tablet Take 5 mg by mouth daily.  ? [DISCONTINUED] levothyroxine (SYNTHROID,  LEVOTHROID) 88 MCG tablet Take 88 mcg by mouth daily before breakfast.  ? [DISCONTINUED] naproxen sodium (ALEVE) 220 MG tablet Take 220 mg by mouth.  ? [DISCONTINUED] omeprazole (PRILOSEC) 40 MG capsule Take 40 mg b

## 2022-01-05 NOTE — Assessment & Plan Note (Signed)
fell many years ago and broke her right ankle, also injured left lumbar area, she has had left hip xray in past, but never spine, reports having pain continuously since and takes Ibuprofen '800mg'$  qhs so pain relieved for her to sleep. Starting Mobic, advised on use & SE and to not take with Ibuprofen, sending to [facility] for further workup. ?

## 2022-01-05 NOTE — Patient Instructions (Addendum)
Welcome to [facility]! It was a pleasure meeting you today. ? ?Go to the lab for blood work today.  I will review the results via MyChart in a couple of days. ?I have sent in Meloxicam for you to take for your back pain, ok to take in the am or pm, start with 1 pill to start, ok to increase to 1 pill twice a day, or 2 pills daily if needed.  ?I also sent a referral to our Orthopedic office. ? ?Schedule an office visit in 3 months or sooner for your other medication refills. ? ? ? ?PLEASE NOTE: ? ?If you had any LAB tests please let us know if you have not heard back within a few days. You may see your results on MyChart before we have a chance to review them but we will give you a call once they are reviewed by [place]. If we ordered any REFERRALS today, please let us know if you have not heard from their office within the next week.  ?Let us know through MyChart if you are needing REFILLS, or have your pharmacy send [place] the request. You can also use MyChart to communicate with me or any office staff. ? ?Please try these tips to maintain a healthy lifestyle: ? ?Eat most of your calories during the day when you are active. Eliminate processed foods including packaged sweets (pies, cakes, cookies), reduce intake of potatoes, white bread, white pasta, and white rice. Look for whole grain options, oat flour or almond flour. ? ?Each meal should contain half fruits/vegetables, one quarter protein, and one quarter carbs (no bigger than a computer mouse). ? ?Cut down on sweet beverages. This includes juice, soda, and sweet tea. Also watch fruit intake, though this is a healthier sweet option, it still contains natural sugar! Limit to 3 servings daily. ? ?Drink at least 1 glass of water with each meal and aim for at least 8 glasses per day ? ?Exercise at least 150 minutes every week.  ? ?

## 2022-01-05 NOTE — Assessment & Plan Note (Signed)
Chronic - pt reports having recent med dose changes, TSH 70-80 range and was due to have f/u labs in [place], but had to move back to [place] d/t mother's health. Rechecking today, pt taking 171mg.  ?

## 2022-01-06 ENCOUNTER — Encounter: Payer: Self-pay | Admitting: Family

## 2022-01-06 ENCOUNTER — Other Ambulatory Visit: Payer: Self-pay

## 2022-01-06 MED ORDER — LEVOTHYROXINE SODIUM 75 MCG PO TABS: 137.0000 ug | ORAL_TABLET | Freq: Every day | ORAL | 1 refills | Status: DC

## 2022-01-06 NOTE — Progress Notes (Signed)
Hi Alyssa Fisher,  ? ?Your TSH is very low, usually indicating your actual thyroid hormone level is too high, but as you can see your free T4 number is right in the middle where it should be. So, again, surgical removal of the thyroid can cause erratic TSH numbers. ? ?Any questions, let me know!

## 2022-01-12 ENCOUNTER — Ambulatory Visit (INDEPENDENT_AMBULATORY_CARE_PROVIDER_SITE_OTHER): Payer: Medicare Other | Admitting: Orthopaedic Surgery

## 2022-01-12 DIAGNOSIS — M533 Sacrococcygeal disorders, not elsewhere classified: Secondary | ICD-10-CM | POA: Diagnosis not present

## 2022-01-12 DIAGNOSIS — G8929 Other chronic pain: Secondary | ICD-10-CM

## 2022-01-12 NOTE — Progress Notes (Signed)
? ?                            ? ? ?Chief Complaint: Left hip lower back pain ?  ? ? ?History of Present Illness:  ? ? ?Alyssa Fisher is a 56 y.o. female presents with over 10 years of left-sided back pain with some radiation down the thigh.  She has previously taken tramadol and Mobic for this which does help her.  She has undergone 2 rounds of physical therapy for several months with minimal relief.  She has not had any injections.  She did have a fall down the steps in 2011 at which time the pain began on the left side.  She describes it as a pinching feeling.  She is having a hard time laying directly on the side.  She is very active with her children and grandchildren.  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 ? ? ? ?Surgical History:   ?None ? ?PMH/PSH/Family History/Social History/Meds/Allergies:   ? ?Past Medical History:  ?Diagnosis Date  ? Anxiety   ? Broken ankle   ? Chronic left-sided lumbar radiculopathy 01/05/2022  ? Depression   ? Dizziness - light-headed/fainting 02/20/2011  ? Dysphagia 10/23/2012  ? due to thyroid issues  ? Dysphagia 03/01/2011  ? Fatigue/Loss of Sleep 02/20/2011  ? FHx: allergies 02/20/2011  ? Ask patient to clarify. Only had "allergies" noted. Need more detail per medical history form dated 01/18/11.  ? Generalized headaches 02/20/2011  ? GERD (gastroesophageal reflux disease)   ? History of tonsillectomy   ? Hoarseness/sore throat 02/20/2011  ? Hypertension   ? Miscarriage   ? One - date not on medical form.  ? Mitral valve prolapse 10/23/2012  ? no meds  ? Night sweats 02/20/2011  ? Rash/bruises easily 02/20/2011  ? 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 Rosacea   ? Runny nose 02/20/2011  ? Seasonal allergies   ? uses albuterol inhaler as needed for allergies  ? Sinus problem 02/20/2011  ? 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 multinodular- has been followed x 67yr  ? Wears glasses/contacts 02/20/2011  ? ?Past Surgical History:  ?Procedure  Laterality Date  ? ABDOMINAL HYSTERECTOMY    ? ANTERIOR AND POSTERIOR REPAIR N/A 02/21/2017  ? Procedure: ANTERIOR (CYSTOCELE) AND POSTERIOR REPAIR (RECTOCELE);  Surgeon: MCheri Fowler MD;  [facility];  Service: Gynecology;  Laterality: N/A;  ? BILATERAL SALPINGECTOMY N/A 02/21/2017  ? Procedure: BILATERAL SALPINGECTOMY;  Surgeon: MCheri Fowler MD;  [facility];  Service: Gynecology;  Laterality: N/A;  ? BLADDER SUSPENSION N/A 02/21/2017  ? Procedure: Solyx Sling;  Surgeon: MCheri Fowler MD;  [place];  Service: Gynecology;  Laterality: N/A;  ? broken ankle Right   ? Pins/rod inserted-retained  ? COLONOSCOPY    ? DILATION AND CURETTAGE OF UTERUS    ? MAB  ? FRACTURE SURGERY    ? ORIF ANKLE FRACTURE Right 12/19/2018  ? Procedure: Removal of hardware and open reduction internal fixation of right ankle bimalleolar fracture;  Surgeon: HWylene Simmer MD;  [place]  Service: Orthopedics;  Laterality: Right;  926m [facility]  ? THYROIDECTOMY N/A 10/29/2012  ? Procedure: THYROIDECTOMY;  Surgeon: ToEarnstine RegalMD;  [facility];  Service: General;  Laterality: N/A;  Total Thyroidectomy  ? TONSILLECTOMY    ?  VAGINAL HYSTERECTOMY Bilateral 02/21/2017  ? Procedure: HYSTERECTOMY VAGINAL;  Surgeon: Cheri Fowler, MD;  [facility];  Service: Gynecology;  Laterality: Bilateral;  ? WISDOM TOOTH EXTRACTION    ? ?Social History  ? ?Socioeconomic History  ? Marital status: Legally Separated  ?  Spouse name: Not on file  ? Number of children: 5  ? Years of education: Not on file  ? Highest education level: Not on file  ?Occupational History  ? Not on file  ?Tobacco Use  ? Smoking status: Never  ? Smokeless tobacco: Never  ?Vaping Use  ? Vaping Use: Never used  ?Substance and Sexual Activity  ? Alcohol use: Not Currently  ?  Comment: rare -social  ? Drug use: No  ? Sexual activity: Not Currently  ?  Birth control/protection: Abstinence,  Condom, Post-menopausal  ?Other Topics Concern  ? Not on file  ?Social History Narrative  ? Not on file  ? ?Social Determinants of Health  ? ?Financial Resource Strain: Not on file  ?Food Insecurity: Not on file  ?Transportation Needs: Not on file  ?Physical Activity: Not on file  ?Stress: Not on file  ?Social Connections: Not on file  ? ?Family History  ?Problem Relation Age of Onset  ? Arthritis Mother   ? Cancer Mother   ? Heart disease Mother   ? Hypertension Mother   ? Kidney disease Mother   ? Obesity Mother   ? Heart disease Father   ? Alcohol abuse Father   ? Vision loss Father   ? Colon polyps Maternal Grandmother   ? ?Allergies  ?Allergen Reactions  ? Codeine   ?  Patient stated "possible" due to mother and grandparents being allergic as well. Per medical history form dated 01/18/11. Prefers not to have.  ? ?Current Outpatient Medications  ?Medication Sig Dispense Refill  ? albuterol (PROVENTIL HFA;VENTOLIN HFA) 108 (90 Base) MCG/ACT inhaler Inhale into the lungs every 6 (six) hours as needed for wheezing or shortness of breath.    ? ALPRAZolam (XANAX) 0.25 MG tablet Take 0.25 mg by mouth at bedtime as needed for anxiety.    ? cetirizine (ZYRTEC) 10 MG tablet Take 10 mg by mouth daily.    ? diltiazem (DILACOR XR) 120 MG 24 hr capsule Take 120 mg by mouth daily.    ? EPINEPHrine 0.3 mg/0.3 mL IJ SOAJ injection Inject into the muscle.    ? escitalopram (LEXAPRO) 20 MG tablet Take 10 mg by mouth daily.    ? fluticasone (FLONASE) 50 MCG/ACT nasal spray Place into both nostrils daily.    ? levothyroxine (SYNTHROID) 75 MCG tablet Take 2 tablets (150 mcg total) by mouth daily before breakfast. 90 tablet 1  ? meloxicam (MOBIC) 7.5 MG tablet Take 1 tablet (7.5 mg total) by mouth daily. 30 tablet 0  ? montelukast (SINGULAIR) 10 MG tablet Take 10 mg by mouth at bedtime.    ? omeprazole (PRILOSEC) 20 MG capsule omeprazole 20 mg capsule,delayed release    ? tiZANidine (ZANAFLEX) 4 MG tablet tizanidine 4 mg tablet    ?  zolpidem (AMBIEN) 5 MG tablet Take 5 mg by mouth at bedtime as needed for sleep.    ? ?No current facility-administered medications for this visit.  ? ?No results found. ? ?Review of Systems:   ?A ROS was performed including pertinent positives and negatives as documented in the HPI. ? ?Physical Exam :   ?Constitutional: NAD and appears stated age ?Neurological: Alert and oriented ?Psych: Appropriate affect and  cooperative ?Last menstrual period 10/12/2012.  ? ?Comprehensive Musculoskeletal Exam:   ? ?Tenderness palpation about the left SI joint.  Negative straight leg raise.  No radiation or weakness in the left lower leg.  Reflexes symmetric and equal.  Remainder of neurosensory exam is intact ? ?Imaging:   ? ?I personally reviewed and interpreted the radiographs. ? ? ?Assessment:   ?56 y.o. female with left-sided lower back pain in my opinion more consistent with SI joint type pain.  To this effect I recommended a MRI as well as a referral to Dr. Ernestina Patches for a left SI injection.  I described that the injection will be very helpful for [place] that we can determine if the etiology of her pain is more SI joint versus lower back.  With regard to her radiation down the left leg she describes it more as a warmness as opposed to a shooting or numbness which is leading me more to suspect the SI joint as her pain generator.  We also plan for an MRI of her lumbar spine this is been going on for nearly a decade now and she has failed multiple rounds of physical therapy as well as NSAIDs. ? ?Plan :   ? ?-Plan for MRI lumbar spine ?-Plan for referral to Dr. Ernestina Patches for left SI injection ?-Return to clinic 6 weeks for reassessment ? ? ? ? ?I personally saw and evaluated the patient, and participated in the management and treatment plan. ? ?Vanetta Mulders, MD ?Attending Physician, Orthopedic Surgery ? ?This document was dictated using Systems analyst. A reasonable attempt at proof reading has been made to minimize  errors. ?

## 2022-01-20 ENCOUNTER — Encounter: Payer: Self-pay | Admitting: Family

## 2022-01-20 ENCOUNTER — Telehealth: Payer: Self-pay | Admitting: Family

## 2022-01-20 ENCOUNTER — Other Ambulatory Visit: Payer: Self-pay

## 2022-01-20 ENCOUNTER — Ambulatory Visit (INDEPENDENT_AMBULATORY_CARE_PROVIDER_SITE_OTHER): Payer: Medicare Other | Admitting: Family

## 2022-01-20 VITALS — BP 119/81 | HR 73 | Temp 98.1°F | Ht 65.0 in | Wt 183.5 lb

## 2022-01-20 DIAGNOSIS — J0110 Acute frontal sinusitis, unspecified: Secondary | ICD-10-CM

## 2022-01-20 DIAGNOSIS — M5416 Radiculopathy, lumbar region: Secondary | ICD-10-CM

## 2022-01-20 DIAGNOSIS — F5101 Primary insomnia: Secondary | ICD-10-CM | POA: Diagnosis not present

## 2022-01-20 DIAGNOSIS — B379 Candidiasis, unspecified: Secondary | ICD-10-CM

## 2022-01-20 DIAGNOSIS — T3695XA Adverse effect of unspecified systemic antibiotic, initial encounter: Secondary | ICD-10-CM

## 2022-01-20 MED ORDER — FLUCONAZOLE 150 MG PO TABS: ORAL_TABLET | ORAL | 0 refills | Status: DC

## 2022-01-20 MED ORDER — ZOLPIDEM TARTRATE 5 MG PO TABS: 5.0000 mg | ORAL_TABLET | Freq: Every evening | ORAL | 5 refills | Status: DC | PRN

## 2022-01-20 MED ORDER — MELOXICAM 7.5 MG PO TABS: 7.5000 mg | ORAL_TABLET | Freq: Two times a day (BID) | ORAL | 2 refills | Status: DC

## 2022-01-20 MED ORDER — AMOXICILLIN-POT CLAVULANATE 875-125 MG PO TABS: 1.0000 | ORAL_TABLET | Freq: Two times a day (BID) | ORAL | 0 refills | Status: DC

## 2022-01-20 NOTE — Telephone Encounter (Signed)
4 Rxs sent today (05/19) went to [facility] and pt states she would like them to be sent to the pharmacy listed below.      [place] [id number] [id number] Phone:  [phone] Fax:  [phone]   meloxicam (MOBIC) 7.5 MG tablet [[[id number]]  zolpidem (AMBIEN) 5 MG tablet [[[id number]]  amoxicillin-clavulanate (AUGMENTIN) 875-125 MG tablet [[[id number]]  fluconazole (DIFLUCAN) 150 MG tablet [[[id number]]

## 2022-01-20 NOTE — Assessment & Plan Note (Signed)
seen by [facility], awaiting imaging, states Mobic is helping, refilling today.

## 2022-01-20 NOTE — Progress Notes (Signed)
Subjective:     Patient ID: Alyssa Fisher, female    DOB: [birth year], 56 y.o.   MRN: [id number]  Chief Complaint  Patient presents with   Sinus Problem    Pt c/o sinus pressure behind eyes, coughing up clear and green, nasal congestion for about a week. Has tried tylenol, mucinex which did work a little.    HPI: Sinusitis: Patient complains of congestion, facial pain, headache described as frontal, nasal congestion, purulent nasal discharge, and sinus pressure, with no fever, chills, night sweats or weight loss. Onset of symptoms was 7 days ago, gradually worsening since that time. She is drinking moderate amounts of fluids.  Past history is significant for no history of pneumonia or bronchitis. Patient is non-smoker. INSOMNIA:  How long: years. Difficulty initiating sleep: yes.  Difficulty maintaining sleep: yes.  OTC meds tried: yes. RX meds in past: yes, Ambien. Sleep hygiene measures: yes. Started any new meds recently: no. Shift worker: no. New stressors: yes, sinus infection.  Pain She reports chronic lumbar pain. There was an injury that may have caused the pain. The pain started years ago and is gradually worsening. The pain does not radiate. The pain is described as aching, burning, and soreness, is severe in intensity, occurring constantly. Symptoms are worse in the: all the time.Aggravating factors: sitting, standing, walking, and walking uphill She has tried application of heat, application of ice, acetaminophen, NSAIDs, and prescription pain relievers with mild relief. Reports good relief with Mobic, would like a refill.  Assessment & Plan:   Problem List Items Addressed This Visit       Nervous and Auditory   Chronic left-sided lumbar radiculopathy    seen by ORTHO, awaiting imaging, states Mobic is helping, refilling today.       Relevant Medications   meloxicam (MOBIC) 7.5 MG tablet   zolpidem (AMBIEN) 5 MG tablet     Other   Primary insomnia    Chronic - stable on  Ambien, refilling today, f/u in 6 mos.       Relevant Medications   zolpidem (AMBIEN) 5 MG tablet   Other Visit Diagnoses     Acute non-recurrent frontal sinusitis    -  Primary   Sending Augmentin, advised on use & SE. Increase Flonase & OTC antihistamine to bid for next week. Advised on using saline nasal spray tid and prior to Flonase. Increase hydration to 2L qd.  Relevant Medications   amoxicillin-clavulanate (AUGMENTIN) 875-125 MG tablet   fluconazole (DIFLUCAN) 150 MG tablet   Antibiotic-induced yeast infection       Relevant Medications   fluconazole (DIFLUCAN) 150 MG tablet      Outpatient Medications Prior to Visit  Medication Sig Dispense Refill   albuterol (PROVENTIL HFA;VENTOLIN HFA) 108 (90 Base) MCG/ACT inhaler Inhale into the lungs every 6 (six) hours as needed for wheezing or shortness of breath.     ALPRAZolam (XANAX) 0.25 MG tablet Take 0.25 mg by mouth at bedtime as needed for anxiety.     cetirizine (ZYRTEC) 10 MG tablet Take 10 mg by mouth daily.     diltiazem (DILACOR XR) 120 MG 24 hr capsule Take 120 mg by mouth daily.     escitalopram (LEXAPRO) 20 MG tablet Take 10 mg by mouth daily.     fluticasone (FLONASE) 50 MCG/ACT nasal spray Place into both nostrils daily.     levothyroxine (SYNTHROID) 75 MCG tablet Take 2 tablets (150 mcg total) by mouth daily before breakfast. 90 tablet  1   montelukast (SINGULAIR) 10 MG tablet Take 10 mg by mouth at bedtime.     omeprazole (PRILOSEC) 20 MG capsule omeprazole 20 mg capsule,delayed release     tiZANidine (ZANAFLEX) 4 MG tablet tizanidine 4 mg tablet     meloxicam (MOBIC) 7.5 MG tablet Take 1 tablet (7.5 mg total) by mouth daily. 30 tablet 0   zolpidem (AMBIEN) 5 MG tablet Take 5 mg by mouth at bedtime as needed for sleep.     EPINEPHrine 0.3 mg/0.3 mL IJ SOAJ injection Inject into the muscle.     No facility-administered medications prior to visit.    Past Medical History:  Diagnosis Date   Anxiety    Broken  ankle    Chronic left-sided lumbar radiculopathy 01/05/2022   Depression    Dizziness - light-headed/fainting 02/20/2011   Dysphagia 10/23/2012   due to thyroid issues   Dysphagia 03/01/2011   Fatigue/Loss of Sleep 02/20/2011   FHx: allergies 02/20/2011   Ask patient to clarify. Only had "allergies" noted. Need more detail per medical history form dated 01/18/11.   Generalized headaches 02/20/2011   GERD (gastroesophageal reflux disease)    History of tonsillectomy    Hoarseness/sore throat 02/20/2011   Hypertension    Miscarriage    One - date not on medical form.   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   Rosacea    Runny nose 02/20/2011   Seasonal allergies    uses albuterol inhaler as needed for allergies   Sinus problem 02/20/2011   Thyroid goiter 10/23/2012   multinodular- has been followed x 30yr   Wears glasses/contacts 02/20/2011    Past Surgical History:  Procedure Laterality Date   ABDOMINAL HYSTERECTOMY     ANTERIOR AND POSTERIOR REPAIR N/A 02/21/2017   Procedure: ANTERIOR (CYSTOCELE) AND POSTERIOR REPAIR (RECTOCELE);  Surgeon: MCheri Fowler MD;  [facility];  Service: Gynecology;  Laterality: N/A;   BILATERAL SALPINGECTOMY N/A 02/21/2017   Procedure: BILATERAL SALPINGECTOMY;  Surgeon: MCheri Fowler MD;  [facility];  Service: Gynecology;  Laterality: N/A;   BLADDER SUSPENSION N/A 02/21/2017   Procedure: Solyx Sling;  Surgeon: MCheri Fowler MD;  [facility];  Service: Gynecology;  Laterality: N/A;   broken ankle Right    Pins/rod inserted-retained   COLONOSCOPY     DILATION AND CURETTAGE OF UTERUS     MAB   FRACTURE SURGERY     ORIF ANKLE FRACTURE Right 12/19/2018   Procedure: Removal of hardware and open reduction internal fixation of right ankle bimalleolar fracture;  Surgeon: HWylene Simmer MD;  [facility]  Service: Orthopedics;  Laterality: Right;  [facility]   THYROIDECTOMY N/A 10/29/2012   Procedure: THYROIDECTOMY;  Surgeon: ToEarnstine RegalMD;  [facility];  Service: General;  Laterality: N/A;  Total Thyroidectomy   TONSILLECTOMY     VAGINAL HYSTERECTOMY Bilateral 02/21/2017   Procedure: HYSTERECTOMY VAGINAL;  Surgeon: MeCheri FowlerMD;  [facility];  Service: Gynecology;  Laterality: Bilateral;   WISDOM TOOTH EXTRACTION      Allergies  Allergen Reactions   Codeine     Patient stated "possible" due to mother and grandparents being allergic as well. Per medical history form dated 01/18/11. Prefers not to have.       Objective:  Physical Exam Vitals and nursing note reviewed.  Constitutional:      Appearance: Normal appearance.  HENT:     Right Ear: Tympanic membrane and ear canal normal.     Left Ear: Tympanic membrane and ear canal normal.     Nose:     Right Sinus: Frontal sinus tenderness present. No maxillary sinus tenderness.     Left Sinus: Frontal sinus tenderness present. No maxillary sinus tenderness.     Mouth/Throat:     Mouth: Mucous membranes are moist.     Pharynx: Posterior oropharyngeal erythema present. No pharyngeal swelling, oropharyngeal exudate or uvula swelling.     Tonsils: 0 on the right. 0 on the left.  Cardiovascular:     Rate and Rhythm: Normal rate and regular rhythm.  Pulmonary:     Effort: Pulmonary effort is normal.     Breath sounds: Normal breath sounds.  Musculoskeletal:        General: Normal range of motion.  Skin:    General: Skin is warm and dry.  Neurological:     Mental Status: She is alert.  Psychiatric:        Mood and Affect: Mood normal.        Behavior: Behavior normal.    BP 119/81 (BP Location: Left Arm, Patient Position: Sitting, Cuff Size: Large)   Pulse 73   Temp 98.1 F (36.7 C) (Temporal)   Ht '5\' 5"'$  (1.651 m)   Wt 183 lb 8 oz (83.2 kg)   LMP  10/12/2012   SpO2 99%   BMI 30.54 kg/m  Wt Readings from Last 3 Encounters:  01/20/22 183 lb 8 oz (83.2 kg)  01/05/22 185 lb 6 oz (84.1 kg)  12/19/18 169 lb 15.6 oz (77.1 kg)        Meds ordered this encounter  Medications   meloxicam (MOBIC) 7.5 MG tablet    Sig: Take 1 tablet (7.5 mg total) by mouth in the morning and at bedtime.    Dispense:  60 tablet    Refill:  2   zolpidem (AMBIEN) 5 MG tablet    Sig: Take 1 tablet (5 mg total) by mouth at bedtime as needed for sleep.    Dispense:  30 tablet    Refill:  5   amoxicillin-clavulanate (AUGMENTIN) 875-125 MG tablet    Sig: Take 1 tablet by mouth 2 (two) times daily after a meal.    Dispense:  14 tablet    Refill:  0    Order Specific Question:   Supervising Provider    Answer:   ANDY, CAMILLE L [[id number]]   fluconazole (DIFLUCAN) 150 MG tablet    Sig: Take 1 pill today, and 2nd pill in 3 days.    Dispense:  2 tablet    Refill:  0    Order Specific Question:   Supervising Provider    Answer:   ANDY, CAMILLE L [[id number]]    Jeanie Sewer, NP

## 2022-01-20 NOTE — Telephone Encounter (Signed)
ok to resend all, thanks

## 2022-01-20 NOTE — Assessment & Plan Note (Addendum)
Chronic - stable on Ambien, refilling today, f/u in 6 mos.

## 2022-01-20 NOTE — Patient Instructions (Addendum)
It was very nice to see you today!  I have sent an antibiotic, Augmentin to your pharmacy, take this after eating. Increase your Flonase nasal spray to 2 sprays each side twice a day for the next week and then reduce to daily use.  OK to also take generic Xyzal or Zyrtec 1-2 times per day for the next week, then reduce to 1 pill daily. Drink at least 2 liters of water daily.    PLEASE NOTE:  If you had any lab tests please let us know if you have not heard back within a few days. You may see your results on MyChart before we have a chance to review them but we will give you a call once they are reviewed by [place]. If we ordered any referrals today, please let us know if you have not heard from their office within the next week.

## 2022-01-22 ENCOUNTER — Other Ambulatory Visit: Payer: Medicare Other

## 2022-01-23 ENCOUNTER — Other Ambulatory Visit: Payer: Self-pay

## 2022-01-23 ENCOUNTER — Encounter: Payer: Self-pay | Admitting: Family

## 2022-01-23 DIAGNOSIS — J0110 Acute frontal sinusitis, unspecified: Secondary | ICD-10-CM

## 2022-01-23 MED ORDER — DOXYCYCLINE HYCLATE 100 MG PO TABS: 100.0000 mg | ORAL_TABLET | Freq: Two times a day (BID) | ORAL | 0 refills | Status: DC

## 2022-01-28 ENCOUNTER — Ambulatory Visit
Admission: RE | Admit: 2022-01-28 | Discharge: 2022-01-28 | Disposition: A | Discharge status: Home / Self Care | Payer: Medicare Other | Source: Ambulatory Visit | Attending: Orthopaedic Surgery | Admitting: Orthopaedic Surgery

## 2022-01-28 DIAGNOSIS — G8929 Other chronic pain: Secondary | ICD-10-CM

## 2022-01-28 DIAGNOSIS — M5136 Other intervertebral disc degeneration, lumbar region: Secondary | ICD-10-CM | POA: Diagnosis not present

## 2022-01-28 DIAGNOSIS — M545 Low back pain, unspecified: Secondary | ICD-10-CM | POA: Diagnosis not present

## 2022-02-01 ENCOUNTER — Encounter: Payer: Self-pay | Admitting: Family

## 2022-02-01 ENCOUNTER — Ambulatory Visit (INDEPENDENT_AMBULATORY_CARE_PROVIDER_SITE_OTHER): Payer: Medicare Other | Admitting: Family

## 2022-02-01 VITALS — BP 107/70 | HR 73 | Temp 98.4°F | Ht 65.0 in | Wt 183.2 lb

## 2022-02-01 DIAGNOSIS — I10 Essential (primary) hypertension: Secondary | ICD-10-CM | POA: Diagnosis not present

## 2022-02-01 DIAGNOSIS — F419 Anxiety disorder, unspecified: Secondary | ICD-10-CM

## 2022-02-01 DIAGNOSIS — F5101 Primary insomnia: Secondary | ICD-10-CM | POA: Diagnosis not present

## 2022-02-01 DIAGNOSIS — Z23 Encounter for immunization: Secondary | ICD-10-CM | POA: Diagnosis not present

## 2022-02-01 MED ORDER — ALPRAZOLAM 0.25 MG PO TABS: 0.2500 mg | ORAL_TABLET | Freq: Every day | ORAL | 2 refills | Status: DC | PRN

## 2022-02-01 MED ORDER — ZOLPIDEM TARTRATE 5 MG PO TABS: 5.0000 mg | ORAL_TABLET | Freq: Every evening | ORAL | 5 refills | Status: DC | PRN

## 2022-02-01 MED ORDER — DILTIAZEM HCL ER 120 MG PO CP24: 120.0000 mg | ORAL_CAPSULE | Freq: Every day | ORAL | 1 refills | Status: DC

## 2022-02-01 NOTE — Assessment & Plan Note (Signed)
Chronic - stable on Diltiazem, refilling today, f/u in 6 mos.

## 2022-02-01 NOTE — Assessment & Plan Note (Signed)
Chronic - stable on Ambien, refilling today, f/u in 6 mos.

## 2022-02-01 NOTE — Progress Notes (Signed)
Subjective:     Patient ID: Alyssa Fisher, female    DOB: 04/05/1966, 56 y.o.   MRN: [id number]  Chief Complaint  Patient presents with   Follow-up    Medication refills    HPI: INSOMNIA:  How long: years. Difficulty initiating sleep: yes.  Difficulty maintaining sleep: yes.  OTC meds tried: none. RX meds in past: Ambien. Sleep hygiene measures: yes. Started any new meds recently: no. Shift worker: no. New stressors: separating from husband.  Anxiety/Depression: Patient complains of anxiety disorder.   She has the following symptoms: none.  Onset of symptoms was approximately  years ago, She denies current suicidal and homicidal ideation.  Possible organic causes contributing are: none.  Risk factors: none. Previous treatment includes Lexapro and Xanax and individual therapy.  She complains of the following side effects from the treatment: none.    01/05/2022   10:38 AM  Depression screen PHQ 2/9  Decreased Interest 0  Down, Depressed, Hopeless 0  PHQ - 2 Score 0   Hypertension: Patient is currently maintained on the following medications for blood pressure: Diltiazem Failed meds include: none. Patient reports good compliance with blood pressure medications. Patient denies chest pain, headaches, shortness of breath or swelling. Last 3 blood pressure readings in our office are as follows: BP Readings from Last 3 Encounters:  02/01/22 107/70  01/20/22 119/81  01/05/22 129/79    Assessment & Plan:   Problem List Items Addressed This Visit       Cardiovascular and Mediastinum   Essential hypertension    Chronic - stable on Diltiazem, refilling today, f/u in 6 mos.       Relevant Medications   diltiazem (DILACOR XR) 120 MG 24 hr capsule     Other   Primary insomnia    Chronic - stable on Ambien, refilling today, f/u in 6 mos.       Relevant Medications   zolpidem (AMBIEN) 5 MG tablet   Anxiety - Primary    Chronic - stable on Lexapro and Xanax prn, refilling  today, f/u 6 mos.       Relevant Medications   ALPRAZolam (XANAX) 0.25 MG tablet   Other Visit Diagnoses     Need for prophylactic vaccination with combined diphtheria-tetanus-pertussis (DTP) vaccine       Relevant Orders   Tdap vaccine greater than or equal to 7yo IM (Completed)       Outpatient Medications Prior to Visit  Medication Sig Dispense Refill   albuterol (PROVENTIL HFA;VENTOLIN HFA) 108 (90 Base) MCG/ACT inhaler Inhale into the lungs every 6 (six) hours as needed for wheezing or shortness of breath.     cetirizine (ZYRTEC) 10 MG tablet Take 10 mg by mouth daily.     escitalopram (LEXAPRO) 20 MG tablet Take 10 mg by mouth daily.     fluticasone (FLONASE) 50 MCG/ACT nasal spray Place into both nostrils daily.     levothyroxine (SYNTHROID) 75 MCG tablet Take 2 tablets (150 mcg total) by mouth daily before breakfast. 90 tablet 1   meloxicam (MOBIC) 7.5 MG tablet Take 1 tablet (7.5 mg total) by mouth in the morning and at bedtime. 60 tablet 2   montelukast (SINGULAIR) 10 MG tablet Take 10 mg by mouth at bedtime.     omeprazole (PRILOSEC) 20 MG capsule omeprazole 20 mg capsule,delayed release     tiZANidine (ZANAFLEX) 4 MG tablet tizanidine 4 mg tablet     ALPRAZolam (XANAX) 0.25 MG tablet Take 0.25 mg by mouth  at bedtime as needed for anxiety.     diltiazem (DILACOR XR) 120 MG 24 hr capsule Take 120 mg by mouth daily.     doxycycline (VIBRA-TABS) 100 MG tablet Take 1 tablet (100 mg total) by mouth 2 (two) times daily. 10 tablet 0   fluconazole (DIFLUCAN) 150 MG tablet Take 1 pill today, and 2nd pill in 3 days. 2 tablet 0   zolpidem (AMBIEN) 5 MG tablet Take 1 tablet (5 mg total) by mouth at bedtime as needed for sleep. 30 tablet 5   EPINEPHrine 0.3 mg/0.3 mL IJ SOAJ injection Inject into the muscle.     No facility-administered medications prior to visit.    Past Medical History:  Diagnosis Date   Anxiety    Broken ankle    Chronic left-sided lumbar radiculopathy  01/05/2022   Contraceptive management 11/21/2012   Depression    Dizziness - light-headed/fainting 02/20/2011   Dysphagia 10/23/2012   due to thyroid issues   Dysphagia 03/01/2011   Fatigue/Loss of Sleep 02/20/2011   FHx: allergies 02/20/2011   Ask patient to clarify. Only had "allergies" noted. Need more detail per medical history form dated 01/18/11.   Generalized headaches 02/20/2011   GERD (gastroesophageal reflux disease)    History of tonsillectomy    Hoarseness/sore throat 02/20/2011   Hypertension    Miscarriage    One - date not on medical form.   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- has been followed x 73yr   Wears glasses/contacts 02/20/2011    Past Surgical History:  Procedure Laterality Date   ABDOMINAL HYSTERECTOMY     ANTERIOR AND POSTERIOR REPAIR N/A 02/21/2017   Procedure: ANTERIOR (CYSTOCELE) AND POSTERIOR REPAIR (RECTOCELE);  Surgeon: MCheri Fowler MD;  [facility];  Service: Gynecology;  Laterality: N/A;   BILATERAL SALPINGECTOMY N/A 02/21/2017   Procedure: BILATERAL SALPINGECTOMY;  Surgeon: MCheri Fowler MD;  [facility];  Service: Gynecology;  Laterality: N/A;   BLADDER SUSPENSION N/A 02/21/2017   Procedure: Solyx Sling;  Surgeon: MCheri Fowler MD;  [facility];  Service: Gynecology;  Laterality: N/A;   broken ankle Right    Pins/rod inserted-retained   COLONOSCOPY     DILATION AND CURETTAGE OF UTERUS     MAB   FRACTURE SURGERY     ORIF ANKLE FRACTURE Right 12/19/2018   Procedure: Removal of hardware and open reduction internal fixation of right ankle bimalleolar fracture;  Surgeon: HWylene Simmer MD;  [facility]  Service: Orthopedics;  Laterality: Right;  [facility]46[facility]   THYROIDECTOMY N/A 10/29/2012   Procedure: THYROIDECTOMY;  Surgeon: ToEarnstine RegalMD;  [facility];  Service: General;  Laterality: N/A;  Total Thyroidectomy   TONSILLECTOMY     VAGINAL HYSTERECTOMY Bilateral 02/21/2017   Procedure: HYSTERECTOMY VAGINAL;  Surgeon: MeCheri FowlerMD;  [facility];  Service: Gynecology;  Laterality: Bilateral;   WISDOM TOOTH EXTRACTION      Allergies  Allergen Reactions   Augmentin [Amoxicillin-Pot Clavulanate] Diarrhea   Codeine     Patient stated "possible" due to mother and  grandparents being allergic as well. Per medical history form dated 01/18/11. Prefers not to have.       Objective:    Physical Exam Vitals and nursing note reviewed.  Constitutional:      Appearance: Normal appearance.  Eyes:     General: Visual field deficit (retinitis pigmentosa - bilaterally) present.     Extraocular Movements:     Right eye: Abnormal extraocular motion (lazy right eye) present.  Cardiovascular:     Rate and Rhythm: Normal rate and regular rhythm.  Pulmonary:     Effort: Pulmonary effort is normal.     Breath sounds: Normal breath sounds.  Musculoskeletal:        General: Normal range of motion.  Skin:    General: Skin is warm and dry.  Neurological:     Mental Status: She is alert.  Psychiatric:        Mood and Affect: Mood normal.        Behavior: Behavior normal.    BP 107/70 (BP Location: Left Arm, Patient Position: Sitting, Cuff Size: Large)   Pulse 73   Temp 98.4 F (36.9 C) (Temporal)   Ht '5\' 5"'$  (1.651 m)   Wt 183 lb 4 oz (83.1 kg)   LMP 10/12/2012   SpO2 96%   BMI 30.49 kg/m  Wt Readings from Last 3 Encounters:  02/01/22 183 lb 4 oz (83.1 kg)  01/20/22 183 lb 8 oz (83.2 kg)  01/05/22 185 lb 6 oz (84.1 kg)        Meds ordered this encounter  Medications   ALPRAZolam (XANAX) 0.25 MG tablet     Sig: Take 1 tablet (0.25 mg total) by mouth daily as needed for anxiety.    Dispense:  30 tablet    Refill:  2   zolpidem (AMBIEN) 5 MG tablet    Sig: Take 1 tablet (5 mg total) by mouth at bedtime as needed for sleep.    Dispense:  30 tablet    Refill:  5   diltiazem (DILACOR XR) 120 MG 24 hr capsule    Sig: Take 1 capsule (120 mg total) by mouth daily.    Dispense:  90 capsule    Refill:  1    Jeanie Sewer, NP

## 2022-02-01 NOTE — Assessment & Plan Note (Signed)
Chronic - stable on Lexapro and Xanax prn, refilling today, f/u 6 mos.

## 2022-02-08 DIAGNOSIS — H524 Presbyopia: Secondary | ICD-10-CM | POA: Diagnosis not present

## 2022-02-08 DIAGNOSIS — H52223 Regular astigmatism, bilateral: Secondary | ICD-10-CM | POA: Diagnosis not present

## 2022-02-08 DIAGNOSIS — H3552 Pigmentary retinal dystrophy: Secondary | ICD-10-CM | POA: Diagnosis not present

## 2022-02-08 DIAGNOSIS — H25043 Posterior subcapsular polar age-related cataract, bilateral: Secondary | ICD-10-CM | POA: Diagnosis not present

## 2022-02-09 ENCOUNTER — Ambulatory Visit (INDEPENDENT_AMBULATORY_CARE_PROVIDER_SITE_OTHER): Payer: Medicare Other | Admitting: Orthopaedic Surgery

## 2022-02-09 DIAGNOSIS — G8929 Other chronic pain: Secondary | ICD-10-CM

## 2022-02-09 DIAGNOSIS — M533 Sacrococcygeal disorders, not elsewhere classified: Secondary | ICD-10-CM

## 2022-02-09 NOTE — Progress Notes (Signed)
Chief Complaint: Left hip lower back pain     History of Present Illness:   02/09/2022: Presents today for follow-up of her lumbar spine MRI review.  Continues to have left-sided predominantly SI type pain.  This is all been occurring since the fall.  She is scheduled to undergo injection with Dr. Ernestina Patches the following week.  Alyssa Fisher is a 56 y.o. female presents with over 10 years of left-sided back pain with some radiation down the thigh.  She has previously taken tramadol and Mobic for this which does help her.  She has undergone 2 rounds of physical therapy for several months with minimal relief.  She has not had any injections.  She did have a fall down the steps in 2011 at which time the pain began on the left side.  She describes it as a pinching feeling.  She is having a hard time laying directly on the side.  She is very active with her children and grandchildren.  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    Surgical History:   None  PMH/PSH/Family History/Social History/Meds/Allergies:    Past Medical History:  Diagnosis Date   Anxiety    Broken ankle    Chronic left-sided lumbar radiculopathy 01/05/2022   Contraceptive management 11/21/2012   Depression    Dizziness - light-headed/fainting 02/20/2011   Dysphagia 10/23/2012   due to thyroid issues   Dysphagia 03/01/2011   Fatigue/Loss of Sleep 02/20/2011   FHx: allergies 02/20/2011   Ask patient to clarify. Only had "allergies" noted. Need more detail per medical history form dated 01/18/11.   Generalized headaches 02/20/2011   GERD (gastroesophageal reflux disease)    History of tonsillectomy    Hoarseness/sore throat 02/20/2011   Hypertension    Miscarriage    One - date not on medical form.   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   Rosacea    Runny nose 02/20/2011   Seasonal allergies    uses albuterol inhaler as needed for allergies   Sinus problem 02/20/2011   Thyroid goiter 10/23/2012   multinodular- has been followed x 81yr   Wears glasses/contacts 02/20/2011   Past Surgical History:  Procedure Laterality Date   ABDOMINAL HYSTERECTOMY     ANTERIOR AND POSTERIOR REPAIR N/A 02/21/2017   Procedure: ANTERIOR (CYSTOCELE) AND POSTERIOR REPAIR (RECTOCELE);  Surgeon: MCheri Fowler MD;  [facility];  Service: Gynecology;  Laterality: N/A;   BILATERAL SALPINGECTOMY N/A 02/21/2017   Procedure: BILATERAL SALPINGECTOMY;  Surgeon: MCheri Fowler MD;  [facility];  Service: Gynecology;  Laterality: N/A;   BLADDER SUSPENSION N/A 02/21/2017   Procedure: Solyx Sling;  Surgeon: MCheri Fowler MD;  [facility];  Service: Gynecology;  Laterality: N/A;   broken ankle Right    Pins/rod inserted-retained   COLONOSCOPY     DILATION AND CURETTAGE OF UTERUS     MAB   FRACTURE SURGERY     ORIF ANKLE FRACTURE Right 12/19/2018   Procedure: Removal of hardware and open reduction internal fixation of right ankle bimalleolar fracture;  Surgeon: HWylene Simmer MD;  [facility]  Service: Orthopedics;  Laterality: Right;  [facility]   THYROIDECTOMY N/A 10/29/2012   Procedure: THYROIDECTOMY;  Surgeon: TEarnstine Regal MD;  [facility];  Service: General;  Laterality: N/A;  Total Thyroidectomy   TONSILLECTOMY     VAGINAL HYSTERECTOMY Bilateral 02/21/2017   Procedure: HYSTERECTOMY VAGINAL;  Surgeon: MCheri Fowler MD;  [facility];  Service: Gynecology;  Laterality: Bilateral;   WISDOM TOOTH EXTRACTION     Social History   Socioeconomic History   Marital status: Legally Separated    Spouse name: Not on file   Number of children: 5   Years of education: Not on file   Highest education level: Not on file  Occupational  History   Not on file  Tobacco Use   Smoking status: Never   Smokeless tobacco: Never  Vaping Use   Vaping Use: Never used  Substance and Sexual Activity   Alcohol use: Not Currently    Comment: rare -social   Drug use: No   Sexual activity: Not Currently    Birth control/protection: Abstinence, Condom, Post-menopausal  Other Topics Concern   Not on file  Social History Narrative   Not on file   Social Determinants of Health   Financial Resource Strain: Not on file  Food Insecurity: Not on file  Transportation Needs: Not on file  Physical Activity: Not on file  Stress: Not on file  Social Connections: Not on file   Family History  Problem Relation Age of Onset   Arthritis Mother    Cancer Mother    Heart disease Mother    Hypertension Mother    Kidney disease Mother    Obesity Mother    Heart disease Father    Alcohol abuse Father    Vision loss Father    Colon polyps Maternal Grandmother    Allergies  Allergen Reactions   Augmentin [Amoxicillin-Pot Clavulanate] Diarrhea   Codeine     Patient stated "possible" due to mother and grandparents being allergic as well. Per medical history form dated 01/18/11. Prefers not to have.   Current Outpatient Medications  Medication Sig Dispense Refill   albuterol (PROVENTIL HFA;VENTOLIN HFA) 108 (90 Base) MCG/ACT inhaler Inhale into the lungs every 6 (six) hours as needed for wheezing or shortness of breath.     ALPRAZolam (XANAX) 0.25 MG tablet Take 1 tablet (0.25 mg total) by mouth daily as needed for anxiety. 30 tablet 2   cetirizine (ZYRTEC) 10 MG tablet Take 10 mg by mouth daily.     diltiazem (DILACOR XR) 120 MG 24 hr capsule Take 1 capsule (120 mg total) by mouth daily. 90 capsule 1   escitalopram (LEXAPRO) 20 MG tablet Take 10 mg by mouth daily.     fluticasone (FLONASE) 50 MCG/ACT nasal spray Place into both nostrils daily.     levothyroxine (SYNTHROID) 75 MCG tablet Take 2 tablets (150 mcg total) by mouth daily  before breakfast. 90 tablet 1   meloxicam (MOBIC) 7.5 MG tablet Take 1 tablet (7.5 mg total) by mouth in the morning and at bedtime. 60 tablet 2   montelukast (SINGULAIR) 10 MG tablet Take 10 mg by mouth at bedtime.     omeprazole (PRILOSEC) 20 MG capsule omeprazole 20 mg capsule,delayed release     tiZANidine (ZANAFLEX) 4 MG tablet tizanidine 4 mg tablet     zolpidem (AMBIEN) 5 MG tablet Take 1 tablet (5 mg total) by mouth at bedtime as needed for  sleep. 30 tablet 5   No current facility-administered medications for this visit.   No results found.  Review of Systems:   A ROS was performed including pertinent positives and negatives as documented in the HPI.  Physical Exam :   Constitutional: NAD and appears stated age Neurological: Alert and oriented Psych: Appropriate affect and cooperative Last menstrual period 10/12/2012.   Comprehensive Musculoskeletal Exam:    Tenderness palpation about the left SI joint.  Negative straight leg raise.  No radiation or weakness in the left lower leg.  Reflexes symmetric and equal.  Remainder of neurosensory exam is intact  Imaging:    MRI lumbar spine: There is capacious lumbar spinal canal without evidence of any disc pathology  I personally reviewed and interpreted the radiographs.   Assessment:   56 y.o. female with left-sided lower back pain in my opinion more consistent with SI joint type pain.  I reviewed her MRI today with her.  Overall I do not see any evidence of lumbar disc pathology that would be a cause of her back pain.  That effect I do believe that the majority of her symptoms are coming from her SI joint.  At this time I would like to refer her to Dr. Ernestina Patches for a left SI guided injection.  This referral has been placed and she is scheduled for next week.  We will see how she does with this.  She will contact me as needed if she does not get complete relief with this SI injection. Plan :    -Plan for referral to Dr. Ernestina Patches for  left SI injection      I personally saw and evaluated the patient, and participated in the management and treatment plan.  Alyssa Mulders, MD Attending Physician, Orthopedic Surgery  This document was dictated using Dragon voice recognition software. A reasonable attempt at proof reading has been made to minimize errors.

## 2022-02-14 ENCOUNTER — Encounter: Payer: Self-pay | Admitting: Physical Medicine and Rehabilitation

## 2022-02-15 ENCOUNTER — Ambulatory Visit (INDEPENDENT_AMBULATORY_CARE_PROVIDER_SITE_OTHER): Payer: Medicare Other | Admitting: Physical Medicine and Rehabilitation

## 2022-02-15 ENCOUNTER — Ambulatory Visit: Payer: Self-pay

## 2022-02-15 DIAGNOSIS — M461 Sacroiliitis, not elsewhere classified: Secondary | ICD-10-CM | POA: Diagnosis not present

## 2022-02-15 MED ORDER — BUPIVACAINE HCL 0.5 % IJ SOLN
2.0000 mL | INTRAMUSCULAR | Status: AC | PRN
  Administered 2022-02-15: 2 mL via INTRA_ARTICULAR

## 2022-02-15 MED ORDER — METHYLPREDNISOLONE ACETATE 80 MG/ML IJ SUSP
80.0000 mg | INTRAMUSCULAR | Status: AC | PRN
  Administered 2022-02-15: 80 mg via INTRA_ARTICULAR

## 2022-02-15 NOTE — Progress Notes (Signed)
[name] - 56 y.o. female MRN [id number]  Date of birth: [birth year]  Office Visit Note: Visit Date: 02/15/2022 PCP: Jeanie Sewer, NP Referred by: Vanetta Mulders, MD  Subjective: Chief Complaint  Patient presents with   Lower Back - Pain   Left Hip - Pain   Left Thigh - Pain   HPI:  Alyssa Fisher is a 56 y.o. female who comes in today at the request of Dr. Vanetta Mulders for planned Left anesthetic sacroiliac joint arthrogram with fluoroscopic guidance.  The patient has failed conservative care including home exercise, medications, time and activity modification.  This injection will be diagnostic and hopefully therapeutic.  Please see requesting physician notes for further details and justification.   ROS Otherwise per HPI.  Assessment & Plan: Visit Diagnoses:    ICD-10-CM   1. Sacroiliitis (Felt)  M46.1 Left Sacroiliac Joint Inj      Plan: No additional findings.   Meds & Orders: No orders of the defined types were placed in this encounter.   Orders Placed This Encounter  Procedures   Left Sacroiliac Joint Inj    Follow-up: Return for visit to requesting provider as needed.   Procedures: Left Sacroiliac Joint Inj on 02/15/2022 1:05 PM Indications: pain and diagnostic evaluation Details: 22 G 3.5 in needle, fluoroscopy-guided posterior approach Medications: 2 mL bupivacaine 0.5 %; 80 mg methylPREDNISolone acetate 80 MG/ML Outcome: tolerated well, no immediate complications  Sacroiliac Joint Intra-Articular Injection - Posterior Approach with Fluoroscopic Guidance   Position: PRONE  Additional Comments: Vital signs were monitored before and after the procedure. Patient was prepped and draped in the usual sterile fashion. The correct patient, procedure, and site was verified.   Injection Procedure Details:   Location/Site:  Sacroiliac joint  Needle size: 3.5 in Spinal Needle  Needle type: Spinal  Needle Placement:  Intra-articular  Findings:  -Comments: There was excellent flow of contrast producing a partial arthrogram of the sacroiliac joint.   Procedure Details: Starting with a 90 degree vertical and midline orientation the fluoroscope was tilted cranially 20 to 25 degrees and the target area of the inferior most part of the SI joint on the side mentioned above was visualized.  The soft tissues overlying this target were infiltrated with 4 ml. of 1% Lidocaine without Epinephrine. A #22 gauge spinal needle was inserted perpendicular to the fluoroscope table and advanced into the posterior inferior joint space using fluoroscopic guidance.  Position in the joint space was confirmed by obtaining a partial arthrogram using a 2 ml. volume of Isovue-250 contrast agent. After negative aspirate for gross pus or blood, the injectate was delivered to the joint. Radiographs were obtained for documentation purposes.   Additional Comments:   Dressing: Bandaid    Post-procedure details: Patient was observed during the procedure. Post-procedure instructions were reviewed.  Patient left the clinic in stable condition.    There was excellent flow of contrast producing a partial arthrogram of the sacroiliac joint.  Procedure, treatment alternatives, risks and benefits explained, specific risks discussed. Consent was given by the patient. Immediately prior to procedure a time out was called to verify the correct patient, procedure, equipment, support staff and site/side marked as required. Patient was prepped and draped in the usual sterile fashion.          Clinical History: MRI LUMBAR SPINE WITHOUT CONTRAST   TECHNIQUE: Multiplanar, multisequence MR imaging of the lumbar spine was performed. No intravenous contrast was administered.   COMPARISON:  None Available.  FINDINGS: Segmentation:  Standard.   Alignment:  Trace retrolisthesis of L2 over L3.   Vertebrae:  No fracture, evidence of discitis,  or bone lesion.   Conus medullaris and cauda equina: Conus extends to the L1-2 level. Conus and cauda equina appear normal.   Paraspinal and other soft tissues: Negative.   Disc levels:   T12-L1: Small left central disc protrusion causing minimal indentation on the thecal sac. No spinal canal or neural foraminal stenosis.   L1-2: Small central disc protrusion causing small indentation of the thecal sac without significant spinal canal or neural foraminal stenosis.   L2-3: Minimal disc bulge. No spinal canal or neural foraminal stenosis.   L3-4: No spinal canal or neural foraminal stenosis.   L4-5: Shallow disc bulge and facet hypertrophy without significant spinal canal or neural foraminal stenosis.   L5-S1: Small central annular tear and facet hypertrophy. No significant spinal canal or neural foraminal stenosis.   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 No high-grade spinal canal or neural foraminal stenosis at any level.     Electronically Signed   By: Pedro Earls M.D.   On: 01/31/2022 13:16     Objective:  VS:  HT:    WT:   BMI:     BP:   HR: bpm  TEMP: ( )  RESP:  Physical Exam Musculoskeletal:     Comments: Positive Fortin finger sign, Patrick's testing and lateral compression test.        Imaging: No results found.

## 2022-02-15 NOTE — Progress Notes (Signed)
Pt state lower back pain that travel to her left buttock, hip and down the posterior left thigh. Pt state any movement makes the pain worse. Pt state she takes pain meds to help ease her pain.  Numeric Pain Rating Scale and Functional Assessment Average Pain 4   In the last MONTH (on 0-10 scale) has pain interfered with the following?  1. General activity like being  able to carry out your everyday physical activities such as walking, climbing stairs, carrying groceries, or moving a chair?  Rating(10)   +Driver, -BT, -Dye Allergies.

## 2022-02-15 NOTE — Addendum Note (Signed)
Addended by: Raymondo Band on: 02/15/2022 01:28 PM   Modules accepted: Orders

## 2022-03-10 ENCOUNTER — Other Ambulatory Visit: Payer: Self-pay

## 2022-03-10 ENCOUNTER — Encounter: Payer: Self-pay | Admitting: Family

## 2022-03-10 MED ORDER — MONTELUKAST SODIUM 10 MG PO TABS: 10.0000 mg | ORAL_TABLET | Freq: Every day | ORAL | 0 refills | Status: DC

## 2022-03-16 ENCOUNTER — Other Ambulatory Visit: Payer: Self-pay

## 2022-03-16 ENCOUNTER — Encounter (INDEPENDENT_AMBULATORY_CARE_PROVIDER_SITE_OTHER): Payer: Medicare Other | Admitting: Family

## 2022-03-16 DIAGNOSIS — N760 Acute vaginitis: Secondary | ICD-10-CM

## 2022-03-16 DIAGNOSIS — B9689 Other specified bacterial agents as the cause of diseases classified elsewhere: Secondary | ICD-10-CM

## 2022-03-16 MED ORDER — METRONIDAZOLE 500 MG PO TABS: 500.0000 mg | ORAL_TABLET | Freq: Two times a day (BID) | ORAL | 0 refills | Status: DC

## 2022-03-16 MED ORDER — METRONIDAZOLE 500 MG PO TABS: 500.0000 mg | ORAL_TABLET | Freq: Two times a day (BID) | ORAL | 0 refills | Status: AC

## 2022-03-16 NOTE — Telephone Encounter (Signed)
Pt states incorrect pharmacy receive Rx.  Please resend asap   [id number] [place] [address] Phone:  [phone] Fax:  [phone] DEA #:  [id number]

## 2022-03-16 NOTE — Telephone Encounter (Signed)
Please see the MyChart message reply(ies) for my assessment and plan.  The patient gave consent for this Medical Advice Message and is aware that it may result in a bill to their insurance company as well as the possibility that this may result in a co-payment or deductible. They are an established patient, but are not seeking medical advice exclusively about a problem treated during an in person or video visit in the last 7 days. I did not recommend an in person or video visit within 7 days of my reply.  I spent a total of 10 minutes cumulative time within 7 days through MyChart messaging Veronda Gabor, NP  

## 2022-03-30 ENCOUNTER — Other Ambulatory Visit: Payer: Self-pay | Admitting: Family

## 2022-04-07 ENCOUNTER — Encounter: Payer: Self-pay | Admitting: Family

## 2022-04-07 ENCOUNTER — Telehealth (INDEPENDENT_AMBULATORY_CARE_PROVIDER_SITE_OTHER): Payer: Medicare Other | Admitting: Family

## 2022-04-07 VITALS — Ht 65.0 in | Wt 176.0 lb

## 2022-04-07 DIAGNOSIS — M5416 Radiculopathy, lumbar region: Secondary | ICD-10-CM | POA: Diagnosis not present

## 2022-04-07 DIAGNOSIS — F419 Anxiety disorder, unspecified: Secondary | ICD-10-CM | POA: Diagnosis not present

## 2022-04-07 MED ORDER — BUSPIRONE HCL 5 MG PO TABS: 5.0000 mg | ORAL_TABLET | Freq: Three times a day (TID) | ORAL | 2 refills | Status: DC

## 2022-04-07 MED ORDER — MELOXICAM 7.5 MG PO TABS: 7.5000 mg | ORAL_TABLET | Freq: Two times a day (BID) | ORAL | 2 refills | Status: DC

## 2022-04-07 NOTE — Assessment & Plan Note (Signed)
   chronic  followed by [facility]  getting spinal injections, helping some  refilling Mobic  f/u 6 mos or prn

## 2022-04-07 NOTE — Progress Notes (Signed)
MyChart Video Visit    Virtual Visit via Video Note   This format is felt to be most appropriate for this patient at this time. Physical exam was limited by quality of the video and audio technology used for the visit. CMA was able to get the patient set up on a video visit.  Patient location: Home. Patient and provider in visit Provider location: Office  I discussed the limitations of evaluation and management by telemedicine and the availability of in person appointments. The patient expressed understanding and agreed to proceed.  Visit Date: 04/07/2022  Today's healthcare provider: Jeanie Sewer, NP     Subjective:   Patient ID: Alyssa Fisher, female    DOB: 1966-04-11, 56 y.o.   MRN: [id number]  Chief Complaint  Patient presents with   Anxiety    Pt states her Anxiety has been extremely high. Pt states she has been taking 10 mg of her Lexapro but she has had up her dose back to 20 mg. Pt states she would like to increase the dose of her Xanax.   HPI Anxiety/Depression: Patient complains of anxiety disorder.   She has the following symptoms: none.  Onset of symptoms was approximately years ago, She denies current suicidal and homicidal ideation.  Possible organic causes contributing are: none.  Risk factors: none. Previous treatment includes Lexapro and Xanax and individual therapy.  She complains of the following side effects from the treatment: none. Assessment & Plan:   Problem List Items Addressed This Visit       Nervous and Auditory   Chronic left-sided lumbar radiculopathy    chronic followed by [facility] getting spinal injections, helping some refilling Mobic f/u 6 mos or prn      Relevant Medications   meloxicam (MOBIC) 7.5 MG tablet        Other   Anxiety - Primary    chronic Lexapro '10mg'$ , she increased on her own to '20mg'$  a week ago due to increased stress - staying in [facility] with dtr & grandkids, divorcing her husb, taking Xanax qd advised to give  the Lexapro time to work, another week or 2 not increasing Xanax dose sending Buspar to try, '5mg'$  tid, can increase to '10mg'$  prn f/u 1-69mo      Relevant Medications   busPIRone (BUSPAR) 5 MG tablet    Past Medical History:  Diagnosis Date   Anxiety    Broken ankle    Chronic left-sided lumbar radiculopathy 01/05/2022   Contraceptive management 11/21/2012   Depression    Dizziness - light-headed/fainting 02/20/2011   Dysphagia 10/23/2012   due to thyroid issues   Dysphagia 03/01/2011   Fatigue/Loss of Sleep 02/20/2011   FHx: allergies 02/20/2011   Ask patient to clarify. Only had "allergies" noted. Need more detail per medical history form dated 01/18/11.   Generalized headaches 02/20/2011   GERD (gastroesophageal reflux disease)    History of tonsillectomy    Hoarseness/sore throat 02/20/2011   Hypertension    Miscarriage    One - date not on medical form.   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- has been followed x  64yr   Wears glasses/contacts 02/20/2011    Past Surgical History:  Procedure Laterality Date   ABDOMINAL HYSTERECTOMY     ANTERIOR AND POSTERIOR REPAIR N/A 02/21/2017   Procedure: ANTERIOR (CYSTOCELE) AND POSTERIOR REPAIR (RECTOCELE);  Surgeon: MCheri Fowler MD;  [facility];  Service: Gynecology;  Laterality: N/A;   BILATERAL SALPINGECTOMY N/A 02/21/2017   Procedure: BILATERAL SALPINGECTOMY;  Surgeon: MCheri Fowler MD;  [facility];  Service: Gynecology;  Laterality: N/A;   BLADDER SUSPENSION N/A 02/21/2017   Procedure: Solyx Sling;  Surgeon: MCheri Fowler MD;  [facility];  Service: Gynecology;  Laterality: N/A;    broken ankle Right    Pins/rod inserted-retained   COLONOSCOPY     DILATION AND CURETTAGE OF UTERUS     MAB   FRACTURE SURGERY     ORIF ANKLE FRACTURE Right 12/19/2018   Procedure: Removal of hardware and open reduction internal fixation of right ankle bimalleolar fracture;  Surgeon: HWylene Simmer MD;  [facility]  Service: Orthopedics;  Laterality: Right;  [facility]   THYROIDECTOMY N/A 10/29/2012   Procedure: THYROIDECTOMY;  Surgeon: ToEarnstine RegalMD;  [facility];  Service: General;  Laterality: N/A;  Total Thyroidectomy   TONSILLECTOMY     VAGINAL HYSTERECTOMY Bilateral 02/21/2017   Procedure: HYSTERECTOMY VAGINAL;  Surgeon: MeCheri FowlerMD;  [facility];  Service: Gynecology;  Laterality: Bilateral;   WISDOM TOOTH EXTRACTION      Outpatient Medications Prior to Visit  Medication Sig Dispense Refill   albuterol (PROVENTIL HFA;VENTOLIN HFA) 108 (90 Base) MCG/ACT inhaler Inhale into the lungs every 6 (six) hours as needed for wheezing or shortness of breath.     ALPRAZolam (XANAX) 0.25 MG tablet Take 1 tablet (0.25 mg total) by mouth daily as needed for anxiety. 30 tablet 2   cetirizine (ZYRTEC) 10 MG tablet Take 10 mg by mouth daily.     diltiazem (DILACOR XR) 120 MG 24 hr capsule Take 1 capsule (120 mg total) by mouth daily. 90 capsule 1   EPINEPHrine (EPIPEN 2-PAK) 0.3 mg/0.3 mL IJ SOAJ injection 0.3 mg by injection route.     escitalopram (LEXAPRO) 20 MG tablet Take 10 mg by mouth daily.     levothyroxine (SYNTHROID) 75 MCG tablet TAKE 2 TABLETS (150 MCG TOTAL) BY MOUTH DAILY BEFORE BREAKFAST. 90 tablet 1   montelukast (SINGULAIR) 10 MG tablet Take 1 tablet (10 mg total) by mouth at bedtime. 60 tablet 0   omeprazole (PRILOSEC) 20 MG capsule omeprazole 20 mg capsule,delayed release     zolpidem (AMBIEN) 5 MG tablet Take 1 tablet (5 mg total) by mouth at bedtime as needed for sleep. 30 tablet 5   fluticasone  (FLONASE) 50 MCG/ACT nasal spray Place into both nostrils daily.     meloxicam (MOBIC) 7.5 MG tablet Take 1 tablet (7.5 mg total) by mouth in the morning and at bedtime. 60 tablet 2   tiZANidine (ZANAFLEX) 4 MG tablet tizanidine 4 mg tablet     No facility-administered medications prior to visit.    Allergies  Allergen Reactions   Augmentin [Amoxicillin-Pot Clavulanate] Diarrhea   Codeine     Patient stated "possible" due to mother and grandparents being allergic as well. Per medical history form dated 01/18/11. Prefers not to have.       Objective:   Physical Exam Vitals and nursing note reviewed.  Constitutional:      General: She is  not in acute distress.    Appearance: Normal appearance.  HENT:     Head: Normocephalic.  Pulmonary:     Effort: No respiratory distress.  Musculoskeletal:     Cervical back: Normal range of motion.  Skin:    General: Skin is dry.     Coloration: Skin is not pale.  Neurological:     Mental Status: She is alert and oriented to person, place, and time.  Psychiatric:        Mood and Affect: Mood normal.   Ht '5\' 5"'$  (1.651 m)   Wt 176 lb (79.8 kg)   LMP 10/12/2012   BMI 29.29 kg/m   Wt Readings from Last 3 Encounters:  04/07/22 176 lb (79.8 kg)  02/01/22 183 lb 4 oz (83.1 kg)  01/20/22 183 lb 8 oz (83.2 kg)      I discussed the assessment and treatment plan with the patient. The patient was provided an opportunity to ask questions and all were answered. The patient agreed with the plan and demonstrated an understanding of the instructions.   The patient was advised to call back or seek an in-person evaluation if the symptoms worsen or if the condition fails to improve as anticipated.  Jeanie Sewer, NP [place] [phone] (phone) [phone] (fax)  [place]

## 2022-04-07 NOTE — Assessment & Plan Note (Signed)
   chronic  Lexapro '10mg'$ , she increased on her own to '20mg'$  a week ago due to increased stress - staying in [facility] with dtr & grandkids, divorcing her husb, taking Xanax qd  advised to give the Lexapro time to work, another week or 2  not increasing Xanax dose  sending Buspar to try, '5mg'$  tid, can increase to '10mg'$  prn  f/u 1-73mo

## 2022-05-01 ENCOUNTER — Other Ambulatory Visit: Payer: Self-pay | Admitting: Family

## 2022-05-01 NOTE — Progress Notes (Unsigned)
Patient ID: Alyssa Fisher, female    DOB: 03-Mar-1966, 56 y.o.   MRN: [id number]  No chief complaint on file.   HPI:   Assessment & Plan:   Problem List Items Addressed This Visit   None   Subjective:    Outpatient Medications Prior to Visit  Medication Sig Dispense Refill   albuterol (PROVENTIL HFA;VENTOLIN HFA) 108 (90 Base) MCG/ACT inhaler Inhale into the lungs every 6 (six) hours as needed for wheezing or shortness of breath.     ALPRAZolam (XANAX) 0.25 MG tablet Take 1 tablet (0.25 mg total) by mouth daily as needed for anxiety. 30 tablet 2   busPIRone (BUSPAR) 5 MG tablet Take 1-2 tablets (5-10 mg total) by mouth 3 (three) times daily. 60 tablet 2   cetirizine (ZYRTEC) 10 MG tablet Take 10 mg by mouth daily.     diltiazem (DILACOR XR) 120 MG 24 hr capsule Take 1 capsule (120 mg total) by mouth daily. 90 capsule 1   EPINEPHrine (EPIPEN 2-PAK) 0.3 mg/0.3 mL IJ SOAJ injection 0.3 mg by injection route.     escitalopram (LEXAPRO) 20 MG tablet Take 10 mg by mouth daily.     levothyroxine (SYNTHROID) 75 MCG tablet TAKE 2 TABLETS (150 MCG TOTAL) BY MOUTH DAILY BEFORE BREAKFAST. 90 tablet 1   meloxicam (MOBIC) 7.5 MG tablet Take 1 tablet (7.5 mg total) by mouth in the morning and at bedtime. 60 tablet 2   montelukast (SINGULAIR) 10 MG tablet Take 1 tablet (10 mg total) by mouth at bedtime. 60 tablet 0   omeprazole (PRILOSEC) 20 MG capsule omeprazole 20 mg capsule,delayed release     zolpidem (AMBIEN) 5 MG tablet Take 1 tablet (5 mg total) by mouth at bedtime as needed for sleep. 30 tablet 5   No facility-administered medications prior to visit.   Past Medical History:  Diagnosis Date   Anxiety    Broken ankle    Chronic left-sided lumbar radiculopathy 01/05/2022   Contraceptive management 11/21/2012   Depression    Dizziness - light-headed/fainting 02/20/2011   Dysphagia 10/23/2012   due to thyroid issues   Dysphagia 03/01/2011   Fatigue/Loss of Sleep 02/20/2011   FHx: allergies  02/20/2011   Ask patient to clarify. Only had "allergies" noted. Need more detail per medical history form dated 01/18/11.   Generalized headaches 02/20/2011   GERD (gastroesophageal reflux disease)    History of tonsillectomy    Hoarseness/sore throat 02/20/2011   Hypertension    Miscarriage    One - date not on medical form.   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- has been followed x 23yr   Wears glasses/contacts 02/20/2011   Past Surgical History:  Procedure Laterality Date   ABDOMINAL HYSTERECTOMY     ANTERIOR AND POSTERIOR REPAIR N/A 02/21/2017   Procedure: ANTERIOR (CYSTOCELE) AND POSTERIOR REPAIR (RECTOCELE);  Surgeon: MCheri Fowler MD;  [facility];  Service: Gynecology;  Laterality: N/A;   BILATERAL SALPINGECTOMY N/A 02/21/2017   Procedure: BILATERAL SALPINGECTOMY;  Surgeon: MCheri Fowler MD;  [facility];  Service: Gynecology;  Laterality: N/A;   BLADDER SUSPENSION N/A 02/21/2017  Procedure: Solyx Sling;  Surgeon: Cheri Fowler, MD;  Location: [facility];  Service: Gynecology;  Laterality: N/A;   broken ankle Right    Pins/rod inserted-retained   COLONOSCOPY     DILATION AND CURETTAGE OF UTERUS     MAB   FRACTURE SURGERY     ORIF ANKLE FRACTURE Right 12/19/2018   Procedure: Removal of hardware and open reduction internal fixation of right ankle bimalleolar fracture;  Surgeon: Wylene Simmer, MD;  Location: [facility];  Service: Orthopedics;  Laterality: Right;  [facility]   THYROIDECTOMY N/A 10/29/2012   Procedure: THYROIDECTOMY;  Surgeon: TEarnstine Regal MD;  [facility];  Service:  General;  Laterality: N/A;  Total Thyroidectomy   TONSILLECTOMY     VAGINAL HYSTERECTOMY Bilateral 02/21/2017   Procedure: HYSTERECTOMY VAGINAL;  Surgeon: MCheri Fowler MD;  [facility];  Service: Gynecology;  Laterality: Bilateral;   WISDOM TOOTH EXTRACTION     Allergies  Allergen Reactions   Augmentin [Amoxicillin-Pot Clavulanate] Diarrhea   Codeine     Patient stated "possible" due to mother and grandparents being allergic as well. Per medical history form dated 01/18/11. Prefers not to have.      Objective:    Physical Exam LMP 10/12/2012  Wt Readings from Last 3 Encounters:  04/07/22 176 lb (79.8 kg)  02/01/22 183 lb 4 oz (83.1 kg)  01/20/22 183 lb 8 oz (83.2 kg)       HJeanie Sewer NP

## 2022-05-02 ENCOUNTER — Encounter: Payer: Self-pay | Admitting: Family

## 2022-05-02 ENCOUNTER — Ambulatory Visit (INDEPENDENT_AMBULATORY_CARE_PROVIDER_SITE_OTHER): Payer: Medicare Other | Admitting: Family

## 2022-05-02 VITALS — BP 110/80 | HR 62 | Temp 98.0°F | Ht 65.0 in | Wt 181.2 lb

## 2022-05-02 DIAGNOSIS — R635 Abnormal weight gain: Secondary | ICD-10-CM

## 2022-05-02 DIAGNOSIS — I10 Essential (primary) hypertension: Secondary | ICD-10-CM

## 2022-05-02 DIAGNOSIS — E890 Postprocedural hypothyroidism: Secondary | ICD-10-CM

## 2022-05-02 DIAGNOSIS — Z23 Encounter for immunization: Secondary | ICD-10-CM | POA: Diagnosis not present

## 2022-05-02 DIAGNOSIS — F419 Anxiety disorder, unspecified: Secondary | ICD-10-CM | POA: Diagnosis not present

## 2022-05-02 LAB — LIPID PANEL
Cholesterol: 197 mg/dL (ref 0–200)
HDL: 49.8 mg/dL (ref >39.00)
LDL Cholesterol: 123 mg/dL — ABNORMAL HIGH (ref 0–99)
NonHDL: 147.04
Total CHOL/HDL Ratio: 4
Triglycerides: 122 mg/dL (ref 0.0–149.0)
VLDL: 24.4 mg/dL (ref 0.0–40.0)

## 2022-05-02 LAB — T4, FREE: Free T4: 1.19 ng/dL (ref 0.60–1.60)

## 2022-05-02 LAB — TSH: TSH: 0.36 u[IU]/mL (ref 0.35–5.50)

## 2022-05-02 MED ORDER — HYDROXYZINE HCL 10 MG PO TABS: 10.0000 mg | ORAL_TABLET | Freq: Three times a day (TID) | ORAL | 2 refills | Status: DC | PRN

## 2022-05-02 NOTE — Patient Instructions (Addendum)
It was very nice to see you today!   I will review your lab results via MyChart in a few days.  I have sent over Hydroxyzine to replace the Buspirone to help with your anxiety, I have sent the lowest dose, you can take 1-2 pills during the day and/or in the evenings to calm your mind and/or prepare for sleep.  Check with your insurance and see if any medications are covered: Wegovy, Saxenda, or Ozempic are the injectables. Qsymia or Plenity are pills and you can look online at their suscription plans, these are not normally covered by insurance.  Follow up in 3-4 months or sooner if needed.     PLEASE NOTE:  If you had any lab tests please let us know if you have not heard back within a few days. You may see your results on MyChart before we have a chance to review them but we will give you a call once they are reviewed by [place]. If we ordered any referrals today, please let us know if you have not heard from their office within the next week.

## 2022-05-02 NOTE — Assessment & Plan Note (Signed)
   Chronic  discussed medication options, pt will check with her insurance.   discussed online med options, she can look into & let me know  Wt. Loss strategies reviewed including portion control, less carbs including sweets, eating most of calories earlier in day, drinking 64oz water qd, and establishing daily exercise routine.  f/u prn

## 2022-05-02 NOTE — Assessment & Plan Note (Addendum)
.   chronic . did not take the Buspar, remembered it caused "brain zaps" . discussed Hydroxyzine, pt willing to try, advised can take during day, but may help in evenings to calm/sedate/turn off running thoughts in order to get ready for sleep in the evening, advised on use & SE . f/u in 3 mos or prn

## 2022-05-02 NOTE — Assessment & Plan Note (Signed)
   Chronic  stable on Diltiazem  no lipids panel in over a year, checking today  f/u in 6 mos

## 2022-05-02 NOTE — Assessment & Plan Note (Signed)
   Chronic  last TSH very low, but T4 in normal range  pt taking Levo 133mg qd  rechecking level today  f/u 6 mos

## 2022-05-03 ENCOUNTER — Encounter: Payer: Self-pay | Admitting: Family

## 2022-05-03 ENCOUNTER — Ambulatory Visit (INDEPENDENT_AMBULATORY_CARE_PROVIDER_SITE_OTHER): Payer: Medicare Other | Admitting: Orthopaedic Surgery

## 2022-05-03 ENCOUNTER — Encounter (HOSPITAL_BASED_OUTPATIENT_CLINIC_OR_DEPARTMENT_OTHER): Payer: Self-pay | Admitting: Orthopaedic Surgery

## 2022-05-03 DIAGNOSIS — F419 Anxiety disorder, unspecified: Secondary | ICD-10-CM

## 2022-05-03 DIAGNOSIS — M79605 Pain in left leg: Secondary | ICD-10-CM | POA: Diagnosis not present

## 2022-05-03 DIAGNOSIS — M67952 Unspecified disorder of synovium and tendon, left thigh: Secondary | ICD-10-CM | POA: Diagnosis not present

## 2022-05-03 DIAGNOSIS — M25552 Pain in left hip: Secondary | ICD-10-CM | POA: Diagnosis not present

## 2022-05-03 DIAGNOSIS — M67959 Unspecified disorder of synovium and tendon, unspecified thigh: Secondary | ICD-10-CM

## 2022-05-03 MED ORDER — TRIAMCINOLONE ACETONIDE 40 MG/ML IJ SUSP
80.0000 mg | INTRAMUSCULAR | Status: AC | PRN
  Administered 2022-05-03: 80 mg via INTRA_ARTICULAR

## 2022-05-03 MED ORDER — LIDOCAINE HCL 1 % IJ SOLN
4.0000 mL | INTRAMUSCULAR | Status: AC | PRN
  Administered 2022-05-03: 4 mL

## 2022-05-03 NOTE — Progress Notes (Signed)
Chief Complaint: Left hip lower back pain     History of Present Illness:   05/03/2022: Presents today for follow-up of her right hip and lower back pain.  She states that she got approximately 3 weeks of relief from SI guided injection.  He has somewhat frustrated with the hip pain that she is continuing to have difficult time being active and laying on the side.  Here today for further assessment.  Alyssa Fisher is a 56 y.o. female presents with over 10 years of left-sided back pain with some radiation down the thigh.  She has previously taken tramadol and Mobic for this which does help her.  She has undergone 2 rounds of physical therapy for several months with minimal relief.  She has not had any injections.  She did have a fall down the steps in 2011 at which time the pain began on the left side.  She describes it as a pinching feeling.  She is having a hard time laying directly on the side.  She is very active with her children and grandchildren.  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    Surgical History:   None  PMH/PSH/Family History/Social History/Meds/Allergies:    Past Medical History:  Diagnosis Date  . Anxiety   . Broken ankle   . Chronic left-sided lumbar radiculopathy 01/05/2022  . Contraceptive management 11/21/2012  . Depression   . Dizziness - light-headed/fainting 02/20/2011  . Dysphagia 10/23/2012   due to thyroid issues  . Dysphagia 03/01/2011  . Fatigue/Loss of Sleep 02/20/2011  . FHx: allergies 02/20/2011   Ask patient to clarify. Only had "allergies" noted. Need more detail per medical history form dated 01/18/11.  . Generalized headaches 02/20/2011  . GERD (gastroesophageal reflux disease)   . History of tonsillectomy   . Hoarseness/sore throat 02/20/2011  . Hypertension   . Miscarriage    One - date not on medical form.  . Mitral valve prolapse 10/23/2012   no meds  . Night sweats 02/20/2011   . Onychomycosis 12/16/2013  . Rash/bruises easily 02/20/2011  . 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 Rosacea   . Runny nose 02/20/2011  . Seasonal allergies    uses albuterol inhaler as needed for allergies  . Sinus problem 02/20/2011  . Thyroid goiter 10/23/2012   multinodular- has been followed x 55yr  . Wears glasses/contacts 02/20/2011   Past Surgical History:  Procedure Laterality Date  . ABDOMINAL HYSTERECTOMY    . ANTERIOR AND POSTERIOR REPAIR N/A 02/21/2017   Procedure: ANTERIOR (CYSTOCELE) AND POSTERIOR REPAIR (RECTOCELE);  Surgeon: MCheri Fowler MD;  [facility];  Service: Gynecology;  Laterality: N/A;  . BILATERAL SALPINGECTOMY N/A 02/21/2017   Procedure: BILATERAL SALPINGECTOMY;  Surgeon: MCheri Fowler MD;  [facility];  Service: Gynecology;  Laterality: N/A;  . BLADDER SUSPENSION N/A 02/21/2017   Procedure: Solyx Sling;  Surgeon: MCheri Fowler MD;  [facility];  Service: Gynecology;  Laterality: N/A;  . broken ankle Right    Pins/rod inserted-retained  . COLONOSCOPY    . DILATION AND CURETTAGE OF UTERUS     MAB  . FRACTURE SURGERY    . ORIF ANKLE FRACTURE Right 12/19/2018   Procedure: Removal of hardware and open reduction internal fixation of  right ankle bimalleolar fracture;  Surgeon: Wylene Simmer, MD;  Location: [facility];  Service: Orthopedics;  Laterality: Right;  [facility]  . THYROIDECTOMY N/A 10/29/2012   Procedure: THYROIDECTOMY;  Surgeon: TEarnstine Regal MD;  [facility];  Service: General;  Laterality: N/A;  Total Thyroidectomy  . TONSILLECTOMY    . VAGINAL HYSTERECTOMY Bilateral 02/21/2017   Procedure: HYSTERECTOMY VAGINAL;  Surgeon: MCheri Fowler MD;  [facility];  Service: Gynecology;  Laterality: Bilateral;  . WISDOM TOOTH EXTRACTION     Social History   Socioeconomic History  . Marital status: Legally Separated    Spouse name: Not on file   . Number of children: 5  . Years of education: Not on file  . Highest education level: Not on file  Occupational History  . Not on file  Tobacco Use  . Smoking status: Never  . Smokeless tobacco: Never  Vaping Use  . Vaping Use: Never used  Substance and Sexual Activity  . Alcohol use: Not Currently    Comment: rare -social  . Drug use: No  . Sexual activity: Not Currently    Birth control/protection: Abstinence, Condom, Post-menopausal  Other Topics Concern  . Not on file  Social History Narrative  . Not on file   Social Determinants of Health   Financial Resource Strain: Not on file  Food Insecurity: Not on file  Transportation Needs: Not on file  Physical Activity: Not on file  Stress: Not on file  Social Connections: Not on file   Family History  Problem Relation Age of Onset  . Arthritis Mother   . Cancer Mother   . Heart disease Mother   . Hypertension Mother   . Kidney disease Mother   . Obesity Mother   . Heart disease Father   . Alcohol abuse Father   . Vision loss Father   . Colon polyps Maternal Grandmother    Allergies  Allergen Reactions  . Augmentin [Amoxicillin-Pot Clavulanate] Diarrhea  . Codeine     Patient stated "possible" due to mother and grandparents being allergic as well. Per medical history form dated 01/18/11. Prefers not to have.   Current Outpatient Medications  Medication Sig Dispense Refill  . ALPRAZolam (XANAX) 0.25 MG tablet Take 1 tablet (0.25 mg total) by mouth daily as needed for anxiety. 30 tablet 2  . cetirizine (ZYRTEC) 10 MG tablet Take 10 mg by mouth daily.    .Marland Kitchendiltiazem (DILACOR XR) 120 MG 24 hr capsule Take 1 capsule (120 mg total) by mouth daily. 90 capsule 1  . escitalopram (LEXAPRO) 20 MG tablet Take 20 mg by mouth daily.    . hydrOXYzine (ATARAX) 10 MG tablet Take 1-2 tablets (10-20 mg total) by mouth 3 (three) times daily as needed for anxiety. 30 tablet 2  . levothyroxine (SYNTHROID) 75 MCG tablet TAKE 2 TABLETS  (150 MCG TOTAL) BY MOUTH DAILY BEFORE BREAKFAST. 90 tablet 1  . meloxicam (MOBIC) 7.5 MG tablet Take 1 tablet (7.5 mg total) by mouth in the morning and at bedtime. 60 tablet 2  . montelukast (SINGULAIR) 10 MG tablet TAKE 1 TABLET BY MOUTH EVERYDAY AT BEDTIME 60 tablet 0  . omeprazole (PRILOSEC) 20 MG capsule omeprazole 20 mg capsule,delayed release    . zolpidem (AMBIEN) 5 MG tablet Take 1 tablet (5 mg total) by mouth at bedtime as needed for sleep. 30 tablet 5   No current facility-administered medications for this visit.  No results found.  Review of Systems:   A ROS was performed including pertinent positives and negatives as documented in the HPI.  Physical Exam :   Constitutional: NAD and appears stated age Neurological: Alert and oriented Psych: Appropriate affect and cooperative Last menstrual period 10/12/2012.   Comprehensive Musculoskeletal Exam:    Tenderness palpation about the left SI joint.  Negative straight leg raise.  No radiation or weakness in the left lower leg.  Reflexes symmetric and equal.  Remainder of neurosensory exam is intact  At today's visit there is tenderness to palpation about the greater trochanter as well with radiation to the gluteal area as well as a positive Trendelenburg.  Imaging:    MRI lumbar spine: There is capacious lumbar spinal canal without evidence of any disc pathology  I personally reviewed and interpreted the radiographs.   Assessment:   56 y.o. female today with more gluteus medius tendinitis versus tendinopathy symptoms.  Given this I recommended ultrasound-guided injection into this area so we can further assess if this is her true underlying source of pain.  She does get good relief from this we will consider an MRI of the left hip. Plan :    -Left ultrasound-guided gluteus medius injection performed after verbal consent obtained -Return to clinic in 2 months for reassessment    Procedure Note  Patient: BRILEE PORT             Date of Birth: [birth year]           MRN: [id number]             Visit Date: 05/03/2022  Procedures: Visit Diagnoses: No diagnosis found.  Large Joint Inj: L greater trochanter on 05/03/2022 11:44 AM Indications: pain Details: 22 G 3.5 in needle, ultrasound-guided anterolateral approach  Arthrogram: No  Medications: 4 mL lidocaine 1 %; 80 mg triamcinolone acetonide 40 MG/ML Outcome: tolerated well, no immediate complications Procedure, treatment alternatives, risks and benefits explained, specific risks discussed. Consent was given by the patient. Immediately prior to procedure a time out was called to verify the correct patient, procedure, equipment, support staff and site/side marked as required. Patient was prepped and draped in the usual sterile fashion.           I personally saw and evaluated the patient, and participated in the management and treatment plan.  Vanetta Mulders, MD Attending Physician, Orthopedic Surgery  This document was dictated using Dragon voice recognition software. A reasonable attempt at proof reading has been made to minimize errors.

## 2022-05-04 MED ORDER — HYDROXYZINE HCL 10 MG PO TABS: 10.0000 mg | ORAL_TABLET | Freq: Three times a day (TID) | ORAL | 2 refills | Status: DC | PRN

## 2022-05-04 NOTE — Progress Notes (Signed)
Thyroid levels all in good range. We can recheck this again in 6 months. I reviewed your cholesterol panel in a separate message.

## 2022-05-05 ENCOUNTER — Other Ambulatory Visit (HOSPITAL_BASED_OUTPATIENT_CLINIC_OR_DEPARTMENT_OTHER): Payer: Self-pay | Admitting: Orthopaedic Surgery

## 2022-05-05 DIAGNOSIS — M67959 Unspecified disorder of synovium and tendon, unspecified thigh: Secondary | ICD-10-CM

## 2022-05-05 NOTE — Telephone Encounter (Signed)
Referral sent 

## 2022-05-19 ENCOUNTER — Encounter: Payer: Self-pay | Admitting: Family

## 2022-05-19 ENCOUNTER — Ambulatory Visit (INDEPENDENT_AMBULATORY_CARE_PROVIDER_SITE_OTHER): Payer: Medicare Other

## 2022-05-19 DIAGNOSIS — Z0000 Encounter for general adult medical examination without abnormal findings: Secondary | ICD-10-CM

## 2022-05-19 DIAGNOSIS — E669 Obesity, unspecified: Secondary | ICD-10-CM

## 2022-05-19 DIAGNOSIS — Z1231 Encounter for screening mammogram for malignant neoplasm of breast: Secondary | ICD-10-CM | POA: Diagnosis not present

## 2022-05-19 DIAGNOSIS — E7800 Pure hypercholesterolemia, unspecified: Secondary | ICD-10-CM

## 2022-05-19 DIAGNOSIS — I10 Essential (primary) hypertension: Secondary | ICD-10-CM

## 2022-05-19 DIAGNOSIS — R635 Abnormal weight gain: Secondary | ICD-10-CM

## 2022-05-19 NOTE — Patient Instructions (Signed)
Alyssa Fisher , Thank you for taking time to come for your Medicare Wellness Visit. I appreciate your ongoing commitment to your health goals. Please review the following plan we discussed and let me know if I can assist you in the future.   Screening recommendations/referrals: Colonoscopy: done 04/28/16 repeat every 10 years  Mammogram: order placed 05/19/22 Recommended yearly ophthalmology/optometry visit for glaucoma screening and checkup Recommended yearly dental visit for hygiene and checkup  Vaccinations: Influenza vaccine: done 05/02/22 repeat every year  Pneumococcal vaccine: due Tdap vaccine: done 02/01/22 repeat every 10 years  Shingles vaccine: completed 4/14, 02/15/22 Covid-19: completed 05/28/20, 4/7, 07/04/21  Advanced directives: Advance directive discussed with you today. I have provided a copy for you to complete at home and have notarized. Once this is complete please bring a copy in to our office so we can scan it into your chart.  Conditions/risks identified: lose weight   Next appointment: Follow up in one year for your annual wellness visit.   Preventive Care 40-64 Years, Female Preventive care refers to lifestyle choices and visits with your health care provider that can promote health and wellness. What does preventive care include? A yearly physical exam. This is also called an annual well check. Dental exams once or twice a year. Routine eye exams. Ask your health care provider how often you should have your eyes checked. Personal lifestyle choices, including: Daily care of your teeth and gums. Regular physical activity. Eating a healthy diet. Avoiding tobacco and drug use. Limiting alcohol use. Practicing safe sex. Taking low-dose aspirin daily starting at age 36. Taking vitamin and mineral supplements as recommended by your health care provider. What happens during an annual well check? The services and screenings done by your health care provider during your  annual well check will depend on your age, overall health, lifestyle risk factors, and family history of disease. Counseling  Your health care provider may ask you questions about your: Alcohol use. Tobacco use. Drug use. Emotional well-being. Home and relationship well-being. Sexual activity. Eating habits. Work and work Statistician. Method of birth control. Menstrual cycle. Pregnancy history. Screening  You may have the following tests or measurements: Height, weight, and BMI. Blood pressure. Lipid and cholesterol levels. These may be checked every 5 years, or more frequently if you are over 22 years old. Skin check. Lung cancer screening. You may have this screening every year starting at age 79 if you have a 30-pack-year history of smoking and currently smoke or have quit within the past 15 years. Fecal occult blood test (FOBT) of the stool. You may have this test every year starting at age 43. Flexible sigmoidoscopy or colonoscopy. You may have a sigmoidoscopy every 5 years or a colonoscopy every 10 years starting at age 58. Hepatitis C blood test. Hepatitis B blood test. Sexually transmitted disease (STD) testing. Diabetes screening. This is done by checking your blood sugar (glucose) after you have not eaten for a while (fasting). You may have this done every 1-3 years. Mammogram. This may be done every 1-2 years. Talk to your health care provider about when you should start having regular mammograms. This may depend on whether you have a family history of breast cancer. BRCA-related cancer screening. This may be done if you have a family history of breast, ovarian, tubal, or peritoneal cancers. Pelvic exam and Pap test. This may be done every 3 years starting at age 59. Starting at age 56, this may be done every 5 years if  you have a Pap test in combination with an HPV test. Bone density scan. This is done to screen for osteoporosis. You may have this scan if you are at high  risk for osteoporosis. Discuss your test results, treatment options, and if necessary, the need for more tests with your health care provider. Vaccines  Your health care provider may recommend certain vaccines, such as: Influenza vaccine. This is recommended every year. Tetanus, diphtheria, and acellular pertussis (Tdap, Td) vaccine. You may need a Td booster every 10 years. Zoster vaccine. You may need this after age 46. Pneumococcal 13-valent conjugate (PCV13) vaccine. You may need this if you have certain conditions and were not previously vaccinated. Pneumococcal polysaccharide (PPSV23) vaccine. You may need one or two doses if you smoke cigarettes or if you have certain conditions. Talk to your health care provider about which screenings and vaccines you need and how often you need them. This information is not intended to replace advice given to you by your health care provider. Make sure you discuss any questions you have with your health care provider. Document Released: 09/17/2015 Document Revised: 05/10/2016 Document Reviewed: 06/22/2015 Elsevier Interactive Patient Education  2017 [place] Prevention in the Home Falls can cause injuries. They can happen to people of all ages. There are many things you can do to make your home safe and to help prevent falls. What can I do on the outside of my home? Regularly fix the edges of walkways and driveways and fix any cracks. Remove anything that might make you trip as you walk through a door, such as a raised step or threshold. Trim any bushes or trees on the path to your home. Use bright outdoor lighting. Clear any walking paths of anything that might make someone trip, such as rocks or tools. Regularly check to see if handrails are loose or broken. Make sure that both sides of any steps have handrails. Any raised decks and porches should have guardrails on the edges. Have any leaves, snow, or ice cleared regularly. Use  sand or salt on walking paths during winter. Clean up any spills in your garage right away. This includes oil or grease spills. What can I do in the bathroom? Use night lights. Install grab bars by the toilet and in the tub and shower. Do not use towel bars as grab bars. Use non-skid mats or decals in the tub or shower. If you need to sit down in the shower, use a plastic, non-slip stool. Keep the floor dry. Clean up any water that spills on the floor as soon as it happens. Remove soap buildup in the tub or shower regularly. Attach bath mats securely with double-sided non-slip rug tape. Do not have throw rugs and other things on the floor that can make you trip. What can I do in the bedroom? Use night lights. Make sure that you have a light by your bed that is easy to reach. Do not use any sheets or blankets that are too big for your bed. They should not hang down onto the floor. Have a firm chair that has side arms. You can use this for support while you get dressed. Do not have throw rugs and other things on the floor that can make you trip. What can I do in the kitchen? Clean up any spills right away. Avoid walking on wet floors. Keep items that you use a lot in easy-to-reach places. If you need to reach something above  you, use a strong step stool that has a grab bar. Keep electrical cords out of the way. Do not use floor polish or wax that makes floors slippery. If you must use wax, use non-skid floor wax. Do not have throw rugs and other things on the floor that can make you trip. What can I do with my stairs? Do not leave any items on the stairs. Make sure that there are handrails on both sides of the stairs and use them. Fix handrails that are broken or loose. Make sure that handrails are as long as the stairways. Check any carpeting to make sure that it is firmly attached to the stairs. Fix any carpet that is loose or worn. Avoid having throw rugs at the top or bottom of the  stairs. If you do have throw rugs, attach them to the floor with carpet tape. Make sure that you have a light switch at the top of the stairs and the bottom of the stairs. If you do not have them, ask someone to add them for you. What else can I do to help prevent falls? Wear shoes that: Do not have high heels. Have rubber bottoms. Are comfortable and fit you well. Are closed at the toe. Do not wear sandals. If you use a stepladder: Make sure that it is fully opened. Do not climb a closed stepladder. Make sure that both sides of the stepladder are locked into place. Ask someone to hold it for you, if possible. Clearly mark and make sure that you can see: Any grab bars or handrails. First and last steps. Where the edge of each step is. Use tools that help you move around (mobility aids) if they are needed. These include: Canes. Walkers. Scooters. Crutches. Turn on the lights when you go into a dark area. Replace any light bulbs as soon as they burn out. Set up your furniture so you have a clear path. Avoid moving your furniture around. If any of your floors are uneven, fix them. If there are any pets around you, be aware of where they are. Review your medicines with your doctor. Some medicines can make you feel dizzy. This can increase your chance of falling. Ask your doctor what other things that you can do to help prevent falls. This information is not intended to replace advice given to you by your health care provider. Make sure you discuss any questions you have with your health care provider. Document Released: 06/17/2009 Document Revised: 01/27/2016 Document Reviewed: 09/25/2014 Elsevier Interactive Patient Education  2017 [employer].

## 2022-05-19 NOTE — Progress Notes (Signed)
Virtual Visit via Telephone Note  I connected with  Alyssa Fisher on 05/19/22 at 10:15 AM EDT by telephone and verified that I am speaking with the correct person using two identifiers.  Medicare Annual Wellness visit completed telephonically due to Covid-19 pandemic.   Persons participating in this call: This Health Coach and this patient.   Location: Patient: home Provider: office    I discussed the limitations, risks, security and privacy concerns of performing an evaluation and management service by telephone and the availability of in person appointments. The patient expressed understanding and agreed to proceed.  Unable to perform video visit due to video visit attempted and failed and/or patient does not have video capability.   Some vital signs may be absent or patient reported.   Willette Brace, LPN   Subjective:   Alyssa Fisher is a 56 y.o. female who presents for an Initial Medicare Annual Wellness Visit.  Review of Systems     Cardiac Risk Factors include: hypertension;obesity (BMI >30kg/m2)     Objective:    There were no vitals filed for this visit. There is no height or weight on file to calculate BMI.     05/19/2022   10:12 AM 12/19/2018    6:36 AM 12/18/2018   12:08 PM 09/19/2017    9:03 AM 02/21/2017    3:00 PM 02/13/2017    2:16 PM 04/28/2016    7:54 AM  Advanced Directives  Does Patient Have a Medical Advance Directive? No No No No No No No  Type of Academic librarian;Living will        Does patient want to make changes to medical advance directive? Yes (MAU/Ambulatory/Procedural Areas - Information given)        Would patient like information on creating a medical advance directive? Yes (MAU/Ambulatory/Procedural Areas - Information given) No - Patient declined No - Patient declined No - Patient declined Yes (ED - Information included in AVS) Yes (MAU/Ambulatory/Procedural Areas - Information given)     Current Medications  (verified) Outpatient Encounter Medications as of 05/19/2022  Medication Sig   ALPRAZolam (XANAX) 0.25 MG tablet Take 1 tablet (0.25 mg total) by mouth daily as needed for anxiety.   cetirizine (ZYRTEC) 10 MG tablet Take 10 mg by mouth daily.   diltiazem (DILACOR XR) 120 MG 24 hr capsule Take 1 capsule (120 mg total) by mouth daily.   escitalopram (LEXAPRO) 20 MG tablet Take 20 mg by mouth daily.   levothyroxine (SYNTHROID) 75 MCG tablet TAKE 2 TABLETS (150 MCG TOTAL) BY MOUTH DAILY BEFORE BREAKFAST.   meloxicam (MOBIC) 7.5 MG tablet Take 1 tablet (7.5 mg total) by mouth in the morning and at bedtime.   montelukast (SINGULAIR) 10 MG tablet TAKE 1 TABLET BY MOUTH EVERYDAY AT BEDTIME   omeprazole (PRILOSEC) 20 MG capsule omeprazole 20 mg capsule,delayed release   zolpidem (AMBIEN) 5 MG tablet Take 1 tablet (5 mg total) by mouth at bedtime as needed for sleep.   [DISCONTINUED] hydrOXYzine (ATARAX) 10 MG tablet Take 1-2 tablets (10-20 mg total) by mouth 3 (three) times daily as needed for anxiety.   No facility-administered encounter medications on file as of 05/19/2022.    Allergies (verified) Augmentin [amoxicillin-pot clavulanate] and Codeine   History: Past Medical History:  Diagnosis Date   Anxiety    Broken ankle    Chronic left-sided lumbar radiculopathy 01/05/2022   Contraceptive management 11/21/2012   Depression    Dizziness - light-headed/fainting 02/20/2011  Dysphagia 10/23/2012   due to thyroid issues   Dysphagia 03/01/2011   Fatigue/Loss of Sleep 02/20/2011   FHx: allergies 02/20/2011   Ask patient to clarify. Only had "allergies" noted. Need more detail per medical history form dated 01/18/11.   Generalized headaches 02/20/2011   GERD (gastroesophageal reflux disease)    History of tonsillectomy    Hoarseness/sore throat 02/20/2011   Hypertension    Miscarriage    One - date not on medical form.   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   Rosacea    Runny nose 02/20/2011   Seasonal allergies    uses albuterol inhaler as needed for allergies   Sinus problem 02/20/2011   Thyroid goiter 10/23/2012   multinodular- has been followed x 67yr   Wears glasses/contacts 02/20/2011   Past Surgical History:  Procedure Laterality Date   ABDOMINAL HYSTERECTOMY     ANTERIOR AND POSTERIOR REPAIR N/A 02/21/2017   Procedure: ANTERIOR (CYSTOCELE) AND POSTERIOR REPAIR (RECTOCELE);  Surgeon: MCheri Fowler MD;  [facility];  Service: Gynecology;  Laterality: N/A;   BILATERAL SALPINGECTOMY N/A 02/21/2017   Procedure: BILATERAL SALPINGECTOMY;  Surgeon: MCheri Fowler MD;  [facility];  Service: Gynecology;  Laterality: N/A;   BLADDER SUSPENSION N/A 02/21/2017   Procedure: Solyx Sling;  Surgeon: MCheri Fowler MD;  [facility];  Service: Gynecology;  Laterality: N/A;   broken ankle Right    Pins/rod inserted-retained   COLONOSCOPY     DILATION AND CURETTAGE OF UTERUS     MAB   FRACTURE SURGERY     ORIF ANKLE FRACTURE Right 12/19/2018   Procedure: Removal of hardware and open reduction internal fixation of right ankle bimalleolar fracture;  Surgeon: HWylene Simmer MD;  [place] [facility]  Service: Orthopedics;  Laterality: Right;  968m [facility]   THYROIDECTOMY N/A 10/29/2012   Procedure: THYROIDECTOMY;  Surgeon: ToEarnstine RegalMD;  [facility];  Service: General;  Laterality: N/A;  Total Thyroidectomy   TONSILLECTOMY     VAGINAL HYSTERECTOMY Bilateral 02/21/2017   Procedure: HYSTERECTOMY VAGINAL;  Surgeon: MeCheri FowlerMD;  [facility];  Service: Gynecology;  Laterality: Bilateral;   WISDOM TOOTH EXTRACTION     Family History  Problem Relation Age of Onset   Arthritis Mother    Cancer Mother    Heart disease Mother    Hypertension Mother     Kidney disease Mother    Obesity Mother    Heart disease Father    Alcohol abuse Father    Vision loss Father    Colon polyps Maternal Grandmother    Social History   Socioeconomic History   Marital status: Legally Separated    Spouse name: Not on file   Number of children: 5   Years of education: Not on file   Highest education level: Not on file  Occupational History   Not on file  Tobacco Use   Smoking status: Never   Smokeless tobacco: Never  Vaping Use   Vaping Use: Never used  Substance and Sexual Activity   Alcohol use: Not Currently    Comment: rare -social   Drug use: No   Sexual activity: Not Currently    Birth control/protection: Abstinence, Condom, Post-menopausal  Other Topics Concern   Not on  file  Social History Narrative   Not on file   Social Determinants of Health   Financial Resource Strain: Not on file  Food Insecurity: Not on file  Transportation Needs: Not on file  Physical Activity: Inactive (05/19/2022)   Exercise Vital Sign    Days of Exercise per Week: 0 days    Minutes of Exercise per Session: 0 min  Stress: Stress Concern Present (05/19/2022)   [place]    Feeling of Stress : To some extent  Social Connections: Moderately Integrated (05/19/2022)   Social Connection and Isolation Panel [NHANES]    Frequency of Communication with Friends and Family: More than three times a week    Frequency of Social Gatherings with Friends and Family: More than three times a week    Attends Religious Services: More than 4 times per year    Active Member of [employer] or Organizations: No    Attends Music therapist: Never    Marital Status: Married    Tobacco Counseling Counseling given: Not Answered   Clinical Intake:  Pre-visit preparation completed: Yes  Pain : No/denies pain     BMI - recorded: 30.16 Nutritional Status: BMI > 30  Obese Nutritional Risks:  None Diabetes: No  How often do you need to have someone help you when you read instructions, pamphlets, or other written materials from your doctor or pharmacy?: 1 - Never  Diabetic?no  Interpreter Needed?: No  Information entered by :: Charlott Rakes, LPN   Activities of Daily Living    05/19/2022   10:14 AM  In your present state of health, do you have any difficulty performing the following activities:  Hearing? 0  Vision? 0  Difficulty concentrating or making decisions? 0  Walking or climbing stairs? 0  Dressing or bathing? 0  Doing errands, shopping? 0  Preparing Food and eating ? N  Using the Toilet? N  In the past six months, have you accidently leaked urine? N  Do you have problems with loss of bowel control? N  Managing your Medications? N  Managing your Finances? N  Housekeeping or managing your Housekeeping? N    Patient Care Team: Jeanie Sewer, NP as PCP - General (Family Medicine)  Indicate any recent Medical Services you may have received from other than [facility] providers in the past year (date may be approximate).     Assessment:   This is a routine wellness examination for Alyssa Fisher.  Hearing/Vision screen Hearing Screening - Comments:: Pt denies any hearing issues  Vision Screening - Comments:: [facility]    Dietary issues and exercise activities discussed: Current Exercise Habits: The patient does not participate in regular exercise at present   Goals Addressed             This Visit's Progress    Patient Stated       Lose weight        Depression Screen    05/19/2022   10:10 AM 01/05/2022   10:38 AM  PHQ 2/9 Scores  PHQ - 2 Score 1 0    Fall Risk    05/19/2022   10:13 AM  [place] in the past year? 1  Number falls in past yr: 1  Injury with Fall? 1  Comment foot sprain  Risk for fall due to : No Fall Risks;Impaired vision;Impaired mobility  Risk for fall due to: Comment related to  sight  concerns  Follow up Falls prevention discussed    [place]:  Any stairs in or around the home? Yes  If so, are there any without handrails? No  Home free of loose throw rugs in walkways, pet beds, electrical cords, etc? Yes  Adequate lighting in your home to reduce risk of falls? Yes   ASSISTIVE DEVICES UTILIZED TO PREVENT FALLS:  Life alert? No  Use of a cane, walker or w/c? Yes  Grab bars in the bathroom? No  Shower chair or bench in shower? No  Elevated toilet seat or a handicapped toilet? No   TIMED UP AND GO:  Was the test performed? No .   Cognitive Function:        05/19/2022   10:16 AM  6CIT Screen  What Year? 0 points  What month? 0 points  What time? 0 points  Count back from 20 0 points  Months in reverse 0 points  Repeat phrase 0 points  Total Score 0 points    Immunizations Immunization History  Administered Date(s) Administered   Influenza Split 06/04/2012, 06/09/2013   Influenza, Seasonal, Injecte, Preservative Fre 05/05/2014   Influenza,inj,Quad PF,6+ Mos 05/02/2022   Influenza-Unspecified 05/05/2018   PFIZER(Purple Top)SARS-COV-2 Vaccination 05/28/2020, 12/09/2020   Pfizer Covid-19 Vaccine Bivalent Booster 74yr & up 07/04/2021   Tdap 09/04/2008, 02/01/2022   Zoster Recombinat (Shingrix) 12/16/2021, 02/17/2022    TDAP status: Up to date  Flu Vaccine status: Up to date  Pneumococcal vaccine status: Due, Education has been provided regarding the importance of this vaccine. Advised may receive this vaccine at local pharmacy or Health Dept. Aware to provide a copy of the vaccination record if obtained from local pharmacy or Health Dept. Verbalized acceptance and understanding.  Covid-19 vaccine status: Completed vaccines  Qualifies for Shingles Vaccine? Yes   Zostavax completed Yes   Shingrix Completed?: Yes  Screening Tests Health Maintenance  Topic Date Due   HIV Screening  Never done   Hepatitis C  Screening  Never done   PAP SMEAR-Modifier  10/18/2014   MAMMOGRAM  09/23/2017   COLONOSCOPY (Pts 45-454yrInsurance coverage will need to be confirmed)  04/28/2026   TETANUS/TDAP  02/02/2032   INFLUENZA VACCINE  Completed   COVID-19 Vaccine  Completed   Zoster Vaccines- Shingrix  Completed   HPV VACCINES  Aged Out    Health Maintenance  Health Maintenance Due  Topic Date Due   HIV Screening  Never done   Hepatitis C Screening  Never done   PAP SMEAR-Modifier  10/18/2014   MAMMOGRAM  09/23/2017    Colorectal cancer screening: Type of screening: Colonoscopy. Completed 04/28/16. Repeat every 10 years  Mammogram status: Ordered 05/19/22. Pt provided with contact info and advised to call to schedule appt.     Additional Screening:  Hepatitis C Screening: does qualify;  Vision Screening: Recommended annual ophthalmology exams for early detection of glaucoma and other disorders of the eye. Is the patient up to date with their annual eye exam?  Yes  Who is the provider or what is the name of the office in which the patient attends annual eye exams[facility]  If pt is not established with a provider, would they like to be referred to a provider to establish care? No .   Dental Screening: Recommended annual dental exams for proper oral hygiene  Community Resource Referral / Chronic Care Management: CRR required this visit?  No   CCM  required this visit?  No      Plan:     I have personally reviewed and noted the following in the patient's chart:   Medical and social history Use of alcohol, tobacco or illicit drugs  Current medications and supplements including opioid prescriptions. Patient is not currently taking opioid prescriptions. Functional ability and status Nutritional status Physical activity Advanced directives List of other physicians Hospitalizations, surgeries, and ER visits in previous 12 months Vitals Screenings to include cognitive,  depression, and falls Referrals and appointments  In addition, I have reviewed and discussed with patient certain preventive protocols, quality metrics, and best practice recommendations. A written personalized care plan for preventive services as well as general preventive health recommendations were provided to patient.     Willette Brace, LPN   [id number]   Nurse Notes: none

## 2022-05-21 ENCOUNTER — Other Ambulatory Visit: Payer: Self-pay | Admitting: Family

## 2022-05-21 DIAGNOSIS — F419 Anxiety disorder, unspecified: Secondary | ICD-10-CM

## 2022-05-22 ENCOUNTER — Other Ambulatory Visit: Payer: Medicare Other

## 2022-05-22 DIAGNOSIS — E669 Obesity, unspecified: Secondary | ICD-10-CM

## 2022-05-22 DIAGNOSIS — E7800 Pure hypercholesterolemia, unspecified: Secondary | ICD-10-CM | POA: Insufficient documentation

## 2022-05-22 HISTORY — DX: Pure hypercholesterolemia, unspecified: E78.00

## 2022-05-22 HISTORY — DX: Obesity, unspecified: E66.9

## 2022-05-22 MED ORDER — OZEMPIC (0.25 OR 0.5 MG/DOSE) 2 MG/3ML ~~LOC~~ SOPN: 0.2500 mg | PEN_INJECTOR | SUBCUTANEOUS | 0 refills | Status: DC

## 2022-05-22 NOTE — Telephone Encounter (Signed)
No PA needed, Pt has picked up RX.

## 2022-05-28 ENCOUNTER — Ambulatory Visit
Admission: RE | Admit: 2022-05-28 | Discharge: 2022-05-28 | Disposition: A | Discharge status: Home / Self Care | Payer: Medicare Other | Source: Ambulatory Visit | Attending: Orthopaedic Surgery | Admitting: Orthopaedic Surgery

## 2022-05-28 DIAGNOSIS — M25552 Pain in left hip: Secondary | ICD-10-CM | POA: Diagnosis not present

## 2022-05-28 DIAGNOSIS — M67959 Unspecified disorder of synovium and tendon, unspecified thigh: Secondary | ICD-10-CM

## 2022-05-30 ENCOUNTER — Encounter (HOSPITAL_BASED_OUTPATIENT_CLINIC_OR_DEPARTMENT_OTHER): Payer: Self-pay | Admitting: Orthopaedic Surgery

## 2022-06-02 ENCOUNTER — Ambulatory Visit (INDEPENDENT_AMBULATORY_CARE_PROVIDER_SITE_OTHER): Payer: Medicare Other | Admitting: Orthopaedic Surgery

## 2022-06-02 ENCOUNTER — Ambulatory Visit (INDEPENDENT_AMBULATORY_CARE_PROVIDER_SITE_OTHER): Payer: Medicare Other

## 2022-06-02 DIAGNOSIS — G8929 Other chronic pain: Secondary | ICD-10-CM

## 2022-06-02 DIAGNOSIS — M545 Low back pain, unspecified: Secondary | ICD-10-CM | POA: Diagnosis not present

## 2022-06-02 NOTE — Progress Notes (Signed)
Chief Complaint: Left hip lower back pain     History of Present Illness:   06/02/2022: Presents today for follow-up of her MRI hip.  Her pain has been consistently involving the lower back area into the lateral thigh.  She did not get any relief from ultrasound-guided gluteus medius injection.  Alyssa Fisher is a 56 y.o. female presents with over 10 years of left-sided back pain with some radiation down the thigh.  She has previously taken tramadol and Mobic for this which does help her.  She has undergone 2 rounds of physical therapy for several months with minimal relief.  She has not had any injections.  She did have a fall down the steps in 2011 at which time the pain began on the left side.  She describes it as a pinching feeling.  She is having a hard time laying directly on the side.  She is very active with her children and grandchildren.  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    Surgical History:   None  PMH/PSH/Family History/Social History/Meds/Allergies:    Past Medical History:  Diagnosis Date   Anxiety    Broken ankle    Chronic left-sided lumbar radiculopathy 01/05/2022   Contraceptive management 11/21/2012   Depression    Dizziness - light-headed/fainting 02/20/2011   Dysphagia 10/23/2012   due to thyroid issues   Dysphagia 03/01/2011   Fatigue/Loss of Sleep 02/20/2011   FHx: allergies 02/20/2011   Ask patient to clarify. Only had "allergies" noted. Need more detail per medical history form dated 01/18/11.   Generalized headaches 02/20/2011   GERD (gastroesophageal reflux disease)    History of tonsillectomy    Hoarseness/sore throat 02/20/2011   Hypertension    Miscarriage    One - date not on medical form.   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    Rosacea    Runny nose 02/20/2011   Seasonal allergies    uses albuterol inhaler as needed for allergies   Sinus problem 02/20/2011   Thyroid goiter 10/23/2012   multinodular- has been followed x 94yr   Wears glasses/contacts 02/20/2011   Past Surgical History:  Procedure Laterality Date   ABDOMINAL HYSTERECTOMY     ANTERIOR AND POSTERIOR REPAIR N/A 02/21/2017   Procedure: ANTERIOR (CYSTOCELE) AND POSTERIOR REPAIR (RECTOCELE);  Surgeon: MCheri Fowler MD;  [facility];  Service: Gynecology;  Laterality: N/A;   BILATERAL SALPINGECTOMY N/A 02/21/2017   Procedure: BILATERAL SALPINGECTOMY;  Surgeon: MCheri Fowler MD;  [facility];  Service: Gynecology;  Laterality: N/A;   BLADDER SUSPENSION N/A 02/21/2017   Procedure: Solyx Sling;  Surgeon: MCheri Fowler MD;  [facility];  Service: Gynecology;  Laterality: N/A;   broken ankle Right    Pins/rod inserted-retained   COLONOSCOPY     DILATION AND CURETTAGE OF UTERUS     MAB   FRACTURE SURGERY     ORIF ANKLE FRACTURE Right 12/19/2018   Procedure: Removal of hardware and open reduction internal fixation of right ankle bimalleolar fracture;  Surgeon: HWylene Simmer MD;  [facility]  Service: Orthopedics;  Laterality: Right;  [facility]   THYROIDECTOMY N/A 10/29/2012   Procedure: THYROIDECTOMY;  Surgeon: TEarnstine Regal MD;  [facility];  Service: General;  Laterality: N/A;  Total Thyroidectomy   TONSILLECTOMY     VAGINAL HYSTERECTOMY Bilateral 02/21/2017   Procedure: HYSTERECTOMY VAGINAL;  Surgeon: MCheri Fowler MD;  [facility];  Service: Gynecology;  Laterality: Bilateral;   WISDOM TOOTH EXTRACTION     Social History   Socioeconomic History   Marital status: Legally Separated    Spouse name: Not on file   Number of children: 5   Years of education: Not on file   Highest education level: Not on file  Occupational History   Not on file  Tobacco Use    Smoking status: Never   Smokeless tobacco: Never  Vaping Use   Vaping Use: Never used  Substance and Sexual Activity   Alcohol use: Not Currently    Comment: rare -social   Drug use: No   Sexual activity: Not Currently    Birth control/protection: Abstinence, Condom, Post-menopausal  Other Topics Concern   Not on file  Social History Narrative   Not on file   Social Determinants of Health   Financial Resource Strain: Not on file  Food Insecurity: Not on file  Transportation Needs: Not on file  Physical Activity: Inactive (05/19/2022)   Exercise Vital Sign    Days of Exercise per Week: 0 days    Minutes of Exercise per Session: 0 min  Stress: Stress Concern Present (05/19/2022)   [facility]   Feeling of Stress : To some extent  Social Connections: Moderately Integrated (05/19/2022)   Social Connection and Isolation Panel [NHANES]    Frequency of Communication with Friends and Family: More than three times a week    Frequency of Social Gatherings with Friends and Family: More than three times a week    Attends Religious Services: More than 4 times per year    Active Member of [employer] Organizations: No    Attends CM[employer] therapist Never    Marital Status: Married   Family History  Problem Relation Age of Onset   Arthritis Mother    Cancer Mother    Heart disease Mother    Hypertension Mother    Kidney disease Mother    Obesity Mother    Heart disease Father    Alcohol abuse Father    Vision loss Father    Colon polyps Maternal Grandmother    Allergies  Allergen Reactions   Augmentin [Amoxicillin-Pot Clavulanate] Diarrhea   Codeine     Patient stated "possible" due to mother and grandparents being allergic as well. Per medical history form dated 01/18/11. Prefers not to have.   Current Outpatient Medications  Medication Sig Dispense Refill   ALPRAZolam (XANAX) 0.25 MG tablet Take 1  tablet (0.25 mg total) by mouth daily as needed for anxiety. 30 tablet 2   cetirizine (ZYRTEC) 10 MG tablet Take 10 mg by mouth daily.     diltiazem (DILACOR XR) 120 MG 24 hr capsule Take 1 capsule (120 mg total) by mouth daily. 90 capsule 1   escitalopram (LEXAPRO) 20 MG tablet Take 20 mg by mouth daily.     levothyroxine (SYNTHROID) 75 MCG tablet TAKE 2 TABLETS (150 MCG TOTAL) BY MOUTH DAILY BEFORE BREAKFAST. 90 tablet 1   meloxicam (MOBIC) 7.5 MG tablet Take 1 tablet (7.5 mg total)  by mouth in the morning and at bedtime. 60 tablet 2   montelukast (SINGULAIR) 10 MG tablet TAKE 1 TABLET BY MOUTH EVERYDAY AT BEDTIME 60 tablet 0   omeprazole (PRILOSEC) 20 MG capsule omeprazole 20 mg capsule,delayed release     Semaglutide,0.25 or 0.'5MG'$ /DOS, (OZEMPIC, 0.25 OR 0.5 MG/DOSE,) 2 MG/3ML SOPN Inject 0.25 mg into the skin once a week. 3 mL 0   zolpidem (AMBIEN) 5 MG tablet Take 1 tablet (5 mg total) by mouth at bedtime as needed for sleep. 30 tablet 5   No current facility-administered medications for this visit.   No results found.  Review of Systems:   A ROS was performed including pertinent positives and negatives as documented in the HPI.  Physical Exam :   Constitutional: NAD and appears stated age Neurological: Alert and oriented Psych: Appropriate affect and cooperative Last menstrual period 10/12/2012.   Comprehensive Musculoskeletal Exam:    Worsening pain with straight leg raise.  No radiation or weakness in the left lower leg.  Reflexes symmetric and equal.  Remainder of neurosensory exam is intact  At today's visit there is tenderness to palpation about the greater trochanter as well with radiation to the gluteal area as well as a positive Trendelenburg.  Imaging:    X-ray 4 views lumbar spine There is a scoliotic deformity involving the lumbar spine.  I personally reviewed and interpreted the radiographs.   Assessment:   56 y.o. female today with left hip pain today I do  believe consistent more with a spinal etiology.  She does have an x-ray today consistent with scoliosis involving the lumbar spine.  I did discuss that there does appear to be some disc herniation in the some articular recess in the L4-L5 disc space on the left side.  While this is not extremely significant she does have a scoliotic deformity that is leaning towards the side and likely causing neuroforaminal impingement as well.  At this time I would like to plan to refer her to my partner Dr. Ernestina Patches for a left L4-L5 injection so that we can hopefully ascertain if this is the true source of her pain. Plan :    -Plan for referral to Dr. Ernestina Patches for x-ray guided lumbar injection    I personally saw and evaluated the patient, and participated in the management and treatment plan.  Vanetta Mulders, MD Attending Physician, Orthopedic Surgery  This document was dictated using Dragon voice recognition software. A reasonable attempt at proof reading has been made to minimize errors.

## 2022-06-09 ENCOUNTER — Encounter: Payer: Self-pay | Admitting: Family

## 2022-06-09 ENCOUNTER — Ambulatory Visit (INDEPENDENT_AMBULATORY_CARE_PROVIDER_SITE_OTHER): Payer: Medicare Other | Admitting: Family

## 2022-06-09 DIAGNOSIS — R635 Abnormal weight gain: Secondary | ICD-10-CM

## 2022-06-09 DIAGNOSIS — E669 Obesity, unspecified: Secondary | ICD-10-CM

## 2022-06-09 MED ORDER — OZEMPIC (0.25 OR 0.5 MG/DOSE) 2 MG/3ML ~~LOC~~ SOPN: 0.5000 mg | PEN_INJECTOR | SUBCUTANEOUS | 0 refills | Status: DC

## 2022-06-09 MED ORDER — OZEMPIC (0.25 OR 0.5 MG/DOSE) 2 MG/3ML ~~LOC~~ SOPN: 0.5000 mg | PEN_INJECTOR | SUBCUTANEOUS | 1 refills | Status: DC

## 2022-06-09 NOTE — Progress Notes (Signed)
Patient ID: Alyssa Fisher, female    DOB: 1966-02-14, 56 y.o.   MRN: [id number]  Chief Complaint  Patient presents with   Weight Loss    Follow up    HPI: Abnormal weight gain:  pt denies ever trying previous wt loss programs or medications. Reports trying a "Keto-like" diet, with exercise and lost about 20lbs. She is unable to exercise at the gym as her mother's health has been bad and she is her transportation d/t pt unable to drive d/t poor vision. She reports since having her thyroid removed, her weight has been up and down. Started Ozempic 3 weeks ago, tolerating better now.  Assessment & Plan:   Problem List Items Addressed This Visit       Other   Abnormal weight gain    chronic taking Ozempic 0.'25mg'$  qweekly had nausea in the beginning, decreased appetite, stopped drinking sweet sodas, but now feeling hungry again increasing dose to 0.'5mg'$  qweek f/u in 2 mos or prn      Relevant Medications   Semaglutide,0.25 or 0.'5MG'$ /DOS, (OZEMPIC, 0.25 OR 0.5 MG/DOSE,) 2 MG/3ML SOPN   Obesity (BMI 30-39.9)    chronic increasing Ozempic 0.'5mg'$  qweek continue to reinforce wt loss strategies      Relevant Medications   Semaglutide,0.25 or 0.'5MG'$ /DOS, (OZEMPIC, 0.25 OR 0.5 MG/DOSE,) 2 MG/3ML SOPN    Subjective:    Outpatient Medications Prior to Visit  Medication Sig Dispense Refill   ALPRAZolam (XANAX) 0.25 MG tablet Take 1 tablet (0.25 mg total) by mouth daily as needed for anxiety. 30 tablet 2   cetirizine (ZYRTEC) 10 MG tablet Take 10 mg by mouth daily.     diltiazem (DILACOR XR) 120 MG 24 hr capsule Take 1 capsule (120 mg total) by mouth daily. 90 capsule 1   escitalopram (LEXAPRO) 20 MG tablet Take 20 mg by mouth daily.     levothyroxine (SYNTHROID) 75 MCG tablet TAKE 2 TABLETS (150 MCG TOTAL) BY MOUTH DAILY BEFORE BREAKFAST. 90 tablet 1   meloxicam (MOBIC) 7.5 MG tablet Take 1 tablet (7.5 mg total) by mouth in the morning and at bedtime. 60 tablet 2   montelukast (SINGULAIR) 10  MG tablet TAKE 1 TABLET BY MOUTH EVERYDAY AT BEDTIME 60 tablet 0   omeprazole (PRILOSEC) 20 MG capsule omeprazole 20 mg capsule,delayed release     zolpidem (AMBIEN) 5 MG tablet Take 1 tablet (5 mg total) by mouth at bedtime as needed for sleep. 30 tablet 5   Semaglutide,0.25 or 0.'5MG'$ /DOS, (OZEMPIC, 0.25 OR 0.5 MG/DOSE,) 2 MG/3ML SOPN Inject 0.25 mg into the skin once a week. 3 mL 0   No facility-administered medications prior to visit.   Past Medical History:  Diagnosis Date   Anxiety    Broken ankle    Chronic left-sided lumbar radiculopathy 01/05/2022   Contraceptive management 11/21/2012   Depression    Dizziness - light-headed/fainting 02/20/2011   Dysphagia 10/23/2012   due to thyroid issues   Dysphagia 03/01/2011   Fatigue/Loss of Sleep 02/20/2011   FHx: allergies 02/20/2011   Ask patient to clarify. Only had "allergies" noted. Need more detail per medical history form dated 01/18/11.   Generalized headaches 02/20/2011   GERD (gastroesophageal reflux disease)    History of tonsillectomy    Hoarseness/sore throat 02/20/2011   Hypertension    Miscarriage    One - date not on medical form.   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   Rosacea    Runny nose 02/20/2011   Seasonal allergies    uses albuterol inhaler as needed for allergies   Sinus problem 02/20/2011   Thyroid goiter 10/23/2012   multinodular- has been followed x 11yr   Wears glasses/contacts 02/20/2011   Past Surgical History:  Procedure Laterality Date   ABDOMINAL HYSTERECTOMY     ANTERIOR AND POSTERIOR REPAIR N/A 02/21/2017   Procedure: ANTERIOR (CYSTOCELE) AND POSTERIOR REPAIR (RECTOCELE);  Surgeon: MCheri Fowler MD;  [facility];  Service: Gynecology;  Laterality: N/A;   BILATERAL SALPINGECTOMY N/A 02/21/2017   Procedure: BILATERAL SALPINGECTOMY;  Surgeon:  MCheri Fowler MD;  [facility];  Service: Gynecology;  Laterality: N/A;   BLADDER SUSPENSION N/A 02/21/2017   Procedure: Solyx Sling;  Surgeon: MCheri Fowler MD;  [facility];  Service: Gynecology;  Laterality: N/A;   broken ankle Right    Pins/rod inserted-retained   COLONOSCOPY     DILATION AND CURETTAGE OF UTERUS     MAB   FRACTURE SURGERY     ORIF ANKLE FRACTURE Right 12/19/2018   Procedure: Removal of hardware and open reduction internal fixation of right ankle bimalleolar fracture;  Surgeon: HWylene Simmer MD;  [facility]  Service: Orthopedics;  Laterality: Right;  [facility]   THYROIDECTOMY N/A 10/29/2012   Procedure: THYROIDECTOMY;  Surgeon: ToEarnstine RegalMD;  [facility];  Service: General;  Laterality: N/A;  Total Thyroidectomy   TONSILLECTOMY     VAGINAL HYSTERECTOMY Bilateral 02/21/2017   Procedure: HYSTERECTOMY VAGINAL;  Surgeon: MeCheri FowlerMD;  [facility];  Service: Gynecology;  Laterality: Bilateral;   WISDOM TOOTH EXTRACTION     Allergies  Allergen Reactions   Augmentin [Amoxicillin-Pot Clavulanate] Diarrhea   Codeine     Patient stated "possible" due to mother and grandparents being allergic as well. Per medical history form dated 01/18/11. Prefers not to have.      Objective:    Physical Exam Vitals and nursing note reviewed.  Constitutional:      Appearance: Normal appearance. She is obese.  Cardiovascular:     Rate and Rhythm: Normal rate and regular rhythm.  Pulmonary:     Effort: Pulmonary effort is normal.     Breath sounds: Normal breath sounds.  Musculoskeletal:        General: Normal range of motion.  Skin:    General: Skin is warm and dry.  Neurological:     Mental Status: She is alert.  Psychiatric:        Mood and Affect: Mood normal.        Behavior: Behavior normal.    BP 129/81 (BP Location: Left Arm, Patient Position: Sitting, Cuff Size: Large)    Pulse 75   Temp 98.1 F (36.7 C) (Temporal)   Ht '5\' 5"'$  (1.651 m)   Wt 181 lb (82.1 kg)   LMP 10/12/2012   SpO2 96%   BMI 30.12 kg/m  Wt Readings from Last 3 Encounters:  06/09/22 181 lb (82.1 kg)  05/02/22 181 lb 4 oz (82.2 kg)  04/07/22 176 lb (79.8 kg)       HuJeanie SewerNP

## 2022-06-09 NOTE — Assessment & Plan Note (Signed)
   chronic  increasing Ozempic 0.'5mg'$  qweek  continue to reinforce wt loss strategies

## 2022-06-09 NOTE — Assessment & Plan Note (Addendum)
   chronic  taking Ozempic 0.'25mg'$  qweekly  had nausea in the beginning, decreased appetite, stopped drinking sweet sodas, but now feeling hungry again  increasing dose to 0.'5mg'$  qweek  f/u in 2 mos or prn

## 2022-06-14 ENCOUNTER — Ambulatory Visit (HOSPITAL_BASED_OUTPATIENT_CLINIC_OR_DEPARTMENT_OTHER): Payer: Medicare Other | Admitting: Orthopaedic Surgery

## 2022-06-19 ENCOUNTER — Ambulatory Visit: Payer: Self-pay

## 2022-06-19 ENCOUNTER — Ambulatory Visit (INDEPENDENT_AMBULATORY_CARE_PROVIDER_SITE_OTHER): Payer: Medicare Other | Admitting: Physical Medicine and Rehabilitation

## 2022-06-19 VITALS — BP 105/73 | HR 74

## 2022-06-19 DIAGNOSIS — M5416 Radiculopathy, lumbar region: Secondary | ICD-10-CM

## 2022-06-19 MED ORDER — METHYLPREDNISOLONE ACETATE 80 MG/ML IJ SUSP
40.0000 mg | Freq: Once | INTRAMUSCULAR | Status: AC
  Administered 2022-06-19: 40 mg

## 2022-06-19 NOTE — Progress Notes (Signed)
Numeric Pain Rating Scale and Functional Assessment Average Pain 5   In the last MONTH (on 0-10 scale) has pain interfered with the following?  1. General activity like being  able to carry out your everyday physical activities such as walking, climbing stairs, carrying groceries, or moving a chair?  Rating(8)   +Driver, -BT, -Dye Allergies.  The longer the activity and the longer she does the activity makes the pain worse. Takes Meloxicam for pain

## 2022-06-19 NOTE — Patient Instructions (Signed)

## 2022-06-22 ENCOUNTER — Ambulatory Visit
Admission: RE | Admit: 2022-06-22 | Discharge: 2022-06-22 | Disposition: A | Discharge status: Home / Self Care | Payer: Medicare Other | Source: Ambulatory Visit | Attending: Family | Admitting: Family

## 2022-06-22 DIAGNOSIS — Z1231 Encounter for screening mammogram for malignant neoplasm of breast: Secondary | ICD-10-CM

## 2022-06-23 ENCOUNTER — Other Ambulatory Visit: Payer: Self-pay | Admitting: Family

## 2022-06-29 NOTE — Progress Notes (Signed)
Mammogram normal! Recheck in 1 year.

## 2022-07-02 NOTE — Progress Notes (Signed)
Alyssa Fisher - 56 y.o. female MRN [id number]  Date of birth: 1966/07/03  Office Visit Note: Visit Date: 06/19/2022 PCP: Jeanie Sewer, NP Referred by: Jeanie Sewer, NP  Subjective: Chief Complaint  Patient presents with   Lower Back - Pain   HPI:  Alyssa Fisher is a 56 y.o. female who comes in today at the request of Dr. Vanetta Mulders for planned Left L4-5 Lumbar Interlaminar epidural steroid injection with fluoroscopic guidance.  The patient has failed conservative care including home exercise, medications, time and activity modification.  This injection will be diagnostic and hopefully therapeutic.  Please see requesting physician notes for further details and justification.   ROS Otherwise per HPI.  Assessment & Plan: Visit Diagnoses:    ICD-10-CM   1. Lumbar radiculopathy  M54.16 XR C-ARM NO REPORT    methylPREDNISolone acetate (DEPO-MEDROL) injection 40 mg    Epidural Steroid injection      Plan: No additional findings.   Meds & Orders:  Meds ordered this encounter  Medications   methylPREDNISolone acetate (DEPO-MEDROL) injection 40 mg    Orders Placed This Encounter  Procedures   XR C-ARM NO REPORT   Epidural Steroid injection    Follow-up: Return for visit to requesting provider as needed.   Procedures: No procedures performed  Lumbar Epidural Steroid Injection - Interlaminar Approach with Fluoroscopic Guidance  Patient: Alyssa Fisher      Date of Birth: September 06, 1965 MRN: [id number] PCP: Jeanie Sewer, NP      Visit Date: 06/19/2022   Universal Protocol:     Consent Given By: the patient  Position: PRONE  Additional Comments: Vital signs were monitored before and after the procedure. Patient was prepped and draped in the usual sterile fashion. The correct patient, procedure, and site was verified.   Injection Procedure Details:   Procedure diagnoses: Lumbar radiculopathy [M54.16]   Meds Administered:  Meds ordered this encounter   Medications   methylPREDNISolone acetate (DEPO-MEDROL) injection 40 mg     Laterality: Left  Location/Site:  L4-5  Needle: 3.5 in., 20 ga. Tuohy  Needle Placement: Paramedian epidural  Findings:   -Comments: Excellent flow of contrast into the epidural space.  Procedure Details: Using a paramedian approach from the side mentioned above, the region overlying the inferior lamina was localized under fluoroscopic visualization and the soft tissues overlying this structure were infiltrated with 4 ml. of 1% Lidocaine without Epinephrine. The Tuohy needle was inserted into the epidural space using a paramedian approach.   The epidural space was localized using loss of resistance along with counter oblique bi-planar fluoroscopic views.  After negative aspirate for air, blood, and CSF, a 2 ml. volume of Isovue-250 was injected into the epidural space and the flow of contrast was observed. Radiographs were obtained for documentation purposes.    The injectate was administered into the level noted above.   Additional Comments:  The patient tolerated the procedure well Dressing: 2 x 2 sterile gauze and Band-Aid    Post-procedure details: Patient was observed during the procedure. Post-procedure instructions were reviewed.  Patient left the clinic in stable condition.   Clinical History: MRI LUMBAR SPINE WITHOUT CONTRAST   TECHNIQUE: Multiplanar, multisequence MR imaging of the lumbar spine was performed. No intravenous contrast was administered.   COMPARISON:  None Available.   FINDINGS: Segmentation:  Standard.   Alignment:  Trace retrolisthesis of L2 over L3.   Vertebrae:  No fracture, evidence of discitis, or bone lesion.   Conus medullaris  and cauda equina: Conus extends to the L1-2 level. Conus and cauda equina appear normal.   Paraspinal and other soft tissues: Negative.   Disc levels:   T12-L1: Small left central disc protrusion causing minimal indentation on  the thecal sac. No spinal canal or neural foraminal stenosis.   L1-2: Small central disc protrusion causing small indentation of the thecal sac without significant spinal canal or neural foraminal stenosis.   L2-3: Minimal disc bulge. No spinal canal or neural foraminal stenosis.   L3-4: No spinal canal or neural foraminal stenosis.   L4-5: Shallow disc bulge and facet hypertrophy without significant spinal canal or neural foraminal stenosis.   L5-S1: Small central annular tear and facet hypertrophy. No significant spinal canal or neural foraminal stenosis.   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 No high-grade spinal canal or neural foraminal stenosis at any level.     Electronically Signed   By: Pedro Earls M.D.   On: 01/31/2022 13:16     Objective:  VS:  HT:    WT:   BMI:     BP:105/73  HR:74bpm  TEMP: ( )  RESP:  Physical Exam Vitals and nursing note reviewed.  Constitutional:      General: She is not in acute distress.    Appearance: Normal appearance. She is not ill-appearing.  HENT:     Head: Normocephalic and atraumatic.     Right Ear: External ear normal.     Left Ear: External ear normal.  Eyes:     Extraocular Movements: Extraocular movements intact.  Cardiovascular:     Rate and Rhythm: Normal rate.     Pulses: Normal pulses.  Pulmonary:     Effort: Pulmonary effort is normal. No respiratory distress.  Abdominal:     General: There is no distension.     Palpations: Abdomen is soft.  Musculoskeletal:        General: Tenderness present.     Cervical back: Neck supple.     Right lower leg: No edema.     Left lower leg: No edema.     Comments: Patient has good distal strength with no pain over the greater trochanters.  No clonus or focal weakness.  Skin:    Findings: No erythema, lesion or rash.  Neurological:     General: No focal deficit present.     Mental  Status: She is alert and oriented to person, place, and time.     Sensory: No sensory deficit.     Motor: No weakness or abnormal muscle tone.     Coordination: Coordination normal.  Psychiatric:        Mood and Affect: Mood normal.        Behavior: Behavior normal.      Imaging: No results found.

## 2022-07-02 NOTE — Procedures (Signed)
Lumbar Epidural Steroid Injection - Interlaminar Approach with Fluoroscopic Guidance  Patient: Alyssa Fisher      Date of Birth: 1965-11-21 MRN: [id number] PCP: Jeanie Sewer, NP      Visit Date: 06/19/2022   Universal Protocol:     Consent Given By: the patient  Position: PRONE  Additional Comments: Vital signs were monitored before and after the procedure. Patient was prepped and draped in the usual sterile fashion. The correct patient, procedure, and site was verified.   Injection Procedure Details:   Procedure diagnoses: Lumbar radiculopathy [M54.16]   Meds Administered:  Meds ordered this encounter  Medications   methylPREDNISolone acetate (DEPO-MEDROL) injection 40 mg     Laterality: Left  Location/Site:  L4-5  Needle: 3.5 in., 20 ga. Tuohy  Needle Placement: Paramedian epidural  Findings:   -Comments: Excellent flow of contrast into the epidural space.  Procedure Details: Using a paramedian approach from the side mentioned above, the region overlying the inferior lamina was localized under fluoroscopic visualization and the soft tissues overlying this structure were infiltrated with 4 ml. of 1% Lidocaine without Epinephrine. The Tuohy needle was inserted into the epidural space using a paramedian approach.   The epidural space was localized using loss of resistance along with counter oblique bi-planar fluoroscopic views.  After negative aspirate for air, blood, and CSF, a 2 ml. volume of Isovue-250 was injected into the epidural space and the flow of contrast was observed. Radiographs were obtained for documentation purposes.    The injectate was administered into the level noted above.   Additional Comments:  The patient tolerated the procedure well Dressing: 2 x 2 sterile gauze and Band-Aid    Post-procedure details: Patient was observed during the procedure. Post-procedure instructions were reviewed.  Patient left the clinic in stable condition.

## 2022-07-06 ENCOUNTER — Telehealth: Payer: Self-pay | Admitting: Physical Medicine and Rehabilitation

## 2022-07-06 NOTE — Telephone Encounter (Signed)
Pt called to call appt with Dr Ernestina Patches Pt asked for a call back to confirm cancellation. Pt phone number is [phone].

## 2022-07-06 NOTE — Telephone Encounter (Signed)
Spoke with patient and she stated she has no pain and wanted to cancel appointment. Appointment for 07/10/22 cancelled

## 2022-07-10 ENCOUNTER — Ambulatory Visit: Payer: Medicare Other | Admitting: Physical Medicine and Rehabilitation

## 2022-07-17 ENCOUNTER — Other Ambulatory Visit: Payer: Self-pay | Admitting: Family

## 2022-07-17 DIAGNOSIS — E669 Obesity, unspecified: Secondary | ICD-10-CM

## 2022-07-17 DIAGNOSIS — R635 Abnormal weight gain: Secondary | ICD-10-CM

## 2022-07-25 ENCOUNTER — Other Ambulatory Visit: Payer: Self-pay | Admitting: Family

## 2022-07-25 DIAGNOSIS — I10 Essential (primary) hypertension: Secondary | ICD-10-CM

## 2022-07-26 ENCOUNTER — Other Ambulatory Visit: Payer: Self-pay | Admitting: Family

## 2022-08-06 ENCOUNTER — Encounter: Payer: Self-pay | Admitting: Family

## 2022-08-06 DIAGNOSIS — F419 Anxiety disorder, unspecified: Secondary | ICD-10-CM

## 2022-08-07 MED ORDER — ESCITALOPRAM OXALATE 20 MG PO TABS: 20.0000 mg | ORAL_TABLET | Freq: Every day | ORAL | 5 refills | Status: DC

## 2022-08-08 MED ORDER — ESCITALOPRAM OXALATE 10 MG PO TABS: 20.0000 mg | ORAL_TABLET | Freq: Every day | ORAL | 0 refills | Status: DC

## 2022-08-18 ENCOUNTER — Other Ambulatory Visit: Payer: Self-pay | Admitting: Family

## 2022-08-31 ENCOUNTER — Other Ambulatory Visit: Payer: Self-pay | Admitting: Family

## 2022-08-31 DIAGNOSIS — F419 Anxiety disorder, unspecified: Secondary | ICD-10-CM

## 2022-09-14 ENCOUNTER — Other Ambulatory Visit: Payer: Self-pay

## 2022-09-14 ENCOUNTER — Encounter: Payer: Self-pay | Admitting: Family

## 2022-09-14 DIAGNOSIS — R635 Abnormal weight gain: Secondary | ICD-10-CM

## 2022-09-14 DIAGNOSIS — E669 Obesity, unspecified: Secondary | ICD-10-CM

## 2022-09-14 MED ORDER — OZEMPIC (0.25 OR 0.5 MG/DOSE) 2 MG/3ML ~~LOC~~ SOPN: 0.5000 mg | PEN_INJECTOR | SUBCUTANEOUS | 0 refills | Status: DC

## 2022-11-06 ENCOUNTER — Other Ambulatory Visit: Payer: Self-pay | Admitting: Family Medicine

## 2022-11-06 DIAGNOSIS — R635 Abnormal weight gain: Secondary | ICD-10-CM

## 2022-11-06 DIAGNOSIS — E669 Obesity, unspecified: Secondary | ICD-10-CM

## 2022-12-06 ENCOUNTER — Ambulatory Visit (INDEPENDENT_AMBULATORY_CARE_PROVIDER_SITE_OTHER): Payer: 59

## 2022-12-06 ENCOUNTER — Ambulatory Visit (INDEPENDENT_AMBULATORY_CARE_PROVIDER_SITE_OTHER): Payer: 59 | Admitting: Orthopaedic Surgery

## 2022-12-06 DIAGNOSIS — M19071 Primary osteoarthritis, right ankle and foot: Secondary | ICD-10-CM | POA: Diagnosis not present

## 2022-12-06 DIAGNOSIS — M25571 Pain in right ankle and joints of right foot: Secondary | ICD-10-CM | POA: Diagnosis not present

## 2022-12-06 MED ORDER — LIDOCAINE HCL 1 % IJ SOLN
4.0000 mL | INTRAMUSCULAR | Status: AC | PRN
  Administered 2022-12-06: 4 mL

## 2022-12-06 MED ORDER — TRIAMCINOLONE ACETONIDE 40 MG/ML IJ SUSP
80.0000 mg | INTRAMUSCULAR | Status: AC | PRN
  Administered 2022-12-06: 80 mg via INTRA_ARTICULAR

## 2022-12-06 NOTE — Progress Notes (Signed)
Chief Complaint: Left hip lower back pain     History of Present Illness:   06/02/2022: Presents today with right ankle pain.  She does have a history of being treated for her right ankle fracture with open reduction internal fixation by Dr. Doran Durand in 2020.  Today's visit she is experiencing pain predominantly in the joint.  This is bothering her with most activities like walking.  Alyssa Fisher is a 57 y.o. female presents with over 10 years of left-sided back pain with some radiation down the thigh.  She has previously taken tramadol and Mobic for this which does help her.  She has undergone 2 rounds of physical therapy for several months with minimal relief.  She has not had any injections.  She did have a fall down the steps in 2011 at which time the pain began on the left side.  She describes it as a pinching feeling.  She is having a hard time laying directly on the side.  She is very active with her children and grandchildren.  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    Surgical History:   None  PMH/PSH/Family History/Social History/Meds/Allergies:    Past Medical History:  Diagnosis Date  . Anxiety   . Broken ankle   . Chronic left-sided lumbar radiculopathy 01/05/2022  . Contraceptive management 11/21/2012  . Depression   . Dizziness - light-headed/fainting 02/20/2011  . Dysphagia 10/23/2012   due to thyroid issues  . Dysphagia 03/01/2011  . Fatigue/Loss of Sleep 02/20/2011  . FHx: allergies 02/20/2011   Ask patient to clarify. Only had "allergies" noted. Need more detail per medical history form dated 01/18/11.  . Generalized headaches 02/20/2011  . GERD (gastroesophageal reflux disease)   . History of tonsillectomy   . Hoarseness/sore throat 02/20/2011  . Hypertension   . Miscarriage    One - date not on medical form.  . Mitral valve prolapse 10/23/2012   no meds  . Night sweats 02/20/2011  . Onychomycosis  12/16/2013  . Rash/bruises easily 02/20/2011  . 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 Rosacea   . Runny nose 02/20/2011  . Seasonal allergies    uses albuterol inhaler as needed for allergies  . Sinus problem 02/20/2011  . Thyroid goiter 10/23/2012   multinodular- has been followed x 75yrs  . Wears glasses/contacts 02/20/2011   Past Surgical History:  Procedure Laterality Date  . ABDOMINAL HYSTERECTOMY    . ANTERIOR AND POSTERIOR REPAIR N/A 02/21/2017   Procedure: ANTERIOR (CYSTOCELE) AND POSTERIOR REPAIR (RECTOCELE);  Surgeon: Cheri Fowler, MD;  Location: [facility];  Service: Gynecology;  Laterality: N/A;  . BILATERAL SALPINGECTOMY N/A 02/21/2017   Procedure: BILATERAL SALPINGECTOMY;  Surgeon: Cheri Fowler, MD;  Location: [facility];  Service: Gynecology;  Laterality: N/A;  . BLADDER SUSPENSION N/A 02/21/2017   Procedure: Solyx Sling;  Surgeon: Cheri Fowler, MD;  Location: [facility];  Service: Gynecology;  Laterality: N/A;  . broken ankle Right    Pins/rod inserted-retained  . COLONOSCOPY    . DILATION AND CURETTAGE OF UTERUS     MAB  . FRACTURE SURGERY    . ORIF ANKLE FRACTURE Right 12/19/2018   Procedure: Removal of hardware and open reduction internal fixation of right ankle bimalleolar fracture;  Surgeon:  Wylene Simmer, MD;  Location: [place];  Service: Orthopedics;  Laterality: Right;  2min ok to open room per Tammy at [facility] Day  . THYROIDECTOMY N/A 10/29/2012   Procedure: THYROIDECTOMY;  Surgeon: Earnstine Regal, MD;  Location: [facility];  Service: General;  Laterality: N/A;  Total Thyroidectomy  . TONSILLECTOMY    . VAGINAL HYSTERECTOMY Bilateral 02/21/2017   Procedure: HYSTERECTOMY VAGINAL;  Surgeon: Cheri Fowler, MD;  Location: [facility];  Service: Gynecology;  Laterality: Bilateral;  . WISDOM TOOTH EXTRACTION     Social History   Socioeconomic History  . Marital status: Legally Separated    Spouse name: Not on file  . Number of  children: 5  . Years of education: Not on file  . Highest education level: Not on file  Occupational History  . Not on file  Tobacco Use  . Smoking status: Never  . Smokeless tobacco: Never  Vaping Use  . Vaping Use: Never used  Substance and Sexual Activity  . Alcohol use: Not Currently    Comment: rare -social  . Drug use: No  . Sexual activity: Not Currently    Birth control/protection: Abstinence, Condom, Post-menopausal  Other Topics Concern  . Not on file  Social History Narrative  . Not on file   Social Determinants of Health   Financial Resource Strain: Not on file  Food Insecurity: Not on file  Transportation Needs: Not on file  Physical Activity: Inactive (05/19/2022)   Exercise Vital Sign   . Days of Exercise per Week: 0 days   . Minutes of Exercise per Session: 0 min  Stress: Stress Concern Present (05/19/2022)   [place]   . Feeling of Stress : To some extent  Social Connections: Moderately Integrated (05/19/2022)   Social Connection and Isolation Panel [NHANES]   . Frequency of Communication with Friends and Family: More than three times a week   . Frequency of Social Gatherings with Friends and Family: More than three times a week   . Attends Religious Services: More than 4 times per year   . Active Member of Clubs or Organizations: No   . Attends Archivist Meetings: Never   . Marital Status: Married   Family History  Problem Relation Age of Onset  . Arthritis Mother   . Cancer Mother   . Heart disease Mother   . Hypertension Mother   . Kidney disease Mother   . Obesity Mother   . Heart disease Father   . Alcohol abuse Father   . Vision loss Father   . Colon polyps Maternal Grandmother    Allergies  Allergen Reactions  . Augmentin [Amoxicillin-Pot Clavulanate] Diarrhea  . Codeine     Patient stated "possible" due to mother and grandparents being allergic as well. Per  medical history form dated 01/18/11. Prefers not to have.   Current Outpatient Medications  Medication Sig Dispense Refill  . ALPRAZolam (XANAX) 0.25 MG tablet Take 1 tablet (0.25 mg total) by mouth daily as needed for anxiety. 30 tablet 2  . cetirizine (ZYRTEC) 10 MG tablet Take 10 mg by mouth daily.    Marland Kitchen diltiazem (DILACOR XR) 120 MG 24 hr capsule Take 1 capsule (120 mg total) by mouth daily. 90 capsule 1  . escitalopram (LEXAPRO) 20 MG tablet Take 20 mg by mouth daily.    Marland Kitchen levothyroxine (SYNTHROID) 75 MCG tablet TAKE 2 TABLETS (150 MCG TOTAL) BY MOUTH DAILY BEFORE BREAKFAST.  90 tablet 1  . meloxicam (MOBIC) 7.5 MG tablet Take 1 tablet (7.5 mg total) by mouth in the morning and at bedtime. 60 tablet 2  . montelukast (SINGULAIR) 10 MG tablet TAKE 1 TABLET BY MOUTH EVERYDAY AT BEDTIME 60 tablet 0  . omeprazole (PRILOSEC) 20 MG capsule omeprazole 20 mg capsule,delayed release    . Semaglutide,0.25 or 0.5MG /DOS, (OZEMPIC, 0.25 OR 0.5 MG/DOSE,) 2 MG/3ML SOPN Inject 0.25 mg into the skin once a week. 3 mL 0  . zolpidem (AMBIEN) 5 MG tablet Take 1 tablet (5 mg total) by mouth at bedtime as needed for sleep. 30 tablet 5   No current facility-administered medications for this visit.   No results found.  Review of Systems:   A ROS was performed including pertinent positives and negatives as documented in the HPI.  Physical Exam :   Constitutional: NAD and appears stated age Neurological: Alert and oriented Psych: Appropriate affect and cooperative Last menstrual period 10/12/2012.   Comprehensive Musculoskeletal Exam:    Worsening pain with straight leg raise.  No radiation or weakness in the left lower leg.  Reflexes symmetric and equal.  Remainder of neurosensory exam is intact  Tenderness palpation about the right ankle joint this is centered deep and essentially about the joint.  No tenderness about her hardware medially or laterally.  Imaging:    X-ray 4 views lumbar spine There is  a scoliotic deformity involving the lumbar spine.  X-ray right ankle 3 views: Displaced open reduction internal fixation with some mild medial joint space narrowing consistent with posttraumatic osteoarthritis I personally reviewed and interpreted the radiographs.   Assessment:   57 y.o. 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  I recommend beginning with an initial ultrasound-guided injection of the right ankle for both therapeutic and diagnostic purposes.  Will plan to proceed with this and I will see her back following to discuss results Plan :    -Right ankle therapeutic and diagnostic ultrasound-guided injection performed on for several send    Procedure Note  Patient: Alyssa Fisher             Date of Birth: 04-28-1966           MRN: [id number]             Visit Date: 12/06/2022  Procedures: Visit Diagnoses:  1. Pain in right ankle and joints of right foot     Medium Joint Inj: R ankle on 12/06/2022 4:54 PM Indications: pain Details: 22 G 1.5 in needle, ultrasound-guided anterior approach Medications: 4 mL lidocaine 1 %; 80 mg triamcinolone acetonide 40 MG/ML Outcome: tolerated well, no immediate complications Immediately prior to procedure a time out was called to verify the correct patient, procedure, equipment, support staff and site/side marked as required. Patient was prepped and draped in the usual sterile fashion.        I personally saw and evaluated the patient, and participated in the management and treatment plan.  Vanetta Mulders, MD Attending Physician, Orthopedic Surgery  This document was dictated using Dragon voice recognition software. A reasonable attempt at proof reading has been made to minimize errors.

## 2022-12-12 ENCOUNTER — Encounter: Payer: Self-pay | Admitting: Physical Medicine and Rehabilitation

## 2022-12-12 ENCOUNTER — Ambulatory Visit (INDEPENDENT_AMBULATORY_CARE_PROVIDER_SITE_OTHER): Payer: 59 | Admitting: Physical Medicine and Rehabilitation

## 2022-12-12 DIAGNOSIS — M47816 Spondylosis without myelopathy or radiculopathy, lumbar region: Secondary | ICD-10-CM | POA: Diagnosis not present

## 2022-12-12 DIAGNOSIS — M47819 Spondylosis without myelopathy or radiculopathy, site unspecified: Secondary | ICD-10-CM

## 2022-12-12 DIAGNOSIS — M545 Low back pain, unspecified: Secondary | ICD-10-CM | POA: Diagnosis not present

## 2022-12-12 DIAGNOSIS — G8929 Other chronic pain: Secondary | ICD-10-CM | POA: Diagnosis not present

## 2022-12-12 NOTE — Progress Notes (Unsigned)
Functional Pain Scale - descriptive words and definitions  Distressing (6)    Pain is present/unable to complete most ADLs limited by pain/sleep is difficult and active distraction is only marginal. Moderate range order  Average Pain 4 but can vary upon activity  Lower back pain mainly on the left side. Has numbness in left leg

## 2022-12-12 NOTE — Progress Notes (Unsigned)
Alyssa DikeDana L Petraglia - 57 y.o. female MRN [id number]  Date of birth: 06/26/1966  Office Visit Note: Visit Date: 12/12/2022 PCP: Dulce SellarHudnell, Stephanie, NP Referred by: Dulce SellarHudnell, Stephanie, NP  Subjective: Chief Complaint  Patient presents with   Lower Back - Pain   HPI: Alyssa Fisher is a 57 y.o. female who comes in today per the request of Dr. Huel CoteSteven Bokshan for evaluation of chronic, worsening and severe left sided lower back pain, intermittent radiation to left hip. Pain ongoing for several years, started after fall in 2011. Pain worsens with prolonged sitting, standing and walking. Household tasks such as mopping, sweeping and lifting objects seems to exacerbate her pain. She describes pain as sore and aching sensation, currently rates as 8 out of 10. Some relief of pain with home exercise regimen, rest and use of medications. No relief with Ibuprofen and Meloxicam. History of formal physical therapy with minimal relief of pain. Lumbar MRI imaging from 2023 exhibits facet hypertrophy at L4-L5 and L5-S1.  No high-grade spinal canal stenosis or foraminal stenosis at any level.  Patient has undergone both left sacroiliac joint injection and left L4-L5 interlaminar epidural steroid injection in our office in 2023, she reports minimal relief of pain with these procedures. History of multiple right ankle fractures, most recent in 2020, repaired by Dr. Toni ArthursJohn Hewitt. Per Dr. Serena CroissantBokshan's recent office visit notes he felt patient was good candidate for possible radiofrequency ablation. Patient suffers with visual loss, uses folding cane to assist with ambulation. Patient denies focal weakness, numbness and tingling. No recent trauma or falls.    Oswestry Disability Index Score 24% 10 to 20 (40%) moderate disability: The patient experiences more pain and difficulty with sitting, lifting and standing. Travel and social life are more difficult, and they may be disabled from work. Personal care, sexual activity and sleeping  are not grossly affected, and the patient can usually be managed by conservative means.  Review of Systems  HENT:         Visual loss, history of retinitis pigmentosa, uses folding cane to assist with ambulation.  Musculoskeletal:  Positive for back pain.  Neurological:  Negative for tingling, sensory change, focal weakness and weakness.  All other systems reviewed and are negative.  Otherwise per HPI.  Assessment & Plan: Visit Diagnoses:    ICD-10-CM   1. Chronic left-sided low back pain without sciatica  M54.50 Ambulatory referral to Physical Medicine Rehab   G89.29     2. Spondylosis without myelopathy or radiculopathy  M47.819 Ambulatory referral to Physical Medicine Rehab    3. Facet hypertrophy of lumbar region  M47.816 Ambulatory referral to Physical Medicine Rehab       Plan: Findings:  Chronic, worsening and severe left-sided lower back pain, intermittent radiation to left hip.  Patient continues to have severe pain despite good conservative therapy such as formal physical therapy, home exercise regimen, rest and use of medications.  Patient's clinical presentation and exam are consistent with facet mediated pain.  Intermittent pain radiating to left lateral hip is more of a facet joint syndrome.  There is facet joint hypertrophy at the levels of L4-L5 and L5-S1 on recent lumbar MRI imaging.  She does have pain with lumbar extension upon exam today.  Neck step is to perform diagnostic and hopefully therapeutic left L4-L5 and L5-S1 facet joint injections under fluoroscopic guidance.  If good relief of pain with facet joint blocks we did discuss the possibility of longer sustained pain relief with radiofrequency ablation  procedure.  Patient has no questions regarding injection procedure at this time.  I did discuss medication management with her today, instructed her to take 500 mg of Tylenol up to 3 times a day as needed.  No red flag symptoms noted upon exam today.    Meds & Orders:  No orders of the defined types were placed in this encounter.   Orders Placed This Encounter  Procedures   Ambulatory referral to Physical Medicine Rehab    Follow-up: Return for Left L4-L5 and L5-S1 facet joint injections.   Procedures: No procedures performed      Clinical History: MRI LUMBAR SPINE WITHOUT CONTRAST   TECHNIQUE: Multiplanar, multisequence MR imaging of the lumbar spine was performed. No intravenous contrast was administered.   COMPARISON:  None Available.   FINDINGS: Segmentation:  Standard.   Alignment:  Trace retrolisthesis of L2 over L3.   Vertebrae:  No fracture, evidence of discitis, or bone lesion.   Conus medullaris and cauda equina: Conus extends to the L1-2 level. Conus and cauda equina appear normal.   Paraspinal and other soft tissues: Negative.   Disc levels:   T12-L1: Small left central disc protrusion causing minimal indentation on the thecal sac. No spinal canal or neural foraminal stenosis.   L1-2: Small central disc protrusion causing small indentation of the thecal sac without significant spinal canal or neural foraminal stenosis.   L2-3: Minimal disc bulge. No spinal canal or neural foraminal stenosis.   L3-4: No spinal canal or neural foraminal stenosis.   L4-5: Shallow disc bulge and facet hypertrophy without significant spinal canal or neural foraminal stenosis.   L5-S1: Small central annular tear and facet hypertrophy. No significant spinal canal or neural foraminal stenosis.   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 No high-grade spinal canal or neural foraminal stenosis at any level.     Electronically Signed   By: Baldemar Lenis M.D.   On: 01/31/2022 13:16   She reports that she has never smoked. She has never used smokeless tobacco. No results for input(s): "HGBA1C", "LABURIC" in the last 8760 hours.  Objective:  VS:  HT:    WT:    BMI:     BP:   HR: bpm  TEMP: ( )  RESP:  Physical Exam Vitals and nursing note reviewed.  HENT:     Head: Normocephalic and atraumatic.     Right Ear: External ear normal.     Left Ear: External ear normal.     Nose: Nose normal.     Mouth/Throat:     Mouth: Mucous membranes are moist.  Eyes:     Extraocular Movements: Extraocular movements intact.  Cardiovascular:     Rate and Rhythm: Normal rate.     Pulses: Normal pulses.  Pulmonary:     Effort: Pulmonary effort is normal.  Abdominal:     General: Abdomen is flat. There is no distension.  Musculoskeletal:        General: Tenderness present.     Cervical back: Normal range of motion.     Comments: Patient rises from seated position to standing without difficulty. Concordant low back pain with facet loading, lumbar spine extension and rotation. 5/5 strength noted with bilateral hip flexion, knee flexion/extension, ankle dorsiflexion/plantarflexion and EHL. No clonus noted bilaterally. No pain upon palpation of greater trochanters. No pain with internal/external rotation of bilateral hips. Sensation intact bilaterally. Negative slump test bilaterally. Ambulates with  cane, gait slow.   Skin:    General: Skin is warm and dry.     Capillary Refill: Capillary refill takes less than 2 seconds.  Neurological:     General: No focal deficit present.     Mental Status: She is alert and oriented to person, place, and time.  Psychiatric:        Mood and Affect: Mood normal.        Behavior: Behavior normal.     Ortho Exam  Imaging: No results found.  Past Medical/Family/Surgical/Social History: Medications & Allergies reviewed per EMR, new medications updated. 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   GERD (gastroesophageal reflux disease) 11/21/2012   Mitral valve prolapse 02/20/2011   Multiple thyroid nodules 02/20/2011   Retinitis pigmentosa 02/20/2011   Arthritis 02/20/2011   Family history of breast cancer 02/20/2011   Past Medical History:  Diagnosis Date   Anxiety    Broken ankle    Chronic left-sided lumbar radiculopathy 01/05/2022   Contraceptive management 11/21/2012   Depression    Dizziness - light-headed/fainting 02/20/2011   Dysphagia 10/23/2012   due to thyroid issues   Dysphagia 03/01/2011   Fatigue/Loss of Sleep 02/20/2011   FHx: allergies 02/20/2011   Ask patient to clarify. Only had "allergies" noted. Need more detail per medical history form dated 01/18/11.   Generalized headaches 02/20/2011   GERD (gastroesophageal reflux disease)    History of tonsillectomy    Hoarseness/sore throat 02/20/2011   Hypertension    Miscarriage    One - date not on medical form.   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- has been followed x 24yrs   Wears glasses/contacts 02/20/2011   Family History  Problem Relation Age of Onset   Arthritis Mother    Cancer Mother    Heart disease Mother    Hypertension Mother    Kidney disease Mother    Obesity Mother    Heart disease Father    Alcohol abuse Father    Vision loss Father    Colon polyps Maternal Grandmother    Past Surgical History:  Procedure Laterality Date   ABDOMINAL HYSTERECTOMY     ANTERIOR AND POSTERIOR REPAIR N/A  02/21/2017   Procedure: ANTERIOR (CYSTOCELE) AND POSTERIOR REPAIR (RECTOCELE);  Surgeon: Lavina Hamman, MD;  Location: [facility];  Service: Gynecology;  Laterality: N/A;   BILATERAL SALPINGECTOMY N/A 02/21/2017   Procedure: BILATERAL SALPINGECTOMY;  Surgeon: Lavina Hamman, MD;  Location: [facility];  Service: Gynecology;  Laterality: N/A;   BLADDER SUSPENSION N/A 02/21/2017   Procedure: Solyx Sling;  Surgeon: Lavina Hamman, MD;  Location: [facility];  Service: Gynecology;  Laterality: N/A;   broken ankle Right    Pins/rod inserted-retained   COLONOSCOPY     DILATION AND CURETTAGE  OF UTERUS     MAB   FRACTURE SURGERY     ORIF ANKLE FRACTURE Right 12/19/2018   Procedure: Removal of hardware and open reduction internal fixation of right ankle bimalleolar fracture;  Surgeon: Toni Arthurs, MD;  Location: [facility];  Service: Orthopedics;  Laterality: Right;  ok to open room per Tammy at [facility]   THYROIDECTOMY N/A 10/29/2012   Procedure: THYROIDECTOMY;  Surgeon: Velora Heckler, MD;  Location: [facility];  Service: General;  Laterality: N/A;  Total Thyroidectomy   TONSILLECTOMY     VAGINAL HYSTERECTOMY Bilateral 02/21/2017   Procedure: HYSTERECTOMY VAGINAL;  Surgeon: Lavina Hamman, MD;  Location: [facility];  Service: Gynecology;  Laterality: Bilateral;   WISDOM TOOTH EXTRACTION     Social History   Occupational History   Not on file  Tobacco Use   Smoking status: Never   Smokeless tobacco: Never  Vaping Use   Vaping Use: Never used  Substance and Sexual Activity   Alcohol use: Not Currently    Comment: rare -social   Drug use: No   Sexual activity: Not Currently    Birth control/protection: Abstinence, Condom, Post-menopausal

## 2022-12-13 ENCOUNTER — Encounter: Payer: Self-pay | Admitting: Family

## 2022-12-13 ENCOUNTER — Other Ambulatory Visit: Payer: Self-pay

## 2022-12-13 MED ORDER — OMEPRAZOLE 20 MG PO CPDR: DELAYED_RELEASE_CAPSULE | ORAL | 2 refills | Status: DC

## 2022-12-18 ENCOUNTER — Other Ambulatory Visit: Payer: Self-pay | Admitting: Physical Medicine and Rehabilitation

## 2022-12-18 MED ORDER — DIAZEPAM 5 MG PO TABS: ORAL_TABLET | ORAL | 0 refills | Status: DC

## 2022-12-26 ENCOUNTER — Other Ambulatory Visit: Payer: Self-pay

## 2022-12-26 ENCOUNTER — Ambulatory Visit (INDEPENDENT_AMBULATORY_CARE_PROVIDER_SITE_OTHER): Payer: 59 | Admitting: Physical Medicine and Rehabilitation

## 2022-12-26 VITALS — BP 133/85 | HR 86

## 2022-12-26 DIAGNOSIS — M47816 Spondylosis without myelopathy or radiculopathy, lumbar region: Secondary | ICD-10-CM | POA: Diagnosis not present

## 2022-12-26 MED ORDER — METHYLPREDNISOLONE ACETATE 80 MG/ML IJ SUSP
80.0000 mg | Freq: Once | INTRAMUSCULAR | Status: AC
Start: 2022-12-26 — End: 2022-12-26
  Administered 2022-12-26: 80 mg

## 2022-12-26 NOTE — Progress Notes (Signed)
Functional Pain Scale - descriptive words and definitions  Moderate (4)   Constantly aware of pain, can complete ADLs with modification/sleep marginally affected at times/passive distraction is of no use, but active distraction gives some relief. Moderate range order  Average Pain 5-6   +Driver, -BT, -Dye Allergies.   Left L4-L5 & L5-S1 facet injections

## 2022-12-26 NOTE — Patient Instructions (Signed)

## 2022-12-31 NOTE — Progress Notes (Signed)
AMARRAH MEINHART - 57 y.o. female MRN [id number]  Date of birth: [birth year]  Office Visit Note: Visit Date: 12/26/2022 PCP: Dulce Sellar, NP Referred by: Dulce Sellar, NP  Subjective: Chief Complaint  Patient presents with   Lower Back - Pain   HPI:  TAMYAH CUTBIRTH is a 57 y.o. female who comes in today at the request of Ellin Goodie, FNP and Dr. Huel Cote for planned Left  L4-5 and L5-S1 Lumbar facet/medial branch block with fluoroscopic guidance.  The patient has failed conservative care including home exercise, medications, time and activity modification.  This injection will be diagnostic and hopefully therapeutic.  Please see requesting physician notes for further details and justification.  Exam has shown concordant pain with facet joint loading.   ROS Otherwise per HPI.  Assessment & Plan: Visit Diagnoses:    ICD-10-CM   1. Spondylosis without myelopathy or radiculopathy, lumbar region  M47.816 XR C-ARM NO REPORT    Facet Injection    methylPREDNISolone acetate (DEPO-MEDROL) injection 80 mg      Plan: No additional findings.   Meds & Orders:  Meds ordered this encounter  Medications   methylPREDNISolone acetate (DEPO-MEDROL) injection 80 mg    Orders Placed This Encounter  Procedures   Facet Injection   XR C-ARM NO REPORT    Follow-up: Return for visit to requesting provider as needed.   Procedures: No procedures performed  Lumbar Facet Joint Intra-Articular Injection(s) with Fluoroscopic Guidance  Patient: GERI HEPLER      Date of Birth: 1965-11-04 MRN: [id number] PCP: Dulce Sellar, NP      Visit Date: 12/26/2022   Universal Protocol:    Date/Time: 12/26/2022  Consent Given By: the patient  Position: PRONE   Additional Comments: Vital signs were monitored before and after the procedure. Patient was prepped and draped in the usual sterile fashion. The correct patient, procedure, and site was verified.   Injection Procedure  Details:  Procedure Site One Meds Administered:  Meds ordered this encounter  Medications   methylPREDNISolone acetate (DEPO-MEDROL) injection 80 mg     Laterality: Left  Location/Site:  L4-L5 L5-S1  Needle size: 22 guage  Needle type: Spinal  Needle Placement: Articular  Findings:  -Comments: Excellent flow of contrast producing a partial arthrogram.  Procedure Details: The fluoroscope beam is vertically oriented in AP, and the inferior recess is visualized beneath the lower pole of the inferior apophyseal process, which represents the target point for needle insertion. When direct visualization is difficult the target point is located at the medial projection of the vertebral pedicle. The region overlying each aforementioned target is locally anesthetized with a 1 to 2 ml. volume of 1% Lidocaine without Epinephrine.   The spinal needle was inserted into each of the above mentioned facet joints using biplanar fluoroscopic guidance. A 0.25 to 0.5 ml. volume of Isovue-250 was injected and a partial facet joint arthrogram was obtained. A single spot film was obtained of the resulting arthrogram.    One to 1.25 ml of the steroid/anesthetic solution was then injected into each of the facet joints noted above.   Additional Comments:  No complications occurred Dressing: 2 x 2 sterile gauze and Band-Aid    Post-procedure details: Patient was observed during the procedure. Post-procedure instructions were reviewed.  Patient left the clinic in stable condition.    Clinical History: MRI LUMBAR SPINE WITHOUT CONTRAST   TECHNIQUE: Multiplanar, multisequence MR imaging of the lumbar spine was performed. No intravenous contrast was  administered.   COMPARISON:  None Available.   FINDINGS: Segmentation:  Standard.   Alignment:  Trace retrolisthesis of L2 over L3.   Vertebrae:  No fracture, evidence of discitis, or bone lesion.   Conus medullaris and cauda equina: Conus  extends to the L1-2 level. Conus and cauda equina appear normal.   Paraspinal and other soft tissues: Negative.   Disc levels:   T12-L1: Small left central disc protrusion causing minimal indentation on the thecal sac. No spinal canal or neural foraminal stenosis.   L1-2: Small central disc protrusion causing small indentation of the thecal sac without significant spinal canal or neural foraminal stenosis.   L2-3: Minimal disc bulge. No spinal canal or neural foraminal stenosis.   L3-4: No spinal canal or neural foraminal stenosis.   L4-5: Shallow disc bulge and facet hypertrophy without significant spinal canal or neural foraminal stenosis.   L5-S1: Small central annular tear and facet hypertrophy. No significant spinal canal or neural foraminal stenosis.   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 No high-grade spinal canal or neural foraminal stenosis at any level.     Electronically Signed   By: Baldemar Lenis M.D.   On: 01/31/2022 13:16     Objective:  VS:  HT:    WT:   BMI:     BP:133/85  HR:86bpm  TEMP: ( )  RESP:  Physical Exam Vitals and nursing note reviewed.  Constitutional:      General: She is not in acute distress.    Appearance: Normal appearance. She is not ill-appearing.  HENT:     Head: Normocephalic and atraumatic.     Right Ear: External ear normal.     Left Ear: External ear normal.  Eyes:     Extraocular Movements: Extraocular movements intact.  Cardiovascular:     Rate and Rhythm: Normal rate.     Pulses: Normal pulses.  Pulmonary:     Effort: Pulmonary effort is normal. No respiratory distress.  Abdominal:     General: There is no distension.     Palpations: Abdomen is soft.  Musculoskeletal:        General: Tenderness present.     Cervical back: Neck supple.     Right lower leg: No edema.     Left lower leg: No edema.     Comments: Patient has good  distal strength with no pain over the greater trochanters.  No clonus or focal weakness.  Skin:    Findings: No erythema, lesion or rash.  Neurological:     General: No focal deficit present.     Mental Status: She is alert and oriented to person, place, and time.     Sensory: No sensory deficit.     Motor: No weakness or abnormal muscle tone.     Coordination: Coordination normal.  Psychiatric:        Mood and Affect: Mood normal.        Behavior: Behavior normal.      Imaging: No results found.

## 2022-12-31 NOTE — Procedures (Signed)
Lumbar Facet Joint Intra-Articular Injection(s) with Fluoroscopic Guidance  Patient: Alyssa Fisher      Date of Birth: 01-Dec-1965 MRN: [id number] PCP: Dulce Sellar, NP      Visit Date: 12/26/2022   Universal Protocol:    Date/Time: 12/26/2022  Consent Given By: the patient  Position: PRONE   Additional Comments: Vital signs were monitored before and after the procedure. Patient was prepped and draped in the usual sterile fashion. The correct patient, procedure, and site was verified.   Injection Procedure Details:  Procedure Site One Meds Administered:  Meds ordered this encounter  Medications   methylPREDNISolone acetate (DEPO-MEDROL) injection 80 mg     Laterality: Left  Location/Site:  L4-L5 L5-S1  Needle size: 22 guage  Needle type: Spinal  Needle Placement: Articular  Findings:  -Comments: Excellent flow of contrast producing a partial arthrogram.  Procedure Details: The fluoroscope beam is vertically oriented in AP, and the inferior recess is visualized beneath the lower pole of the inferior apophyseal process, which represents the target point for needle insertion. When direct visualization is difficult the target point is located at the medial projection of the vertebral pedicle. The region overlying each aforementioned target is locally anesthetized with a 1 to 2 ml. volume of 1% Lidocaine without Epinephrine.   The spinal needle was inserted into each of the above mentioned facet joints using biplanar fluoroscopic guidance. A 0.25 to 0.5 ml. volume of Isovue-250 was injected and a partial facet joint arthrogram was obtained. A single spot film was obtained of the resulting arthrogram.    One to 1.25 ml of the steroid/anesthetic solution was then injected into each of the facet joints noted above.   Additional Comments:  No complications occurred Dressing: 2 x 2 sterile gauze and Band-Aid    Post-procedure details: Patient was observed during the  procedure. Post-procedure instructions were reviewed.  Patient left the clinic in stable condition.

## 2023-01-03 DIAGNOSIS — C44619 Basal cell carcinoma of skin of left upper limb, including shoulder: Secondary | ICD-10-CM | POA: Diagnosis not present

## 2023-01-03 DIAGNOSIS — D2271 Melanocytic nevi of right lower limb, including hip: Secondary | ICD-10-CM | POA: Diagnosis not present

## 2023-01-03 DIAGNOSIS — D225 Melanocytic nevi of trunk: Secondary | ICD-10-CM | POA: Diagnosis not present

## 2023-01-03 DIAGNOSIS — Z1283 Encounter for screening for malignant neoplasm of skin: Secondary | ICD-10-CM | POA: Diagnosis not present

## 2023-01-03 DIAGNOSIS — D485 Neoplasm of uncertain behavior of skin: Secondary | ICD-10-CM | POA: Diagnosis not present

## 2023-01-03 DIAGNOSIS — L728 Other follicular cysts of the skin and subcutaneous tissue: Secondary | ICD-10-CM | POA: Diagnosis not present

## 2023-01-03 DIAGNOSIS — L738 Other specified follicular disorders: Secondary | ICD-10-CM | POA: Diagnosis not present

## 2023-01-03 DIAGNOSIS — C44612 Basal cell carcinoma of skin of right upper limb, including shoulder: Secondary | ICD-10-CM | POA: Diagnosis not present

## 2023-01-10 ENCOUNTER — Ambulatory Visit (HOSPITAL_BASED_OUTPATIENT_CLINIC_OR_DEPARTMENT_OTHER): Payer: Medicare Other | Admitting: Orthopaedic Surgery

## 2023-01-25 ENCOUNTER — Other Ambulatory Visit: Payer: Self-pay | Admitting: Family

## 2023-01-25 DIAGNOSIS — I10 Essential (primary) hypertension: Secondary | ICD-10-CM

## 2023-02-07 ENCOUNTER — Ambulatory Visit (HOSPITAL_BASED_OUTPATIENT_CLINIC_OR_DEPARTMENT_OTHER): Payer: 59 | Admitting: Orthopaedic Surgery

## 2023-02-08 ENCOUNTER — Other Ambulatory Visit: Payer: Self-pay | Admitting: Family

## 2023-02-23 ENCOUNTER — Other Ambulatory Visit: Payer: Self-pay | Admitting: Family

## 2023-02-23 DIAGNOSIS — F419 Anxiety disorder, unspecified: Secondary | ICD-10-CM

## 2023-03-06 ENCOUNTER — Other Ambulatory Visit: Payer: Self-pay | Admitting: Family

## 2023-03-12 ENCOUNTER — Other Ambulatory Visit: Payer: Self-pay | Admitting: Family

## 2023-03-12 DIAGNOSIS — R635 Abnormal weight gain: Secondary | ICD-10-CM

## 2023-03-12 DIAGNOSIS — E669 Obesity, unspecified: Secondary | ICD-10-CM

## 2023-04-18 ENCOUNTER — Encounter: Payer: Self-pay | Admitting: Family

## 2023-04-18 ENCOUNTER — Ambulatory Visit (INDEPENDENT_AMBULATORY_CARE_PROVIDER_SITE_OTHER): Payer: 59 | Admitting: Family

## 2023-04-18 VITALS — BP 112/80 | HR 69 | Temp 97.5°F | Wt 174.0 lb

## 2023-04-18 DIAGNOSIS — F419 Anxiety disorder, unspecified: Secondary | ICD-10-CM | POA: Diagnosis not present

## 2023-04-18 DIAGNOSIS — M545 Low back pain, unspecified: Secondary | ICD-10-CM | POA: Diagnosis not present

## 2023-04-18 DIAGNOSIS — E669 Obesity, unspecified: Secondary | ICD-10-CM

## 2023-04-18 DIAGNOSIS — Z6828 Body mass index (BMI) 28.0-28.9, adult: Secondary | ICD-10-CM | POA: Diagnosis not present

## 2023-04-18 DIAGNOSIS — R635 Abnormal weight gain: Secondary | ICD-10-CM

## 2023-04-18 MED ORDER — ALPRAZOLAM 0.25 MG PO TABS
0.2500 mg | ORAL_TABLET | Freq: Every day | ORAL | Status: DC | PRN
Start: 2023-04-18 — End: 2024-07-30

## 2023-04-18 MED ORDER — METHOCARBAMOL 500 MG PO TABS: 500.0000 mg | ORAL_TABLET | Freq: Three times a day (TID) | ORAL | 0 refills | Status: DC | PRN

## 2023-04-18 MED ORDER — METHYLPREDNISOLONE ACETATE 80 MG/ML IJ SUSP
80.0000 mg | Freq: Once | INTRAMUSCULAR | Status: AC
Start: 2023-04-18 — End: 2023-04-18
  Administered 2023-04-18: 80 mg via INTRAMUSCULAR

## 2023-04-18 MED ORDER — SEMAGLUTIDE (1 MG/DOSE) 4 MG/3ML ~~LOC~~ SOPN
1.0000 mg | PEN_INJECTOR | SUBCUTANEOUS | 2 refills | Status: DC
Start: 2023-04-18 — End: 2023-06-21

## 2023-04-18 NOTE — Progress Notes (Signed)
Patient ID: Alyssa Fisher, female    DOB: 03/19/1966, 57 y.o.   MRN: [id number]  Chief Complaint  Patient presents with   Back Pain    Pulled muscle on left side of back while moving a desk up stairs. Rx refill for xanax    HPI: Abnormal weight gain:  pt denies ever trying previous wt loss programs or medications. Reports trying a "Keto-like" diet, with exercise and lost about 20lbs. She is unable to exercise at the gym as her mother's health has been bad and she is her transportation d/t pt unable to drive d/t poor vision. She reports since having her thyroid removed, her weight has been up and down. Started Ozempic, 05/2022, currently on 0.5mg  dose, tolerating well, weight down just 7lbs total.   Anxiety/Depression: Patient complains of anxiety disorder.   She has the following symptoms: none.  Onset of symptoms was approximately years ago, She denies current suicidal and homicidal ideation.  Possible organic causes contributing are: none.  Risk factors: none. Previous treatment includes Lexapro and Xanax and individual therapy.   Pt has had Xanax RX since last fall. Taking recently due to upcoming divorce proceedings. Her bottle states it is expiring this month.  Back pain:  pt has had chronic, intermittent episodes in past and has had to have spinal injections. Has been seen by [facility] and has had SI injections. Today c/o acute pain described as dull, achy, persistent on her left lumbar after lifting and carrying a desk upstairs with her son. She has tried Ibuprofen & Aleve without relief. She states this is muscular pain, not involving her spine, no neuropathies or burning.  Assessment & Plan:  Acute left-sided low back pain without sciatica- musculoskeletal, given steroid injection & sending Robaxin 500-1000mg  tid prn, advised on use & SE. Can also apply Lidocaine patches, OTC, apply ice or heat up to tid.  RTO if sx are not resolving.  -     Methocarbamol; Take 1 tablet (500 mg  total) by mouth every 8 (eight) hours as needed for muscle spasms (For back pain.).  Dispense: 60 tablet; Refill: 0 -     methylPREDNISolone Acetate  Abnormal weight gain Assessment & Plan: chronic taking Ozempic 0.5mg  qweekly since 05/2022 weight down just 7lbs total increasing dose to 1mg  qweek f/u in 3 mos or prn  Orders: -     Semaglutide (1 MG/DOSE); Inject 1 mg as directed once a week.  Dispense: 3 mL; Refill: 2  Obesity (BMI 30-39.9) -     Semaglutide (1 MG/DOSE); Inject 1 mg as directed once a week.  Dispense: 3 mL; Refill: 2  Anxiety Assessment & Plan: chronic, stable taking Lexapro 20mg  daily, Xanax 0.25mg  prn advised pt her Xanax pills are still good past the expiration date this month, safe to finish her bottle f/u in 3 mos or prn  Orders: -     ALPRAZolam; Take 1 tablet (0.25 mg total) by mouth daily as needed for anxiety.    Subjective:    Outpatient Medications Prior to Visit  Medication Sig Dispense Refill   cetirizine (ZYRTEC) 10 MG tablet Take 10 mg by mouth daily.     DILT-XR 120 MG 24 hr capsule TAKE 1 CAPSULE BY MOUTH EVERY DAY 90 capsule 1   escitalopram (LEXAPRO) 10 MG tablet TAKE 2 TABLETS BY MOUTH EVERY DAY 180 tablet 1   levothyroxine (SYNTHROID) 75 MCG tablet TAKE 2 TABLETS BY MOUTH DAILY BEFORE BREAKFAST. 180 tablet 1  Multiple Vitamin (MULTIVITAMIN ADULT PO) Take by mouth.     omeprazole (PRILOSEC) 20 MG capsule TAKE 1 CAPSULE BY MOUTH EVERY DAY 90 capsule 0   Semaglutide,0.25 or 0.5MG /DOS, (OZEMPIC, 0.25 OR 0.5 MG/DOSE,) 2 MG/3ML SOPN INJECT 0.5 MG INTO THE SKIN ONE TIME PER WEEK 3 mL 3   diazepam (VALIUM) 5 MG tablet Take one tablet by mouth with food one hour prior to procedure. May repeat 30 minutes prior if needed. (Patient not taking: Reported on 04/18/2023) 2 tablet 0   montelukast (SINGULAIR) 10 MG tablet TAKE 1 TABLET BY MOUTH EVERYDAY AT BEDTIME (Patient not taking: Reported on 04/18/2023) 90 tablet 1   No facility-administered  medications prior to visit.   Past Medical History:  Diagnosis Date   Anxiety    Broken ankle    Chronic left-sided lumbar radiculopathy 01/05/2022   Contraceptive management 11/21/2012   Depression    Dizziness - light-headed/fainting 02/20/2011   Dysphagia 10/23/2012   due to thyroid issues   Dysphagia 03/01/2011   Fatigue/Loss of Sleep 02/20/2011   FHx: allergies 02/20/2011   Ask patient to clarify. Only had "allergies" noted. Need more detail per medical history form dated 01/18/11.   Generalized headaches 02/20/2011   GERD (gastroesophageal reflux disease)    History of tonsillectomy    Hoarseness/sore throat 02/20/2011   Hypertension    Miscarriage    One - date not on medical form.   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- has been followed x 61yrs   Wears glasses/contacts 02/20/2011   Past Surgical History:  Procedure Laterality Date   ABDOMINAL HYSTERECTOMY     ANTERIOR AND POSTERIOR REPAIR N/A 02/21/2017   Procedure: ANTERIOR (CYSTOCELE) AND POSTERIOR REPAIR (RECTOCELE);  Surgeon: Lavina Hamman, MD;  Location: [facility];  Service: Gynecology;  Laterality: N/A;   BILATERAL SALPINGECTOMY N/A 02/21/2017   Procedure: BILATERAL SALPINGECTOMY;  Surgeon: Lavina Hamman, MD;  Location: [facility];  Service: Gynecology;  Laterality: N/A;   BLADDER SUSPENSION N/A 02/21/2017   Procedure: Solyx Sling;  Surgeon: Lavina Hamman, MD;  Location: [facility];  Service: Gynecology;  Laterality: N/A;   broken ankle Right    Pins/rod inserted-retained   COLONOSCOPY     DILATION AND CURETTAGE OF UTERUS     MAB   FRACTURE SURGERY     ORIF ANKLE FRACTURE Right 12/19/2018    Procedure: Removal of hardware and open reduction internal fixation of right ankle bimalleolar fracture;  Surgeon: Toni Arthurs, MD;  Location: [facility];  Service: Orthopedics;  Laterality: Right;  ok to open room per Tammy at [facility]   THYROIDECTOMY N/A 10/29/2012   Procedure: THYROIDECTOMY;  Surgeon: Velora Heckler, MD;  Location: [facility];  Service: General;  Laterality: N/A;  Total Thyroidectomy   TONSILLECTOMY     VAGINAL HYSTERECTOMY Bilateral 02/21/2017   Procedure: HYSTERECTOMY VAGINAL;  Surgeon: Lavina Hamman, MD;  Location: [facility];  Service: Gynecology;  Laterality: Bilateral;   WISDOM TOOTH EXTRACTION     Allergies  Allergen Reactions   Augmentin [Amoxicillin-Pot Clavulanate] Diarrhea   Codeine     Patient stated "possible" due to  mother and grandparents being allergic as well. Per medical history form dated 01/18/11. Prefers not to have.      Objective:    Physical Exam Vitals and nursing note reviewed.  Constitutional:      Appearance: Normal appearance.  Cardiovascular:     Rate and Rhythm: Normal rate and regular rhythm.  Pulmonary:     Effort: Pulmonary effort is normal.     Breath sounds: Normal breath sounds.  Musculoskeletal:     Thoracic back: Normal.     Lumbar back: Tenderness present. No swelling. Decreased range of motion.  Skin:    General: Skin is warm and dry.  Neurological:     Mental Status: She is alert.  Psychiatric:        Mood and Affect: Mood normal.        Behavior: Behavior normal.    BP 112/80 (BP Location: Right Arm, Patient Position: Sitting, Cuff Size: Large)   Pulse 69   Temp (!) 97.5 F (36.4 C) (Temporal)   Wt 174 lb (78.9 kg)   LMP 10/12/2012   SpO2 98%   BMI 28.96 kg/m  Wt Readings from Last 3 Encounters:  04/18/23 174 lb (78.9 kg)  06/09/22 181 lb (82.1 kg)  05/02/22 181 lb 4 oz (82.2 kg)       Dulce Sellar, NP

## 2023-04-18 NOTE — Assessment & Plan Note (Signed)
chronic taking Ozempic 0.5mg  qweekly since 05/2022 weight down just 7lbs total increasing dose to 1mg  qweek f/u in 3 mos or prn

## 2023-04-18 NOTE — Assessment & Plan Note (Addendum)
chronic, stable taking Lexapro 20mg  daily, Xanax 0.25mg  prn advised pt her Xanax pills are still good past the expiration date this month, safe to finish her bottle f/u in 3 mos or prn

## 2023-04-19 ENCOUNTER — Encounter (INDEPENDENT_AMBULATORY_CARE_PROVIDER_SITE_OTHER): Payer: Self-pay

## 2023-05-09 ENCOUNTER — Telehealth: Payer: Self-pay

## 2023-05-09 NOTE — Telephone Encounter (Signed)
Spoke with patient and she is requesting a repeat injection. Last injection was 12/26/22 and the pain started coming back 6-8 weeks ago. She states she received at least 75% relief. No new falls, accidents or injuries. Please advise

## 2023-05-10 ENCOUNTER — Other Ambulatory Visit: Payer: Self-pay | Admitting: Physical Medicine and Rehabilitation

## 2023-05-10 DIAGNOSIS — G8929 Other chronic pain: Secondary | ICD-10-CM

## 2023-05-10 DIAGNOSIS — M47816 Spondylosis without myelopathy or radiculopathy, lumbar region: Secondary | ICD-10-CM

## 2023-05-10 MED ORDER — DIAZEPAM 5 MG PO TABS: ORAL_TABLET | ORAL | 0 refills | Status: DC

## 2023-05-16 ENCOUNTER — Ambulatory Visit (HOSPITAL_BASED_OUTPATIENT_CLINIC_OR_DEPARTMENT_OTHER): Payer: 59 | Admitting: Orthopaedic Surgery

## 2023-05-18 ENCOUNTER — Telehealth: Payer: Self-pay | Admitting: Physical Medicine and Rehabilitation

## 2023-05-18 NOTE — Telephone Encounter (Signed)
Spoke with patient and scheduled injection for 05/28/23. Patient aware driver needed

## 2023-05-18 NOTE — Telephone Encounter (Signed)
Patient returned call  asked for a call back to schedule an appointment with Dr. Alvester Morin for her back. The number to contact patient is [phone]

## 2023-05-21 ENCOUNTER — Ambulatory Visit (INDEPENDENT_AMBULATORY_CARE_PROVIDER_SITE_OTHER): Payer: 59

## 2023-05-21 VITALS — Wt 170.0 lb

## 2023-05-21 DIAGNOSIS — Z0000 Encounter for general adult medical examination without abnormal findings: Secondary | ICD-10-CM | POA: Diagnosis not present

## 2023-05-21 NOTE — Patient Instructions (Signed)
Alyssa Fisher , Thank you for taking time to come for your Medicare Wellness Visit. I appreciate your ongoing commitment to your health goals. Please review the following plan we discussed and let me know if I can assist you in the future.   Referrals/Orders/Follow-Ups/Clinician Recommendations: continue to exercise and lose weight   This is a list of the screening recommended for you and due dates:  Health Maintenance  Topic Date Due   HIV Screening  Never done   Hepatitis C Screening  Never done   Flu Shot  04/05/2023   COVID-19 Vaccine (5 - 2023-24 season) 05/06/2023   Medicare Annual Wellness Visit  05/20/2024   Mammogram  06/22/2024   Colon Cancer Screening  04/28/2026   DTaP/Tdap/Td vaccine (3 - Td or Tdap) 02/02/2032   Zoster (Shingles) Vaccine  Completed   HPV Vaccine  Aged Out    Advanced directives: (Declined) Advance directive discussed with you today. Even though you declined this today, please call our office should you change your mind, and we can give you the proper paperwork for you to fill out.  Next Medicare Annual Wellness Visit scheduled for next year: Yes

## 2023-05-21 NOTE — Progress Notes (Addendum)
Subjective:   Alyssa Fisher is a 57 y.o. female who presents for Medicare Annual (Subsequent) preventive examination.  Visit Complete: Virtual  I connected with  Alyssa Fisher on 05/21/23 by a audio enabled telemedicine application and verified that I am speaking with the correct person using two identifiers.  Vital Signs: Unable to obtain new vitals due to this being a telehealth visit.   Patient Location: Home  Provider Location: Office/Clinic  I discussed the limitations of evaluation and management by telemedicine. The patient expressed understanding and agreed to proceed.   Cardiac Risk Factors include: advanced age (>4men, >98 women)     Objective:    Today's Vitals   05/21/23 1139  Weight: 170 lb (77.1 kg)   Body mass index is 28.29 kg/m.     05/21/2023   11:42 AM 05/19/2022   10:12 AM 12/19/2018    6:36 AM 12/18/2018   12:08 PM 09/19/2017    9:03 AM 02/21/2017    3:00 PM 02/13/2017    2:16 PM  Advanced Directives  Does Patient Have a Medical Advance Directive? No No No No No No No  Type of Furniture conservator/restorer;Living will       Does patient want to make changes to medical advance directive?  Yes (MAU/Ambulatory/Procedural Areas - Information given)       Would patient like information on creating a medical advance directive? No - Patient declined Yes (MAU/Ambulatory/Procedural Areas - Information given) No - Patient declined No - Patient declined No - Patient declined Yes (ED - Information included in AVS) Yes (MAU/Ambulatory/Procedural Areas - Information given)    Current Medications (verified) Outpatient Encounter Medications as of 05/21/2023  Medication Sig   ALPRAZolam (XANAX) 0.25 MG tablet Take 1 tablet (0.25 mg total) by mouth daily as needed for anxiety.   cetirizine (ZYRTEC) 10 MG tablet Take 10 mg by mouth daily.   diazepam (VALIUM) 5 MG tablet Take one tablet by mouth with food one hour prior to procedure. May repeat 30  minutes prior if needed.   DILT-XR 120 MG 24 hr capsule TAKE 1 CAPSULE BY MOUTH EVERY DAY   escitalopram (LEXAPRO) 10 MG tablet TAKE 2 TABLETS BY MOUTH EVERY DAY   levothyroxine (SYNTHROID) 75 MCG tablet TAKE 2 TABLETS BY MOUTH DAILY BEFORE BREAKFAST.   methocarbamol (ROBAXIN) 500 MG tablet Take 1 tablet (500 mg total) by mouth every 8 (eight) hours as needed for muscle spasms (For back pain.).   Multiple Vitamin (MULTIVITAMIN ADULT PO) Take by mouth.   omeprazole (PRILOSEC) 20 MG capsule TAKE 1 CAPSULE BY MOUTH EVERY DAY   Semaglutide, 1 MG/DOSE, 4 MG/3ML SOPN Inject 1 mg as directed once a week.   No facility-administered encounter medications on file as of 05/21/2023.    Allergies (verified) Augmentin [amoxicillin-pot clavulanate] and Codeine   History: Past Medical History:  Diagnosis Date   Anxiety    Broken ankle    Chronic left-sided lumbar radiculopathy 01/05/2022   Contraceptive management 11/21/2012   Depression    Dizziness - light-headed/fainting 02/20/2011   Dysphagia 10/23/2012   due to thyroid issues   Dysphagia 03/01/2011   Fatigue/Loss of Sleep 02/20/2011   FHx: allergies 02/20/2011   Ask patient to clarify. Only had "allergies" noted. Need more detail per medical history form dated 01/18/11.   Generalized headaches 02/20/2011   GERD (gastroesophageal reflux disease)    History of tonsillectomy    Hoarseness/sore throat 02/20/2011   Hypertension  Miscarriage    One - date not on medical form.   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- has been followed x 24yrs   Wears glasses/contacts 02/20/2011   Past Surgical History:  Procedure  Laterality Date   ABDOMINAL HYSTERECTOMY     ANTERIOR AND POSTERIOR REPAIR N/A 02/21/2017   Procedure: ANTERIOR (CYSTOCELE) AND POSTERIOR REPAIR (RECTOCELE);  Surgeon: Lavina Hamman, MD;  Location: [facility];  Service: Gynecology;  Laterality: N/A;   BILATERAL SALPINGECTOMY N/A 02/21/2017   Procedure: BILATERAL SALPINGECTOMY;  Surgeon: Lavina Hamman, MD;  Location: [facility];  Service: Gynecology;  Laterality: N/A;   BLADDER SUSPENSION N/A 02/21/2017   Procedure: Solyx Sling;  Surgeon: Lavina Hamman, MD;  Location: [facility];  Service: Gynecology;  Laterality: N/A;   broken ankle Right    Pins/rod inserted-retained   COLONOSCOPY     DILATION AND CURETTAGE OF UTERUS     MAB   FRACTURE SURGERY     ORIF ANKLE FRACTURE Right 12/19/2018   Procedure: Removal of hardware and open reduction internal fixation of right ankle bimalleolar fracture;  Surgeon: Toni Arthurs, MD;  Location: [facility];  Service: Orthopedics;  Laterality: Right;  ok to open room per [facility] at [facility]   THYROIDECTOMY N/A 10/29/2012   Procedure: THYROIDECTOMY;  Surgeon: Velora Heckler, MD;  Location: [facility];  Service: General;  Laterality: N/A;  Total Thyroidectomy   TONSILLECTOMY     VAGINAL HYSTERECTOMY Bilateral 02/21/2017   Procedure: HYSTERECTOMY VAGINAL;  Surgeon: Lavina Hamman, MD;  Location: [facility];  Service: Gynecology;  Laterality: Bilateral;   WISDOM TOOTH EXTRACTION     Family History  Problem Relation Age of Onset   Arthritis Mother    Cancer Mother    Heart disease Mother    Hypertension Mother    Kidney disease Mother    Obesity Mother    Heart disease Father    Alcohol abuse Father    Vision loss Father    Colon polyps Maternal Grandmother    Social History   Socioeconomic History   Marital status: Legally Separated    Spouse name: Not on file   Number of children: 5   Years of education: Not on file   Highest education level: Not on file  Occupational History   Not on  file  Tobacco Use   Smoking status: Never   Smokeless tobacco: Never  Vaping Use   Vaping status: Never Used  Substance and Sexual Activity   Alcohol use: Not Currently    Comment: rare -social   Drug use: No   Sexual activity: Not Currently    Birth control/protection: Abstinence, Condom, Post-menopausal  Other Topics Concern   Not on file  Social History Narrative   Not on file   Social Determinants of Health   Financial Resource Strain: Low Risk  (05/21/2023)   Overall Financial Resource Strain (CARDIA)    Difficulty of Paying Living Expenses: Not hard at all  Food Insecurity: No Food Insecurity (05/21/2023)   Hunger Vital Sign    Worried About Running Out of Food in the Last Year: Never true  Ran Out of Food in the Last Year: Never true  Transportation Needs: No Transportation Needs (05/21/2023)   PRAPARE - Administrator, Civil Service (Medical): No    Lack of Transportation (Non-Medical): No  Physical Activity: Insufficiently Active (05/21/2023)   Exercise Vital Sign    Days of Exercise per Week: 3 days    Minutes of Exercise per Session: 30 min  Stress: No Stress Concern Present (05/21/2023)   [employer] of Occupational Health - Occupational Stress Questionnaire    Feeling of Stress : Not at all  Social Connections: Moderately Integrated (05/21/2023)   Social Connection and Isolation Panel [NHANES]    Frequency of Communication with Friends and Family: More than three times a week    Frequency of Social Gatherings with Friends and Family: More than three times a week    Attends Religious Services: More than 4 times per year    Active Member of [employer] or Organizations: Yes    Attends Banker Meetings: 1 to 4 times per year    Marital Status: Divorced    Tobacco Counseling Counseling given: Not Answered   Clinical Intake:  Pre-visit preparation completed: Yes  Pain : No/denies pain     BMI - recorded: 28.29 Nutritional Status:  BMI 25 -29 Overweight Nutritional Risks: None  How often do you need to have someone help you when you read instructions, pamphlets, or other written materials from your doctor or pharmacy?: 1 - Never  Interpreter Needed?: No  Information entered by :: Lanier Ensign, LPN   Activities of Daily Living    05/21/2023   11:41 AM  In your present state of health, do you have any difficulty performing the following activities:  Hearing? 0  Vision? 1  Comment visually impaired  Difficulty concentrating or making decisions? 0  Walking or climbing stairs? 1  Comment at times  Dressing or bathing? 0  Doing errands, shopping? 0  Preparing Food and eating ? N  Using the Toilet? N  In the past six months, have you accidently leaked urine? N  Do you have problems with loss of bowel control? N  Managing your Medications? N  Managing your Finances? N  Housekeeping or managing your Housekeeping? N    Patient Care Team: Dulce Sellar, NP as PCP - General (Family Medicine)  Indicate any recent Medical Services you may have received from other than [facility] providers in the past year (date may be approximate).     Assessment:   This is a routine wellness examination for Alyssa Fisher.  Hearing/Vision screen Hearing Screening - Comments:: Pt denies any hearing issues  Vision Screening - Comments:: [facility]    Goals Addressed             This Visit's Progress    Patient Stated       Continue exercising and lose weight        Depression Screen    05/21/2023   11:45 AM 05/19/2022   10:10 AM 01/05/2022   10:38 AM  PHQ 2/9 Scores  PHQ - 2 Score 0 1 0    Fall Risk    05/19/2022   10:13 AM  Fall Risk   Falls in the past year? 1  Number falls in past yr: 1  Injury with Fall? 1  Comment foot sprain  Risk for fall due to : No Fall Risks;Impaired vision;Impaired mobility  Risk for fall due to: Comment related to sight  concerns  Follow up Falls prevention  discussed    MEDICARE RISK AT HOME: Medicare Risk at Home Any stairs in or around the home?: Yes If so, are there any without handrails?: No Home free of loose throw rugs in walkways, pet beds, electrical cords, etc?: Yes Adequate lighting in your home to reduce risk of falls?: Yes Life alert?: No Use of a cane, walker or w/c?: Yes Grab bars in the bathroom?: No Shower chair or bench in shower?: No Elevated toilet seat or a handicapped toilet?: No  TIMED UP AND GO:  Was the test performed?  No    Cognitive Function:        05/19/2022   10:16 AM  6CIT Screen  What Year? 0 points  What month? 0 points  What time? 0 points  Count back from 20 0 points  Months in reverse 0 points  Repeat phrase 0 points  Total Score 0 points    Immunizations Immunization History  Administered Date(s) Administered   Influenza Split 06/04/2012, 06/09/2013   Influenza, Seasonal, Injecte, Preservative Fre 05/05/2014   Influenza,inj,Quad PF,6+ Mos 05/02/2022   Influenza-Unspecified 05/05/2018   PFIZER(Purple Top)SARS-COV-2 Vaccination 05/28/2020, 12/09/2020   Pfizer Covid-19 Vaccine Bivalent Booster 74yrs & up 07/04/2021, 06/07/2022   Tdap 09/04/2008, 02/01/2022   Zoster Recombinant(Shingrix) 12/16/2021, 02/17/2022    TDAP status: Up to date  Flu Vaccine status: Due, Education has been provided regarding the importance of this vaccine. Advised may receive this vaccine at local pharmacy or Health Dept. Aware to provide a copy of the vaccination record if obtained from local pharmacy or Health Dept. Verbalized acceptance and understanding.    Covid-19 vaccine status: Information provided on how to obtain vaccines.   Qualifies for Shingles Vaccine? Yes   Zostavax completed Yes   Shingrix Completed?: Yes  Screening Tests Health Maintenance  Topic Date Due   HIV Screening  Never done   Hepatitis C Screening  Never done   COVID-19 Vaccine (5 - 2023-24 season) 05/06/2023   INFLUENZA  VACCINE  12/03/2023 (Originally 04/05/2023)   Medicare Annual Wellness (AWV)  05/20/2024   MAMMOGRAM  06/22/2024   Colonoscopy  04/28/2026   DTaP/Tdap/Td (3 - Td or Tdap) 02/02/2032   Zoster Vaccines- Shingrix  Completed   HPV VACCINES  Aged Out    Health Maintenance  Health Maintenance Due  Topic Date Due   HIV Screening  Never done   Hepatitis C Screening  Never done   COVID-19 Vaccine (5 - 2023-24 season) 05/06/2023    Colorectal cancer screening: Type of screening: Colonoscopy. Completed 04/28/26. Repeat every 10 years  Mammogram status: Completed 06/22/22. Repeat every year     Additional Screening:  Hepatitis C Screening: does qualify;  Vision Screening: Recommended annual ophthalmology exams for early detection of glaucoma and other disorders of the eye. Is the patient up to date with their annual eye exam?  Yes  Who is the provider or what is the name of the office in which the patient attends annual eye exams? [facility]  If pt is not established with a provider, would they like to be referred to a provider to establish care? No .   Dental Screening: Recommended annual dental exams for proper oral hygiene   Community Resource Referral / Chronic Care Management: CRR required this visit?  No   CCM required this visit?  No     Plan:     I have personally reviewed and noted the following in the patient's  chart:   Medical and social history Use of alcohol, tobacco or illicit drugs  Current medications and supplements including opioid prescriptions. Patient is not currently taking opioid prescriptions. Functional ability and status Nutritional status Physical activity Advanced directives List of other physicians Hospitalizations, surgeries, and ER visits in previous 12 months Vitals Screenings to include cognitive, depression, and falls Referrals and appointments  In addition, I have reviewed and discussed with patient certain preventive protocols,  quality metrics, and best practice recommendations. A written personalized care plan for preventive services as well as general preventive health recommendations were provided to patient.     Marzella Schlein, LPN   [id number]   After Visit Summary: (MyChart) Due to this being a telephonic visit, the after visit summary with patients personalized plan was offered to patient via MyChart   Nurse Notes: none

## 2023-05-23 NOTE — Progress Notes (Signed)
Subjective:   Alyssa Fisher is a 57 y.o. female who presents for Medicare Annual (Subsequent) preventive examination.  Visit Complete: Virtual  I connected with  Alyssa Fisher on 05/23/23 by a audio enabled telemedicine application and verified that I am speaking with the correct person using two identifiers.  Vital Signs: Unable to obtain new vitals due to this being a telehealth visit.   Patient Location: Home  Provider Location: Office/Clinic  I discussed the limitations of evaluation and management by telemedicine. The patient expressed understanding and agreed to proceed.   Cardiac Risk Factors include: advanced age (>14men, >52 women)     Objective:    Today's Vitals   05/21/23 1139  Weight: 170 lb (77.1 kg)   Body mass index is 28.29 kg/m.     05/21/2023   11:42 AM 05/19/2022   10:12 AM 12/19/2018    6:36 AM 12/18/2018   12:08 PM 09/19/2017    9:03 AM 02/21/2017    3:00 PM 02/13/2017    2:16 PM  Advanced Directives  Does Patient Have a Medical Advance Directive? No No No No No No No  Type of Furniture conservator/restorer;Living will       Does patient want to make changes to medical advance directive?  Yes (MAU/Ambulatory/Procedural Areas - Information given)       Would patient like information on creating a medical advance directive? No - Patient declined Yes (MAU/Ambulatory/Procedural Areas - Information given) No - Patient declined No - Patient declined No - Patient declined Yes (ED - Information included in AVS) Yes (MAU/Ambulatory/Procedural Areas - Information given)    Current Medications (verified) Outpatient Encounter Medications as of 05/21/2023  Medication Sig   ALPRAZolam (XANAX) 0.25 MG tablet Take 1 tablet (0.25 mg total) by mouth daily as needed for anxiety.   cetirizine (ZYRTEC) 10 MG tablet Take 10 mg by mouth daily.   diazepam (VALIUM) 5 MG tablet Take one tablet by mouth with food one hour prior to procedure. May repeat 30  minutes prior if needed.   DILT-XR 120 MG 24 hr capsule TAKE 1 CAPSULE BY MOUTH EVERY DAY   escitalopram (LEXAPRO) 10 MG tablet TAKE 2 TABLETS BY MOUTH EVERY DAY   levothyroxine (SYNTHROID) 75 MCG tablet TAKE 2 TABLETS BY MOUTH DAILY BEFORE BREAKFAST.   methocarbamol (ROBAXIN) 500 MG tablet Take 1 tablet (500 mg total) by mouth every 8 (eight) hours as needed for muscle spasms (For back pain.).   Multiple Vitamin (MULTIVITAMIN ADULT PO) Take by mouth.   omeprazole (PRILOSEC) 20 MG capsule TAKE 1 CAPSULE BY MOUTH EVERY DAY   Semaglutide, 1 MG/DOSE, 4 MG/3ML SOPN Inject 1 mg as directed once a week.   No facility-administered encounter medications on file as of 05/21/2023.    Allergies (verified) Augmentin [amoxicillin-pot clavulanate] and Codeine   History: Past Medical History:  Diagnosis Date   Anxiety    Broken ankle    Chronic left-sided lumbar radiculopathy 01/05/2022   Contraceptive management 11/21/2012   Depression    Dizziness - light-headed/fainting 02/20/2011   Dysphagia 10/23/2012   due to thyroid issues   Dysphagia 03/01/2011   Fatigue/Loss of Sleep 02/20/2011   FHx: allergies 02/20/2011   Ask patient to clarify. Only had "allergies" noted. Need more detail per medical history form dated 01/18/11.   Generalized headaches 02/20/2011   GERD (gastroesophageal reflux disease)    History of tonsillectomy    Hoarseness/sore throat 02/20/2011   Hypertension  Miscarriage    One - date not on medical form.   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- has been followed x 80yrs   Wears glasses/contacts 02/20/2011   Past Surgical History:  Procedure  Laterality Date   ABDOMINAL HYSTERECTOMY     ANTERIOR AND POSTERIOR REPAIR N/A 02/21/2017   Procedure: ANTERIOR (CYSTOCELE) AND POSTERIOR REPAIR (RECTOCELE);  Surgeon: Lavina Hamman, MD;  Location: [facility];  Service: Gynecology;  Laterality: N/A;   BILATERAL SALPINGECTOMY N/A 02/21/2017   Procedure: BILATERAL SALPINGECTOMY;  Surgeon: Lavina Hamman, MD;  Location: [facility];  Service: Gynecology;  Laterality: N/A;   BLADDER SUSPENSION N/A 02/21/2017   Procedure: Solyx Sling;  Surgeon: Lavina Hamman, MD;  Location: [facility];  Service: Gynecology;  Laterality: N/A;   broken ankle Right    Pins/rod inserted-retained   COLONOSCOPY     DILATION AND CURETTAGE OF UTERUS     MAB   FRACTURE SURGERY     ORIF ANKLE FRACTURE Right 12/19/2018   Procedure: Removal of hardware and open reduction internal fixation of right ankle bimalleolar fracture;  Surgeon: Toni Arthurs, MD;  Location: [facility];  Service: Orthopedics;  Laterality: Right;  ok to open room per [facility] at [facility]   THYROIDECTOMY N/A 10/29/2012   Procedure: THYROIDECTOMY;  Surgeon: Velora Heckler, MD;  Location: [facility];  Service: General;  Laterality: N/A;  Total Thyroidectomy   TONSILLECTOMY     VAGINAL HYSTERECTOMY Bilateral 02/21/2017   Procedure: HYSTERECTOMY VAGINAL;  Surgeon: Lavina Hamman, MD;  Location: [facility];  Service: Gynecology;  Laterality: Bilateral;   WISDOM TOOTH EXTRACTION     Family History  Problem Relation Age of Onset   Arthritis Mother    Cancer Mother    Heart disease Mother    Hypertension Mother    Kidney disease Mother    Obesity Mother    Heart disease Father    Alcohol abuse Father    Vision loss Father    Colon polyps Maternal Grandmother    Social History   Socioeconomic History   Marital status: Legally Separated    Spouse name: Not on file   Number of children: 5   Years of education: Not on file   Highest education level: Not on file  Occupational History   Not on  file  Tobacco Use   Smoking status: Never   Smokeless tobacco: Never  Vaping Use   Vaping status: Never Used  Substance and Sexual Activity   Alcohol use: Not Currently    Comment: rare -social   Drug use: No   Sexual activity: Not Currently    Birth control/protection: Abstinence, Condom, Post-menopausal  Other Topics Concern   Not on file  Social History Narrative   Not on file   Social Determinants of Health   Financial Resource Strain: Low Risk  (05/21/2023)   Overall Financial Resource Strain (CARDIA)    Difficulty of Paying Living Expenses: Not hard at all  Food Insecurity: No Food Insecurity (05/21/2023)   Hunger Vital Sign    Worried About Running Out of Food in the Last Year: Never true  Ran Out of Food in the Last Year: Never true  Transportation Needs: No Transportation Needs (05/21/2023)   PRAPARE - Administrator, Civil Service (Medical): No    Lack of Transportation (Non-Medical): No  Physical Activity: Insufficiently Active (05/21/2023)   Exercise Vital Sign    Days of Exercise per Week: 3 days    Minutes of Exercise per Session: 30 min  Stress: No Stress Concern Present (05/21/2023)   [employer] of Occupational Health - Occupational Stress Questionnaire    Feeling of Stress : Not at all  Social Connections: Moderately Integrated (05/21/2023)   Social Connection and Isolation Panel [NHANES]    Frequency of Communication with Friends and Family: More than three times a week    Frequency of Social Gatherings with Friends and Family: More than three times a week    Attends Religious Services: More than 4 times per year    Active Member of [employer] or Organizations: Yes    Attends Banker Meetings: 1 to 4 times per year    Marital Status: Divorced    Tobacco Counseling Counseling given: Not Answered   Clinical Intake:  Pre-visit preparation completed: Yes  Pain : No/denies pain     BMI - recorded: 28.29 Nutritional Status:  BMI 25 -29 Overweight Nutritional Risks: None  How often do you need to have someone help you when you read instructions, pamphlets, or other written materials from your doctor or pharmacy?: 1 - Never  Interpreter Needed?: No  Information entered by :: Lanier Ensign, LPN   Activities of Daily Living    05/21/2023   11:41 AM  In your present state of health, do you have any difficulty performing the following activities:  Hearing? 0  Vision? 1  Comment visually impaired  Difficulty concentrating or making decisions? 0  Walking or climbing stairs? 1  Comment at times  Dressing or bathing? 0  Doing errands, shopping? 0  Preparing Food and eating ? N  Using the Toilet? N  In the past six months, have you accidently leaked urine? N  Do you have problems with loss of bowel control? N  Managing your Medications? N  Managing your Finances? N  Housekeeping or managing your Housekeeping? N    Patient Care Team: Dulce Sellar, NP as PCP - General (Family Medicine)  Indicate any recent Medical Services you may have received from other than [facility] providers in the past year (date may be approximate).     Assessment:   This is a routine wellness examination for Alyssa Fisher.  Hearing/Vision screen Hearing Screening - Comments:: Pt denies any hearing issues  Vision Screening - Comments:: [facility]    Goals Addressed             This Visit's Progress    Patient Stated       Continue exercising and lose weight        Depression Screen    05/21/2023   11:45 AM 05/19/2022   10:10 AM 01/05/2022   10:38 AM  PHQ 2/9 Scores  PHQ - 2 Score 0 1 0    Fall Risk    05/19/2022   10:13 AM  Fall Risk   Falls in the past year? 1  Number falls in past yr: 1  Injury with Fall? 1  Comment foot sprain  Risk for fall due to : No Fall Risks;Impaired vision;Impaired mobility  Risk for fall due to: Comment related to sight  concerns  Follow up Falls prevention  discussed    MEDICARE RISK AT HOME: Medicare Risk at Home Any stairs in or around the home?: Yes If so, are there any without handrails?: No Home free of loose throw rugs in walkways, pet beds, electrical cords, etc?: Yes Adequate lighting in your home to reduce risk of falls?: Yes Life alert?: No Use of a cane, walker or w/c?: Yes Grab bars in the bathroom?: No Shower chair or bench in shower?: No Elevated toilet seat or a handicapped toilet?: No  TIMED UP AND GO:  Was the test performed?  No    Cognitive Function:        05/19/2022   10:16 AM  6CIT Screen  What Year? 0 points  What month? 0 points  What time? 0 points  Count back from 20 0 points  Months in reverse 0 points  Repeat phrase 0 points  Total Score 0 points    Immunizations Immunization History  Administered Date(s) Administered   Influenza Split 06/04/2012, 06/09/2013   Influenza, Seasonal, Injecte, Preservative Fre 05/05/2014   Influenza,inj,Quad PF,6+ Mos 05/02/2022   Influenza-Unspecified 05/05/2018   PFIZER(Purple Top)SARS-COV-2 Vaccination 05/28/2020, 12/09/2020   Pfizer Covid-19 Vaccine Bivalent Booster 55yrs & up 07/04/2021, 06/07/2022   Tdap 09/04/2008, 02/01/2022   Zoster Recombinant(Shingrix) 12/16/2021, 02/17/2022    TDAP status: Up to date  Flu Vaccine status: Due, Education has been provided regarding the importance of this vaccine. Advised may receive this vaccine at local pharmacy or Health Dept. Aware to provide a copy of the vaccination record if obtained from local pharmacy or Health Dept. Verbalized acceptance and understanding.    Covid-19 vaccine status: Information provided on how to obtain vaccines.   Qualifies for Shingles Vaccine? Yes   Zostavax completed Yes   Shingrix Completed?: Yes  Screening Tests Health Maintenance  Topic Date Due   HIV Screening  Never done   Hepatitis C Screening  Never done   COVID-19 Vaccine (5 - 2023-24 season) 05/06/2023   INFLUENZA  VACCINE  12/03/2023 (Originally 04/05/2023)   Medicare Annual Wellness (AWV)  05/20/2024   MAMMOGRAM  06/22/2024   Colonoscopy  04/28/2026   DTaP/Tdap/Td (3 - Td or Tdap) 02/02/2032   Zoster Vaccines- Shingrix  Completed   HPV VACCINES  Aged Out    Health Maintenance  Health Maintenance Due  Topic Date Due   HIV Screening  Never done   Hepatitis C Screening  Never done   COVID-19 Vaccine (5 - 2023-24 season) 05/06/2023    Colorectal cancer screening: Type of screening: Colonoscopy. Completed 04/28/26. Repeat every 10 years  Mammogram status: Completed 06/22/22. Repeat every year     Additional Screening:  Hepatitis C Screening: does qualify;  Vision Screening: Recommended annual ophthalmology exams for early detection of glaucoma and other disorders of the eye. Is the patient up to date with their annual eye exam?  Yes  Who is the provider or what is the name of the office in which the patient attends annual eye exams? [facility]  If pt is not established with a provider, would they like to be referred to a provider to establish care? No .   Dental Screening: Recommended annual dental exams for proper oral hygiene   Community Resource Referral / Chronic Care Management: CRR required this visit?  No   CCM required this visit?  No     Plan:     I have personally reviewed and noted the following in the patient's  chart:   Medical and social history Use of alcohol, tobacco or illicit drugs  Current medications and supplements including opioid prescriptions. Patient is not currently taking opioid prescriptions. Functional ability and status Nutritional status Physical activity Advanced directives List of other physicians Hospitalizations, surgeries, and ER visits in previous 12 months Vitals Screenings to include cognitive, depression, and falls Referrals and appointments  In addition, I have reviewed and discussed with patient certain preventive protocols,  quality metrics, and best practice recommendations. A written personalized care plan for preventive services as well as general preventive health recommendations were provided to patient.     Marzella Schlein, LPN   12/18/6061   After Visit Summary: (MyChart) Due to this being a telephonic visit, the after visit summary with patients personalized plan was offered to patient via MyChart   Nurse Notes: none

## 2023-05-24 NOTE — Progress Notes (Signed)
Subjective:   Alyssa Fisher is a 57 y.o. female who presents for Medicare Annual (Subsequent) preventive examination.  Visit Complete: Virtual  I connected with  [name] on 05/24/23 by a audio enabled telemedicine application and verified that I am speaking with the correct person using two identifiers.  Vital Signs: Unable to obtain new vitals due to this being a telehealth visit.   Patient Location: Home  Provider Location: Office/Clinic  I discussed the limitations of evaluation and management by telemedicine. The patient expressed understanding and agreed to proceed.   Cardiac Risk Factors include: advanced age (>31men, >40 women)     Objective:    Today's Vitals   05/21/23 1139  Weight: 170 lb (77.1 kg)   Body mass index is 28.29 kg/m.     05/21/2023   11:42 AM 05/19/2022   10:12 AM 12/19/2018    6:36 AM 12/18/2018   12:08 PM 09/19/2017    9:03 AM 02/21/2017    3:00 PM 02/13/2017    2:16 PM  Advanced Directives  Does Patient Have a Medical Advance Directive? No No No No No No No  Type of Furniture conservator/restorer;Living will       Does patient want to make changes to medical advance directive?  Yes (MAU/Ambulatory/Procedural Areas - Information given)       Would patient like information on creating a medical advance directive? No - Patient declined Yes (MAU/Ambulatory/Procedural Areas - Information given) No - Patient declined No - Patient declined No - Patient declined Yes (ED - Information included in AVS) Yes (MAU/Ambulatory/Procedural Areas - Information given)    Current Medications (verified) Outpatient Encounter Medications as of 05/21/2023  Medication Sig   ALPRAZolam (XANAX) 0.25 MG tablet Take 1 tablet (0.25 mg total) by mouth daily as needed for anxiety.   cetirizine (ZYRTEC) 10 MG tablet Take 10 mg by mouth daily.   diazepam (VALIUM) 5 MG tablet Take one tablet by mouth with food one hour prior to procedure. May repeat 30  minutes prior if needed.   DILT-XR 120 MG 24 hr capsule TAKE 1 CAPSULE BY MOUTH EVERY DAY   escitalopram (LEXAPRO) 10 MG tablet TAKE 2 TABLETS BY MOUTH EVERY DAY   levothyroxine (SYNTHROID) 75 MCG tablet TAKE 2 TABLETS BY MOUTH DAILY BEFORE BREAKFAST.   methocarbamol (ROBAXIN) 500 MG tablet Take 1 tablet (500 mg total) by mouth every 8 (eight) hours as needed for muscle spasms (For back pain.).   Multiple Vitamin (MULTIVITAMIN ADULT PO) Take by mouth.   omeprazole (PRILOSEC) 20 MG capsule TAKE 1 CAPSULE BY MOUTH EVERY DAY   Semaglutide, 1 MG/DOSE, 4 MG/3ML SOPN Inject 1 mg as directed once a week.   No facility-administered encounter medications on file as of 05/21/2023.    Allergies (verified) Augmentin [amoxicillin-pot clavulanate] and Codeine   History: Past Medical History:  Diagnosis Date   Anxiety    Broken ankle    Chronic left-sided lumbar radiculopathy 01/05/2022   Contraceptive management 11/21/2012   Depression    Dizziness - light-headed/fainting 02/20/2011   Dysphagia 10/23/2012   due to thyroid issues   Dysphagia 03/01/2011   Fatigue/Loss of Sleep 02/20/2011   FHx: allergies 02/20/2011   Ask patient to clarify. Only had "allergies" noted. Need more detail per medical history form dated 01/18/11.   Generalized headaches 02/20/2011   GERD (gastroesophageal reflux disease)    History of tonsillectomy    Hoarseness/sore throat 02/20/2011   Hypertension  Miscarriage    One - date not on medical form.   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- has been followed x 65yrs   Wears glasses/contacts 02/20/2011   Past Surgical History:  Procedure  Laterality Date   ABDOMINAL HYSTERECTOMY     ANTERIOR AND POSTERIOR REPAIR N/A 02/21/2017   Procedure: ANTERIOR (CYSTOCELE) AND POSTERIOR REPAIR (RECTOCELE);  Surgeon: Lavina Hamman, MD;  Location: [facility];  Service: Gynecology;  Laterality: N/A;   BILATERAL SALPINGECTOMY N/A 02/21/2017   Procedure: BILATERAL SALPINGECTOMY;  Surgeon: Lavina Hamman, MD;  Location: [facility];  Service: Gynecology;  Laterality: N/A;   BLADDER SUSPENSION N/A 02/21/2017   Procedure: Solyx Sling;  Surgeon: Lavina Hamman, MD;  Location: [facility];  Service: Gynecology;  Laterality: N/A;   broken ankle Right    Pins/rod inserted-retained   COLONOSCOPY     DILATION AND CURETTAGE OF UTERUS     MAB   FRACTURE SURGERY     ORIF ANKLE FRACTURE Right 12/19/2018   Procedure: Removal of hardware and open reduction internal fixation of right ankle bimalleolar fracture;  Surgeon: Toni Arthurs, MD;  Location: [facility];  Service: Orthopedics;  Laterality: Right;  ok to open room per [facility] at [facility]   THYROIDECTOMY N/A 10/29/2012   Procedure: THYROIDECTOMY;  Surgeon: Velora Heckler, MD;  Location: [facility];  Service: General;  Laterality: N/A;  Total Thyroidectomy   TONSILLECTOMY     VAGINAL HYSTERECTOMY Bilateral 02/21/2017   Procedure: HYSTERECTOMY VAGINAL;  Surgeon: Lavina Hamman, MD;  Location: [facility];  Service: Gynecology;  Laterality: Bilateral;   WISDOM TOOTH EXTRACTION     Family History  Problem Relation Age of Onset   Arthritis Mother    Cancer Mother    Heart disease Mother    Hypertension Mother    Kidney disease Mother    Obesity Mother    Heart disease Father    Alcohol abuse Father    Vision loss Father    Colon polyps Maternal Grandmother    Social History   Socioeconomic History   Marital status: Legally Separated    Spouse name: Not on file   Number of children: 5   Years of education: Not on file   Highest education level: Not on file  Occupational History   Not on  file  Tobacco Use   Smoking status: Never   Smokeless tobacco: Never  Vaping Use   Vaping status: Never Used  Substance and Sexual Activity   Alcohol use: Not Currently    Comment: rare -social   Drug use: No   Sexual activity: Not Currently    Birth control/protection: Abstinence, Condom, Post-menopausal  Other Topics Concern   Not on file  Social History Narrative   Not on file   Social Determinants of Health   Financial Resource Strain: Low Risk  (05/21/2023)   Overall Financial Resource Strain (CARDIA)    Difficulty of Paying Living Expenses: Not hard at all  Food Insecurity: No Food Insecurity (05/21/2023)   Hunger Vital Sign    Worried About Running Out of Food in the Last Year: Never true  Ran Out of Food in the Last Year: Never true  Transportation Needs: No Transportation Needs (05/21/2023)   PRAPARE - Administrator, Civil Service (Medical): No    Lack of Transportation (Non-Medical): No  Physical Activity: Insufficiently Active (05/21/2023)   Exercise Vital Sign    Days of Exercise per Week: 3 days    Minutes of Exercise per Session: 30 min  Stress: No Stress Concern Present (05/21/2023)   [employer] of Occupational Health - Occupational Stress Questionnaire    Feeling of Stress : Not at all  Social Connections: Moderately Integrated (05/21/2023)   Social Connection and Isolation Panel [NHANES]    Frequency of Communication with Friends and Family: More than three times a week    Frequency of Social Gatherings with Friends and Family: More than three times a week    Attends Religious Services: More than 4 times per year    Active Member of [employer] or Organizations: Yes    Attends Banker Meetings: 1 to 4 times per year    Marital Status: Divorced    Tobacco Counseling Counseling given: Not Answered   Clinical Intake:  Pre-visit preparation completed: Yes  Pain : No/denies pain     BMI - recorded: 28.29 Nutritional Status:  BMI 25 -29 Overweight Nutritional Risks: None  How often do you need to have someone help you when you read instructions, pamphlets, or other written materials from your doctor or pharmacy?: 1 - Never  Interpreter Needed?: No  Information entered by :: Lanier Ensign, LPN   Activities of Daily Living    05/21/2023   11:41 AM  In your present state of health, do you have any difficulty performing the following activities:  Hearing? 0  Vision? 1  Comment visually impaired  Difficulty concentrating or making decisions? 0  Walking or climbing stairs? 1  Comment at times  Dressing or bathing? 0  Doing errands, shopping? 0  Preparing Food and eating ? N  Using the Toilet? N  In the past six months, have you accidently leaked urine? N  Do you have problems with loss of bowel control? N  Managing your Medications? N  Managing your Finances? N  Housekeeping or managing your Housekeeping? N    Patient Care Team: Dulce Sellar, NP as PCP - General (Family Medicine)  Indicate any recent Medical Services you may have received from other than [facility] providers in the past year (date may be approximate).     Assessment:   This is a routine wellness examination for Lamae.  Hearing/Vision screen Hearing Screening - Comments:: Pt denies any hearing issues  Vision Screening - Comments:: [facility]    Goals Addressed             This Visit's Progress    Patient Stated       Continue exercising and lose weight        Depression Screen    05/21/2023   11:45 AM 05/19/2022   10:10 AM 01/05/2022   10:38 AM  PHQ 2/9 Scores  PHQ - 2 Score 0 1 0    Fall Risk    05/19/2022   10:13 AM  Fall Risk   Falls in the past year? 1  Number falls in past yr: 1  Injury with Fall? 1  Comment foot sprain  Risk for fall due to : No Fall Risks;Impaired vision;Impaired mobility  Risk for fall due to: Comment related to sight  concerns  Follow up Falls prevention  discussed    MEDICARE RISK AT HOME: Medicare Risk at Home Any stairs in or around the home?: Yes If so, are there any without handrails?: No Home free of loose throw rugs in walkways, pet beds, electrical cords, etc?: Yes Adequate lighting in your home to reduce risk of falls?: Yes Life alert?: No Use of a cane, walker or w/c?: Yes Grab bars in the bathroom?: No Shower chair or bench in shower?: No Elevated toilet seat or a handicapped toilet?: No  TIMED UP AND GO:  Was the test performed?  No    Cognitive Function:        05/21/2023   10:32 AM 05/19/2022   10:16 AM  6CIT Screen  What Year? 0 points 0 points  What month? 0 points 0 points  What time? 0 points 0 points  Count back from 20 0 points 0 points  Months in reverse 0 points 0 points  Repeat phrase 0 points 0 points  Total Score 0 points 0 points    Immunizations Immunization History  Administered Date(s) Administered   Influenza Split 06/04/2012, 06/09/2013   Influenza, Seasonal, Injecte, Preservative Fre 05/05/2014   Influenza,inj,Quad PF,6+ Mos 05/02/2022   Influenza-Unspecified 05/05/2018   PFIZER(Purple Top)SARS-COV-2 Vaccination 05/28/2020, 12/09/2020   Pfizer Covid-19 Vaccine Bivalent Booster 1yrs & up 07/04/2021, 06/07/2022   Tdap 09/04/2008, 02/01/2022   Zoster Recombinant(Shingrix) 12/16/2021, 02/17/2022    TDAP status: Up to date  Flu Vaccine status: Due, Education has been provided regarding the importance of this vaccine. Advised may receive this vaccine at local pharmacy or Health Dept. Aware to provide a copy of the vaccination record if obtained from local pharmacy or Health Dept. Verbalized acceptance and understanding.    Covid-19 vaccine status: Information provided on how to obtain vaccines.   Qualifies for Shingles Vaccine? Yes   Zostavax completed Yes   Shingrix Completed?: Yes  Screening Tests Health Maintenance  Topic Date Due   HIV Screening  Never done   Hepatitis C  Screening  Never done   COVID-19 Vaccine (5 - 2023-24 season) 05/06/2023   INFLUENZA VACCINE  12/03/2023 (Originally 04/05/2023)   Medicare Annual Wellness (AWV)  05/20/2024   MAMMOGRAM  06/22/2024   Colonoscopy  04/28/2026   DTaP/Tdap/Td (3 - Td or Tdap) 02/02/2032   Zoster Vaccines- Shingrix  Completed   HPV VACCINES  Aged Out    Health Maintenance  Health Maintenance Due  Topic Date Due   HIV Screening  Never done   Hepatitis C Screening  Never done   COVID-19 Vaccine (5 - 2023-24 season) 05/06/2023    Colorectal cancer screening: Type of screening: Colonoscopy. Completed 04/28/26. Repeat every 10 years  Mammogram status: Completed 06/22/22. Repeat every year     Additional Screening:  Hepatitis C Screening: does qualify;  Vision Screening: Recommended annual ophthalmology exams for early detection of glaucoma and other disorders of the eye. Is the patient up to date with their annual eye exam?  Yes  Who is the provider or what is the name of the office in which the patient attends annual eye exams? [facility]  If pt is not established with a provider, would they like to be referred to a provider to establish care? No .   Dental Screening: Recommended annual dental exams for proper oral hygiene   Community Resource Referral / Chronic Care Management: CRR required this visit?  No   CCM required this visit?  No  Plan:     I have personally reviewed and noted the following in the patient's chart:   Medical and social history Use of alcohol, tobacco or illicit drugs  Current medications and supplements including opioid prescriptions. Patient is not currently taking opioid prescriptions. Functional ability and status Nutritional status Physical activity Advanced directives List of other physicians Hospitalizations, surgeries, and ER visits in previous 12 months Vitals Screenings to include cognitive, depression, and falls Referrals and  appointments  In addition, I have reviewed and discussed with patient certain preventive protocols, quality metrics, and best practice recommendations. A written personalized care plan for preventive services as well as general preventive health recommendations were provided to patient.     Marzella Schlein, LPN   [id number]   After Visit Summary: (MyChart) Due to this being a telephonic visit, the after visit summary with patients personalized plan was offered to patient via MyChart   Nurse Notes: none

## 2023-05-28 ENCOUNTER — Other Ambulatory Visit: Payer: Self-pay

## 2023-05-28 ENCOUNTER — Ambulatory Visit (INDEPENDENT_AMBULATORY_CARE_PROVIDER_SITE_OTHER): Payer: 59 | Admitting: Physical Medicine and Rehabilitation

## 2023-05-28 VITALS — BP 123/83 | HR 75

## 2023-05-28 DIAGNOSIS — M47816 Spondylosis without myelopathy or radiculopathy, lumbar region: Secondary | ICD-10-CM

## 2023-05-28 MED ORDER — METHYLPREDNISOLONE ACETATE 80 MG/ML IJ SUSP: 80.0000 mg | Freq: Once | INTRAMUSCULAR | Status: DC

## 2023-05-28 NOTE — Progress Notes (Unsigned)
Functional Pain Scale - descriptive words and definitions  Distracting (5)    Aware of pain/able to complete some ADL's but limited by pain/sleep is affected and active distractions are only slightly useful. Moderate range order  Average Pain 6-7   +Driver, -BT, -Dye Allergies.  Lower back pain on left side

## 2023-05-28 NOTE — Patient Instructions (Signed)

## 2023-05-29 ENCOUNTER — Encounter: Payer: Self-pay | Admitting: Physical Medicine and Rehabilitation

## 2023-05-30 ENCOUNTER — Other Ambulatory Visit: Payer: Self-pay | Admitting: Physical Medicine and Rehabilitation

## 2023-05-30 DIAGNOSIS — G8929 Other chronic pain: Secondary | ICD-10-CM

## 2023-05-30 DIAGNOSIS — M47816 Spondylosis without myelopathy or radiculopathy, lumbar region: Secondary | ICD-10-CM

## 2023-05-30 MED ORDER — DIAZEPAM 5 MG PO TABS: ORAL_TABLET | ORAL | 0 refills | Status: DC

## 2023-05-30 NOTE — Procedures (Signed)
Lumbar Diagnostic Facet Joint Nerve Block with Fluoroscopic Guidance   Patient: Alyssa Fisher      Date of Birth: 08-27-1966 MRN: [id number] PCP: Dulce Sellar, NP      Visit Date: 05/28/2023   Universal Protocol:    Date/Time: 05/30/2411:32 PM  Consent Given By: the patient  Position: PRONE  Additional Comments: Vital signs were monitored before and after the procedure. Patient was prepped and draped in the usual sterile fashion. The correct patient, procedure, and site was verified.   Injection Procedure Details:   Procedure diagnoses:  1. Spondylosis without myelopathy or radiculopathy, lumbar region      Meds Administered:  Meds ordered this encounter  Medications   DISCONTD: methylPREDNISolone acetate (DEPO-MEDROL) injection 80 mg     Laterality: Left  Location/Site: L4-L5, L3 and L4 medial branches and L5-S1, L4 medial branch and L5 dorsal ramus  Needle: 5.0 in., 25 ga.  Short bevel or Quincke spinal needle  Needle Placement: Oblique pedical  Findings:   -Comments: There was excellent flow of contrast along the articular pillars without intravascular flow.  Procedure Details: The fluoroscope beam is vertically oriented in AP and then obliqued 15 to 20 degrees to the ipsilateral side of the desired nerve to achieve the "Scotty dog" appearance.  The skin over the target area of the junction of the superior articulating process and the transverse process (sacral ala if blocking the L5 dorsal rami) was locally anesthetized with a 1 ml volume of 1% Lidocaine without Epinephrine.  The spinal needle was inserted and advanced in a trajectory view down to the target.   After contact with periosteum and negative aspirate for blood and CSF, correct placement without intravascular or epidural spread was confirmed by injecting 0.5 ml. of Isovue-250.  A spot radiograph was obtained of this image.    Next, a 0.5 ml. volume of the injectate described above was injected. The  needle was then redirected to the other facet joint nerves mentioned above if needed.  Prior to the procedure, the patient was given a Pain Diary which was completed for baseline measurements.  After the procedure, the patient rated their pain every 30 minutes and will continue rating at this frequency for a total of 5 hours.  The patient has been asked to complete the Diary and return to [place] by mail, fax or hand delivered as soon as possible.   Additional Comments:  The patient tolerated the procedure well Dressing: 2 x 2 sterile gauze and Band-Aid    Post-procedure details: Patient was observed during the procedure. Post-procedure instructions were reviewed.  Patient left the clinic in stable condition.

## 2023-05-30 NOTE — Progress Notes (Signed)
Alyssa Fisher - 57 y.o. female MRN [id number]  Date of birth: 06-13-1966  Office Visit Note: Visit Date: 05/28/2023 PCP: Dulce Sellar, NP Referred by: Dulce Sellar, NP  Subjective: Chief Complaint  Patient presents with   Lower Back - Pain   HPI:  Alyssa Fisher is a 57 y.o. female who comes in today for planned repeat Left L4-5 and L5-S1 Lumbar facet/medial branch block with fluoroscopic guidance.  The patient has failed conservative care including home exercise, medications, time and activity modification.  This injection will be diagnostic and hopefully therapeutic.  Please see requesting physician notes for further details and justification.  Exam shows concordant low back pain with facet joint loading and extension. Patient received more than 80% pain relief from prior injection. This would be the second block in a diagnostic double block paradigm.  Initially the patient described not having much relief with prior diagnostic facet blocks on the left side.  With further discussion she got actually excellent relief it was just not long-lasting.  We explained to her that these are not typically long-lasting and they were doing these as a plan to look forward to radiofrequency ablation which could be a long-lasting procedure and this was gone over with her today at length.   Referring:Megan Mayford Knife, FNP and Dr. Huel Cote   ROS Otherwise per HPI.  Assessment & Plan: Visit Diagnoses:    ICD-10-CM   1. Spondylosis without myelopathy or radiculopathy, lumbar region  M47.816 XR C-ARM NO REPORT    Facet Injection    DISCONTINUED: methylPREDNISolone acetate (DEPO-MEDROL) injection 80 mg      Plan: No additional findings.   Meds & Orders:  Meds ordered this encounter  Medications   DISCONTD: methylPREDNISolone acetate (DEPO-MEDROL) injection 80 mg    Orders Placed This Encounter  Procedures   Facet Injection   XR C-ARM NO REPORT    Follow-up: Return for visit to  requesting provider as needed.   Procedures: No procedures performed  Lumbar Diagnostic Facet Joint Nerve Block with Fluoroscopic Guidance   Patient: Alyssa Fisher      Date of Birth: March 17, 1966 MRN: [id number] PCP: Dulce Sellar, NP      Visit Date: 05/28/2023   Universal Protocol:    Date/Time: 05/30/2411:32 PM  Consent Given By: the patient  Position: PRONE  Additional Comments: Vital signs were monitored before and after the procedure. Patient was prepped and draped in the usual sterile fashion. The correct patient, procedure, and site was verified.   Injection Procedure Details:   Procedure diagnoses:  1. Spondylosis without myelopathy or radiculopathy, lumbar region      Meds Administered:  Meds ordered this encounter  Medications   DISCONTD: methylPREDNISolone acetate (DEPO-MEDROL) injection 80 mg     Laterality: Left  Location/Site: L4-L5, L3 and L4 medial branches and L5-S1, L4 medial branch and L5 dorsal ramus  Needle: 5.0 in., 25 ga.  Short bevel or Quincke spinal needle  Needle Placement: Oblique pedical  Findings:   -Comments: There was excellent flow of contrast along the articular pillars without intravascular flow.  Procedure Details: The fluoroscope beam is vertically oriented in AP and then obliqued 15 to 20 degrees to the ipsilateral side of the desired nerve to achieve the "Scotty dog" appearance.  The skin over the target area of the junction of the superior articulating process and the transverse process (sacral ala if blocking the L5 dorsal rami) was locally anesthetized with a 1 ml volume of 1% Lidocaine  without Epinephrine.  The spinal needle was inserted and advanced in a trajectory view down to the target.   After contact with periosteum and negative aspirate for blood and CSF, correct placement without intravascular or epidural spread was confirmed by injecting 0.5 ml. of Isovue-250.  A spot radiograph was obtained of this image.     Next, a 0.5 ml. volume of the injectate described above was injected. The needle was then redirected to the other facet joint nerves mentioned above if needed.  Prior to the procedure, the patient was given a Pain Diary which was completed for baseline measurements.  After the procedure, the patient rated their pain every 30 minutes and will continue rating at this frequency for a total of 5 hours.  The patient has been asked to complete the Diary and return to [place] by mail, fax or hand delivered as soon as possible.   Additional Comments:  The patient tolerated the procedure well Dressing: 2 x 2 sterile gauze and Band-Aid    Post-procedure details: Patient was observed during the procedure. Post-procedure instructions were reviewed.  Patient left the clinic in stable condition.   Clinical History: MRI LUMBAR SPINE WITHOUT CONTRAST   TECHNIQUE: Multiplanar, multisequence MR imaging of the lumbar spine was performed. No intravenous contrast was administered.   COMPARISON:  None Available.   FINDINGS: Segmentation:  Standard.   Alignment:  Trace retrolisthesis of L2 over L3.   Vertebrae:  No fracture, evidence of discitis, or bone lesion.   Conus medullaris and cauda equina: Conus extends to the L1-2 level. Conus and cauda equina appear normal.   Paraspinal and other soft tissues: Negative.   Disc levels:   T12-L1: Small left central disc protrusion causing minimal indentation on the thecal sac. No spinal canal or neural foraminal stenosis.   L1-2: Small central disc protrusion causing small indentation of the thecal sac without significant spinal canal or neural foraminal stenosis.   L2-3: Minimal disc bulge. No spinal canal or neural foraminal stenosis.   L3-4: No spinal canal or neural foraminal stenosis.   L4-5: Shallow disc bulge and facet hypertrophy without significant spinal canal or neural foraminal stenosis.   L5-S1: Small central annular tear and  facet hypertrophy. No significant spinal canal or neural foraminal stenosis.   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 No high-grade spinal canal or neural foraminal stenosis at any level.     Electronically Signed   By: Baldemar Lenis M.D.   On: 01/31/2022 13:16     Objective:  VS:  HT:    WT:   BMI:     BP:123/83  HR:75bpm  TEMP: ( )  RESP:  Physical Exam Vitals and nursing note reviewed.  Constitutional:      General: She is not in acute distress.    Appearance: Normal appearance. She is not ill-appearing.  HENT:     Head: Normocephalic and atraumatic.     Right Ear: External ear normal.     Left Ear: External ear normal.  Eyes:     Extraocular Movements: Extraocular movements intact.  Cardiovascular:     Rate and Rhythm: Normal rate.     Pulses: Normal pulses.  Pulmonary:     Effort: Pulmonary effort is normal. No respiratory distress.  Abdominal:     General: There is no distension.     Palpations: Abdomen is soft.  Musculoskeletal:        General:  Tenderness present.     Cervical back: Neck supple.     Right lower leg: No edema.     Left lower leg: No edema.     Comments: Patient has good distal strength with no pain over the greater trochanters.  No clonus or focal weakness.  Skin:    Findings: No erythema, lesion or rash.  Neurological:     General: No focal deficit present.     Mental Status: She is alert and oriented to person, place, and time.     Sensory: No sensory deficit.     Motor: No weakness or abnormal muscle tone.     Coordination: Coordination normal.  Psychiatric:        Mood and Affect: Mood normal.        Behavior: Behavior normal.      Imaging: No results found.

## 2023-06-01 ENCOUNTER — Telehealth: Payer: Self-pay

## 2023-06-01 DIAGNOSIS — F411 Generalized anxiety disorder: Secondary | ICD-10-CM

## 2023-06-01 NOTE — Telephone Encounter (Signed)
Patient is scheduled for RFA on 06/13/23. Needs pre procedure Valium sent to [facility] [address]

## 2023-06-02 ENCOUNTER — Other Ambulatory Visit: Payer: Self-pay | Admitting: Family

## 2023-06-04 MED ORDER — DIAZEPAM 5 MG PO TABS: ORAL_TABLET | ORAL | 0 refills | Status: DC

## 2023-06-04 NOTE — Addendum Note (Signed)
Addended by: Ashok Norris on: 06/04/2023 08:11 AM   Modules accepted: Orders

## 2023-06-13 ENCOUNTER — Ambulatory Visit (INDEPENDENT_AMBULATORY_CARE_PROVIDER_SITE_OTHER): Payer: 59 | Admitting: Physical Medicine and Rehabilitation

## 2023-06-13 ENCOUNTER — Other Ambulatory Visit: Payer: Self-pay

## 2023-06-13 DIAGNOSIS — M47816 Spondylosis without myelopathy or radiculopathy, lumbar region: Secondary | ICD-10-CM

## 2023-06-13 MED ORDER — METHYLPREDNISOLONE ACETATE 40 MG/ML IJ SUSP
40.0000 mg | Freq: Once | INTRAMUSCULAR | Status: DC
Start: 2023-06-13 — End: 2023-06-21

## 2023-06-13 NOTE — Progress Notes (Signed)
Functional Pain Scale - descriptive words and definitions  Mild (2)   Noticeable when not distracted/no impact on ADL's/sleep only slightly affected and able to   use both passive and active distraction for comfort. Mild range order  Average Pain 0   +Driver, -BT, -Dye Allergies.  Lower back pain on left side

## 2023-06-13 NOTE — Patient Instructions (Signed)

## 2023-06-21 ENCOUNTER — Ambulatory Visit (INDEPENDENT_AMBULATORY_CARE_PROVIDER_SITE_OTHER): Payer: 59 | Admitting: Family

## 2023-06-21 ENCOUNTER — Encounter: Payer: Self-pay | Admitting: Family

## 2023-06-21 VITALS — BP 133/67 | HR 89 | Temp 97.8°F | Ht 66.0 in | Wt 173.2 lb

## 2023-06-21 DIAGNOSIS — F419 Anxiety disorder, unspecified: Secondary | ICD-10-CM | POA: Diagnosis not present

## 2023-06-21 DIAGNOSIS — J342 Deviated nasal septum: Secondary | ICD-10-CM

## 2023-06-21 DIAGNOSIS — E890 Postprocedural hypothyroidism: Secondary | ICD-10-CM | POA: Diagnosis not present

## 2023-06-21 DIAGNOSIS — R635 Abnormal weight gain: Secondary | ICD-10-CM

## 2023-06-21 DIAGNOSIS — I10 Essential (primary) hypertension: Secondary | ICD-10-CM

## 2023-06-21 DIAGNOSIS — M5416 Radiculopathy, lumbar region: Secondary | ICD-10-CM | POA: Diagnosis not present

## 2023-06-21 DIAGNOSIS — Z1159 Encounter for screening for other viral diseases: Secondary | ICD-10-CM | POA: Diagnosis not present

## 2023-06-21 DIAGNOSIS — Z114 Encounter for screening for human immunodeficiency virus [HIV]: Secondary | ICD-10-CM

## 2023-06-21 LAB — TSH: TSH: 0.05 u[IU]/mL — ABNORMAL LOW (ref 0.35–5.50)

## 2023-06-21 MED ORDER — OZEMPIC (0.25 OR 0.5 MG/DOSE) 2 MG/3ML ~~LOC~~ SOPN
0.5000 mg | PEN_INJECTOR | SUBCUTANEOUS | 2 refills | Status: DC
Start: 2023-06-21 — End: 2024-03-18

## 2023-06-21 MED ORDER — LEVOTHYROXINE SODIUM 75 MCG PO TABS
75.0000 ug | ORAL_TABLET | Freq: Every day | ORAL | 3 refills | Status: DC
Start: 2023-06-21 — End: 2023-07-09

## 2023-06-21 NOTE — Assessment & Plan Note (Signed)
chronic taking Ozempic qweekly since 05/2022 increased dose to 1mg  last visit, reports not tolerated refilling lower dose 0.5mg  f/u in 3-4 mos

## 2023-06-21 NOTE — Assessment & Plan Note (Signed)
Chronic  last TSH very low, but T4 in normal range  pt taking Levo qd  rechecking level today  f/u 6 mos

## 2023-06-21 NOTE — Assessment & Plan Note (Signed)
Chronic stable on Diltiazem sending refill f/u in 6 mos

## 2023-06-21 NOTE — Assessment & Plan Note (Signed)
chronic, stable taking Lexapro 20mg  daily, Xanax 0.25mg  prn sending Lexapro refill f/u in 6 mos or prn

## 2023-06-21 NOTE — Progress Notes (Signed)
Patient ID: Alyssa Fisher, female    DOB: 1966/06/19, 57 y.o.   MRN: [id number]  Chief Complaint  Patient presents with   Hypothyroidism    Pt c/o fatigue and hair loss, worsening.    *Discussed the use of AI scribe software for clinical note transcription with the patient, who gave verbal consent to proceed.  History of Present Illness   Alyssa Fisher, a patient with a history of obesity, hypertension and chronic pain, presents today with concerns about side effects from her weight loss medication, Ozempic. She reports experiencing severe nausea and a loss of appetite since her dosage was increased to one milligram. She expresses a desire to return to the lower dosage of 0.5 milligrams, which she tolerated better.  In addition to her medication concerns, Alyssa Fisher also reports a significant improvement in her chronic pain following an ablation procedure performed by her orthopedic doctor. She is currently pain-free and hopes the effects of the procedure will last for at least a year.  Alyssa Fisher also mentions occasional leg, ankle, and feet swelling, which she attributes to busy days with lots of errands. She expresses concern due to her mother's history of lymphedema.  Lastly, Alyssa Fisher reports issues with her nose, suspecting a worsened deviated septum and possible collapse on her left side. She has a history of having broken her nose a couple of times.    Hypertension & Mitral valve prolapse: Patient is currently maintained on the following medications for blood pressure: Diltiazem 120mg  qd Failed meds include: none Patient reports good compliance with blood pressure medications. Patient denies chest pain, headaches, shortness of breath or swelling. Last 3 blood pressure readings in our office are as follows: BP Readings from Last 3 Encounters:  06/21/23 133/67  05/28/23 123/83  04/18/23 112/80    Hypothyroidism: Patient presents today for followup of Hypothyroidism.  Patient reports positive compliance with  daily medication- Levothyroxine qd.  Patient denies any of the following symptoms: fatigue, cold intolerance, constipation, weight gain or inability to lose weight, muscle weakness, mental slowing, dry hair and skin. Last TSH very low, but her actual T4 in normal range, wants to recheck today. Last TSH and free T4: Lab Results  Component Value Date   FREE T4 1.19 05/02/2022   FREE T4 1.26 01/05/2022   TSH 0.05 (L) 06/21/2023   TSH 0.36 05/02/2022   TSH 0.04 (L) 01/05/2022    Abnormal weight gain:  pt denies ever trying previous wt loss programs or medications. Reports trying a "Keto-like" diet, with exercise and lost about 20lbs. She is unable to exercise at the gym as her mother's health has been bad and she is her transportation d/t pt unable to drive d/t poor vision. She reports since having her thyroid removed, her weight has been up and down. Taking Ozempic for months, increased to 1mg , can't tolerate, wants to go back down to 0.5mg .  Anxiety/Depression: Patient complains of anxiety disorder.   She has the following symptoms: none. Onset of symptoms was approximately years ago, She denies current suicidal and homicidal ideation. Possible organic causes contributing are: none. Risk factors: none. Previous treatment includes Lexapro and Xanax and individual therapy.  She complains of the following side effects from the treatment: not as effective. Pt reports Buspar  caused "brain zaps". Taking Lexapro 10mg  qd & Xanax prn,   Assessment & Plan:     Weight Management - Patient experienced significant side effects (nausea, loss of appetite) with Ozempic 1mg . Previously plateaued on 0.5mg  dose. -  Reduce Ozempic to 0.5mg  dose as tolerated by patient. -F/U in 3-4 mos.  Hypertension - Well controlled on Diltiazem. -Continue Diltiazem. -F/U in 6mos  Lower Extremity Edema - Occasional swelling noted in past, none on exam today, likely lifestyle related, due to long rides in car, on her  feet more, increased Na intake.  Hx of Mild HTN & mitral valve prolapse. -Encourage ankle pumps, mild compression socks, salt intake monitoring, and adequate hydration with 2L water daily.  Nasal Obstruction - Patient reports worsening symptoms, possibly due to deviated septum. -Refer to ENT for evaluation.  Hypothyroidism -  -Check thyroid levels today. -Refill Levothyroxine qd. -F/U 1 yr    Need for hepatitis C screening test -     Hepatitis C antibody  Screening for HIV (human immunodeficiency virus) -     HIV Antibody (routine testing w rflx)   Subjective:    Outpatient Medications Prior to Visit  Medication Sig Dispense Refill   ALPRAZolam (XANAX) 0.25 MG tablet Take 1 tablet (0.25 mg total) by mouth daily as needed for anxiety.     cetirizine (ZYRTEC) 10 MG tablet Take 10 mg by mouth daily.     diazepam (VALIUM) 5 MG tablet Take 1 by mouth 1 hour  pre-procedure with very light food. May bring 2nd tablet to appointment. 2 tablet 0   DILT-XR 120 MG 24 hr capsule TAKE 1 CAPSULE BY MOUTH EVERY DAY 90 capsule 1   escitalopram (LEXAPRO) 10 MG tablet TAKE 2 TABLETS BY MOUTH EVERY DAY 180 tablet 1   Multiple Vitamin (MULTIVITAMIN ADULT PO) Take by mouth.     omeprazole (PRILOSEC) 20 MG capsule TAKE 1 CAPSULE BY MOUTH EVERY DAY 90 capsule 0   levothyroxine (SYNTHROID) 75 MCG tablet TAKE 2 TABLETS BY MOUTH DAILY BEFORE BREAKFAST. 180 tablet 1   methocarbamol (ROBAXIN) 500 MG tablet Take 1 tablet (500 mg total) by mouth every 8 (eight) hours as needed for muscle spasms (For back pain.). 60 tablet 0   Semaglutide, 1 MG/DOSE, 4 MG/3ML SOPN Inject 1 mg as directed once a week. 3 mL 2   methylPREDNISolone acetate (DEPO-MEDROL) injection 40 mg      No facility-administered medications prior to visit.   Past Medical History:  Diagnosis Date   Anxiety    Broken ankle    Chronic left-sided lumbar radiculopathy 01/05/2022   Contraceptive management 11/21/2012   Depression     Dizziness - light-headed/fainting 02/20/2011   Dysphagia 10/23/2012   due to thyroid issues   Dysphagia 03/01/2011   Fatigue/Loss of Sleep 02/20/2011   FHx: allergies 02/20/2011   Ask patient to clarify. Only had "allergies" noted. Need more detail per medical history form dated 01/18/11.   Generalized headaches 02/20/2011   GERD (gastroesophageal reflux disease)    History of tonsillectomy    Hoarseness/sore throat 02/20/2011   Hypertension    Miscarriage    One - date not on medical form.   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   Rosacea    Runny nose 02/20/2011   Seasonal allergies    uses albuterol inhaler as needed for allergies   Sinus problem 02/20/2011   Thyroid goiter 10/23/2012   multinodular- has been followed x 54yrs   Wears glasses/contacts 02/20/2011   Past Surgical History:  Procedure Laterality  Date   ABDOMINAL HYSTERECTOMY     ANTERIOR AND POSTERIOR REPAIR N/A 02/21/2017   Procedure: ANTERIOR (CYSTOCELE) AND POSTERIOR REPAIR (RECTOCELE);  Surgeon: Lavina Hamman, MD;  Location: [facility];  Service: Gynecology;  Laterality: N/A;   BILATERAL SALPINGECTOMY N/A 02/21/2017   Procedure: BILATERAL SALPINGECTOMY;  Surgeon: Lavina Hamman, MD;  Location: [facility];  Service: Gynecology;  Laterality: N/A;   BLADDER SUSPENSION N/A 02/21/2017   Procedure: Solyx Sling;  Surgeon: Lavina Hamman, MD;  Location: [facility];  Service: Gynecology;  Laterality: N/A;   broken ankle Right    Pins/rod inserted-retained   COLONOSCOPY     DILATION AND CURETTAGE OF UTERUS     MAB   FRACTURE SURGERY     ORIF ANKLE FRACTURE Right 12/19/2018   Procedure: Removal of hardware and open reduction internal fixation of right ankle bimalleolar fracture;  Surgeon: Toni Arthurs, MD;  Location:  SURGERY  CENTER;  Service: Orthopedics;  Laterality: Right;  ok to open room per [facility] at [facility]   THYROIDECTOMY N/A 10/29/2012   Procedure: THYROIDECTOMY;  Surgeon: Velora Heckler, MD;  Location: [facility];  Service: General;  Laterality: N/A;  Total Thyroidectomy   TONSILLECTOMY     VAGINAL HYSTERECTOMY Bilateral 02/21/2017   Procedure: HYSTERECTOMY VAGINAL;  Surgeon: Lavina Hamman, MD;  Location: [facility];  Service: Gynecology;  Laterality: Bilateral;   WISDOM TOOTH EXTRACTION     Allergies  Allergen Reactions   Augmentin [Amoxicillin-Pot Clavulanate] Diarrhea   Codeine     Patient stated "possible" due to mother and grandparents being allergic as well. Per medical history form dated 01/18/11. Prefers not to have.      Objective:    Physical Exam Vitals and nursing note reviewed.  Constitutional:      Appearance: Normal appearance.  Cardiovascular:     Rate and Rhythm: Normal rate and regular rhythm.  Pulmonary:     Effort: Pulmonary effort is normal.     Breath sounds: Normal breath sounds.  Musculoskeletal:        General: Normal range of motion.     Right lower leg: No edema.     Left lower leg: No edema.  Skin:    General: Skin is warm and dry.  Neurological:     Mental Status: She is alert.  Psychiatric:        Mood and Affect: Mood normal.        Behavior: Behavior normal.   BP 133/67 (BP Location: Left Arm, Patient Position: Sitting, Cuff Size: Large)   Pulse 89   Temp 97.8 F (36.6 C) (Temporal)   Ht 5\' 6"  (1.676 m)   Wt 173 lb 3.2 oz (78.6 kg)   LMP 10/12/2012   SpO2 97%   BMI 27.96 kg/m  Wt Readings from Last 3 Encounters:  06/21/23 173 lb 3.2 oz (78.6 kg)  05/21/23 170 lb (77.1 kg)  04/18/23 174 lb (78.9 kg)      Dulce Sellar, NP

## 2023-06-22 ENCOUNTER — Encounter: Payer: Self-pay | Admitting: Family

## 2023-06-22 DIAGNOSIS — E890 Postprocedural hypothyroidism: Secondary | ICD-10-CM

## 2023-06-22 LAB — HIV ANTIBODY (ROUTINE TESTING W REFLEX): HIV 1&2 Ab, 4th Generation: NONREACTIVE

## 2023-06-22 LAB — HEPATITIS C ANTIBODY: Hepatitis C Ab: NONREACTIVE

## 2023-06-23 NOTE — Progress Notes (Signed)
please ask lab if they can call [facility] and add a free T4 ASAP Monday am! Thanks

## 2023-06-25 NOTE — Procedures (Signed)
Lumbar Facet Joint Nerve Denervation  Patient: Alyssa Fisher      Date of Birth: 01-11-1966 MRN: [id number] PCP: Dulce Sellar, NP      Visit Date: 06/13/2023   Universal Protocol:    Date/Time: 10/21/248:07 PM  Consent Given By: the patient  Position: PRONE  Additional Comments: Vital signs were monitored before and after the procedure. Patient was prepped and draped in the usual sterile fashion. The correct patient, procedure, and site was verified.   Injection Procedure Details:   Procedure diagnoses:  1. Spondylosis without myelopathy or radiculopathy, lumbar region      Meds Administered:  Meds ordered this encounter  Medications   DISCONTD: methylPREDNISolone acetate (DEPO-MEDROL) injection 40 mg     Laterality: Left  Location/Site:  L4-L5, L3 and L4 medial branches and L5-S1, L4 medial branch and L5 dorsal ramus  Needle: 18 ga.,  10mm active tip, RF Cannula  Needle Placement: Along juncture of superior articular process and transverse pocess  Findings:  -Comments:  Procedure Details: For each desired target nerve, the corresponding transverse process (sacral ala for the L5 dorsal rami) was identified and the fluoroscope was positioned to square off the endplates of the corresponding vertebral body to achieve a true AP midline view.  The beam was then obliqued 15 to 20 degrees and caudally tilted 15 to 20 degrees to line up a trajectory along the target nerves. The skin over the target of the junction of superior articulating process and transverse process (sacral ala for the L5 dorsal rami) was infiltrated with 1ml of 1% Lidocaine without Epinephrine.  The 18 gauge 10mm active tip outer cannula was advanced in trajectory view to the target.  This procedure was repeated for each target nerve.  Then, for all levels, the outer cannula placement was fine-tuned and the position was then confirmed with bi-planar imaging.    Test stimulation was done both at  sensory and motor levels to ensure there was no radicular stimulation. The target tissues were then infiltrated with 1 ml of 1% Lidocaine without Epinephrine. Subsequently, a percutaneous neurotomy was carried out for 90 seconds at 80 degrees Celsius.  After the completion of the lesion, 1 ml of injectate was delivered. It was then repeated for each facet joint nerve mentioned above. Appropriate radiographs were obtained to verify the probe placement during the neurotomy.   Additional Comments:  The patient tolerated the procedure well Dressing: 2 x 2 sterile gauze and Band-Aid    Post-procedure details: Patient was observed during the procedure. Post-procedure instructions were reviewed.  Patient left the clinic in stable condition.

## 2023-06-25 NOTE — Progress Notes (Signed)
Alyssa Fisher - 57 y.o. female MRN [id number]  Date of birth: [birth year]  Office Visit Note: Visit Date: 06/13/2023 PCP: Dulce Sellar, NP Referred by: Dulce Sellar, NP  Subjective: Chief Complaint  Patient presents with   Lower Back - Pain   HPI:  Alyssa Fisher is a 57 y.o. female who comes in todayfor planned radiofrequency ablation of the Left L4-5 and L5-S1 Lumbar facet joints. This would be ablation of the corresponding medial branches and/or dorsal rami.  Patient has had double diagnostic blocks with more than 50% relief.  These are documented on pain diary.  They have had chronic back pain for quite some time, more than 3 months, which has been an ongoing situation with recalcitrant axial back pain.  They have no radicular pain.  Their axial pain is worse with standing and ambulating and on exam today with facet loading.  They have had physical therapy as well as home exercise program.  The imaging noted in the chart below indicated facet pathology. Accordingly they meet all the criteria and qualification for for radiofrequency ablation and we are going to complete this today hopefully for more longer term relief as part of comprehensive management program.    ROS Otherwise per HPI.  Assessment & Plan: Visit Diagnoses:    ICD-10-CM   1. Spondylosis without myelopathy or radiculopathy, lumbar region  M47.816 XR C-ARM NO REPORT    Radiofrequency,Lumbar    DISCONTINUED: methylPREDNISolone acetate (DEPO-MEDROL) injection 40 mg      Plan: No additional findings.   Meds & Orders:  Meds ordered this encounter  Medications   DISCONTD: methylPREDNISolone acetate (DEPO-MEDROL) injection 40 mg    Orders Placed This Encounter  Procedures   Radiofrequency,Lumbar   XR C-ARM NO REPORT    Follow-up: Return for visit to requesting provider as needed.   Procedures: No procedures performed  Lumbar Facet Joint Nerve Denervation  Patient: Alyssa Fisher      Date of Birth:  1966-05-21 MRN: [id number] PCP: Dulce Sellar, NP      Visit Date: 06/13/2023   Universal Protocol:    Date/Time: 10/21/248:07 PM  Consent Given By: the patient  Position: PRONE  Additional Comments: Vital signs were monitored before and after the procedure. Patient was prepped and draped in the usual sterile fashion. The correct patient, procedure, and site was verified.   Injection Procedure Details:   Procedure diagnoses:  1. Spondylosis without myelopathy or radiculopathy, lumbar region      Meds Administered:  Meds ordered this encounter  Medications   DISCONTD: methylPREDNISolone acetate (DEPO-MEDROL) injection 40 mg     Laterality: Left  Location/Site:  L4-L5, L3 and L4 medial branches and L5-S1, L4 medial branch and L5 dorsal ramus  Needle: 18 ga.,  10mm active tip, RF Cannula  Needle Placement: Along juncture of superior articular process and transverse pocess  Findings:  -Comments:  Procedure Details: For each desired target nerve, the corresponding transverse process (sacral ala for the L5 dorsal rami) was identified and the fluoroscope was positioned to square off the endplates of the corresponding vertebral body to achieve a true AP midline view.  The beam was then obliqued 15 to 20 degrees and caudally tilted 15 to 20 degrees to line up a trajectory along the target nerves. The skin over the target of the junction of superior articulating process and transverse process (sacral ala for the L5 dorsal rami) was infiltrated with 1ml of 1% Lidocaine without Epinephrine.  The 18  gauge 10mm active tip outer cannula was advanced in trajectory view to the target.  This procedure was repeated for each target nerve.  Then, for all levels, the outer cannula placement was fine-tuned and the position was then confirmed with bi-planar imaging.    Test stimulation was done both at sensory and motor levels to ensure there was no radicular stimulation. The target  tissues were then infiltrated with 1 ml of 1% Lidocaine without Epinephrine. Subsequently, a percutaneous neurotomy was carried out for 90 seconds at 80 degrees Celsius.  After the completion of the lesion, 1 ml of injectate was delivered. It was then repeated for each facet joint nerve mentioned above. Appropriate radiographs were obtained to verify the probe placement during the neurotomy.   Additional Comments:  The patient tolerated the procedure well Dressing: 2 x 2 sterile gauze and Band-Aid    Post-procedure details: Patient was observed during the procedure. Post-procedure instructions were reviewed.  Patient left the clinic in stable condition.      Clinical History: MRI LUMBAR SPINE WITHOUT CONTRAST   TECHNIQUE: Multiplanar, multisequence MR imaging of the lumbar spine was performed. No intravenous contrast was administered.   COMPARISON:  None Available.   FINDINGS: Segmentation:  Standard.   Alignment:  Trace retrolisthesis of L2 over L3.   Vertebrae:  No fracture, evidence of discitis, or bone lesion.   Conus medullaris and cauda equina: Conus extends to the L1-2 level. Conus and cauda equina appear normal.   Paraspinal and other soft tissues: Negative.   Disc levels:   T12-L1: Small left central disc protrusion causing minimal indentation on the thecal sac. No spinal canal or neural foraminal stenosis.   L1-2: Small central disc protrusion causing small indentation of the thecal sac without significant spinal canal or neural foraminal stenosis.   L2-3: Minimal disc bulge. No spinal canal or neural foraminal stenosis.   L3-4: No spinal canal or neural foraminal stenosis.   L4-5: Shallow disc bulge and facet hypertrophy without significant spinal canal or neural foraminal stenosis.   L5-S1: Small central annular tear and facet hypertrophy. No significant spinal canal or neural foraminal stenosis.   IMPRESSION: Mild degenerative disc disease of  the lumbar spine and facet hypertrophy at L4-5 and L5-S1. No high-grade spinal canal or neural foraminal stenosis at any level.     Electronically Signed   By: Baldemar Lenis M.D.   On: 01/31/2022 13:16     Objective:  VS:  HT:    WT:   BMI:     BP:   HR: bpm  TEMP: ( )  RESP:  Physical Exam Vitals and nursing note reviewed.  Constitutional:      General: She is not in acute distress.    Appearance: Normal appearance. She is not ill-appearing.  HENT:     Head: Normocephalic and atraumatic.     Right Ear: External ear normal.     Left Ear: External ear normal.  Eyes:     Extraocular Movements: Extraocular movements intact.  Cardiovascular:     Rate and Rhythm: Normal rate.     Pulses: Normal pulses.  Pulmonary:     Effort: Pulmonary effort is normal. No respiratory distress.  Abdominal:     General: There is no distension.     Palpations: Abdomen is soft.  Musculoskeletal:        General: Tenderness present.     Cervical back: Neck supple.     Right lower leg: No edema.  Left lower leg: No edema.     Comments: Patient has good distal strength with no pain over the greater trochanters.  No clonus or focal weakness.  Skin:    Findings: No erythema, lesion or rash.  Neurological:     General: No focal deficit present.     Mental Status: She is alert and oriented to person, place, and time.     Sensory: No sensory deficit.     Motor: No weakness or abnormal muscle tone.     Coordination: Coordination normal.  Psychiatric:        Mood and Affect: Mood normal.        Behavior: Behavior normal.      Imaging: No results found.

## 2023-06-27 ENCOUNTER — Encounter (INDEPENDENT_AMBULATORY_CARE_PROVIDER_SITE_OTHER): Payer: Self-pay | Admitting: Otolaryngology

## 2023-07-02 ENCOUNTER — Other Ambulatory Visit: Payer: Self-pay

## 2023-07-02 DIAGNOSIS — E890 Postprocedural hypothyroidism: Secondary | ICD-10-CM

## 2023-07-04 ENCOUNTER — Other Ambulatory Visit (INDEPENDENT_AMBULATORY_CARE_PROVIDER_SITE_OTHER): Payer: 59

## 2023-07-04 DIAGNOSIS — E890 Postprocedural hypothyroidism: Secondary | ICD-10-CM | POA: Diagnosis not present

## 2023-07-04 LAB — TSH: TSH: 0.01 u[IU]/mL — ABNORMAL LOW (ref 0.35–5.50)

## 2023-07-04 LAB — T4, FREE: Free T4: 1.33 ng/dL (ref 0.60–1.60)

## 2023-07-09 ENCOUNTER — Encounter: Payer: Self-pay | Admitting: "Endocrinology

## 2023-07-09 ENCOUNTER — Ambulatory Visit (INDEPENDENT_AMBULATORY_CARE_PROVIDER_SITE_OTHER): Payer: 59 | Admitting: "Endocrinology

## 2023-07-09 VITALS — BP 115/72 | HR 95 | Ht 66.0 in | Wt 179.8 lb

## 2023-07-09 DIAGNOSIS — E890 Postprocedural hypothyroidism: Secondary | ICD-10-CM | POA: Diagnosis not present

## 2023-07-09 MED ORDER — LEVOTHYROXINE SODIUM 137 MCG PO TABS: 137.0000 ug | ORAL_TABLET | Freq: Every day | ORAL | 1 refills | Status: DC

## 2023-07-09 NOTE — Progress Notes (Signed)
Outpatient Endocrinology Note Alyssa [place]bury, MD  07/09/23   Alyssa Fisher Feb 14, 1966 [id number]  Referring Provider: Dulce Sellar, NP Primary Care Provider: Dulce Sellar, NP Subjective  No chief complaint on file.   Assessment & Plan  Diagnoses and all orders for this visit:  Hypothyroidism, postsurgical -     TSH Rfx on Abnormal to Free T4; Future  Other orders -     levothyroxine (SYNTHROID) 137 MCG tablet; Take 1 tablet (137 mcg total) by mouth daily before breakfast.    Alyssa Fisher is currently taking levothyroxine 75 mcg 2 pills every morning, takes appropriately. Patient is currently biochemically hyperthyroid.  Educated on thyroid axis.  Recommend the following: Take levothyroxine 137 mcg every morning.  Advised to take levothyroxine first thing in the morning on empty stomach and wait at least 30 minutes to 1 hour before eating or drinking anything or taking any other medications. Space out levothyroxine by 4 hours from any acid reflux medication/fibrate/iron/calcium/multivitamin. Advised to take birth control pills and nutritional supplements in the evening. Repeat lab before next visit or sooner if symptoms of hyperthyroidism or hypothyroidism develop.  Notify us immediately in case of significant weight gain or loss. Counseled on compliance and follow up needs.  I have reviewed current medications, nurse's notes, allergies, vital signs, past medical and surgical history, family medical history, and social history for this encounter. Counseled patient on symptoms, examination findings, lab findings, imaging results, treatment decisions and monitoring and prognosis. The patient understood the recommendations and agrees with the treatment plan. All questions regarding treatment plan were fully answered.  Return in about 3 months (around 10/09/2023) for visit, labs before next visit.   Alyssa [place], MD  07/09/23   I have reviewed current  medications, nurse's notes, allergies, vital signs, past medical and surgical history, family medical history, and social history for this encounter. Counseled patient on symptoms, examination findings, lab findings, imaging results, treatment decisions and monitoring and prognosis. The patient understood the recommendations and agrees with the treatment plan. All questions regarding treatment plan were fully answered.   History of Present Illness Alyssa Fisher is a 57 y.o. year old female who presents to our clinic with postsurgical diagnosed in 2014.    C/o mental fog, hair loss, moodiness/emotions, issues with sleep and appetite Varies between constipation/hyperdefecation  On levothyroxine 75 mcg 2 pills every morning, takes appropriately   Compressive symptoms:  dysphagia  No dysphonia  No positional dyspnea (especially with simultaneous arms elevation)  No  Smokes  No On biotin  No Personal history of head/neck surgery/irradiation  Yes, total thyroidectomy in 2014 for thyroid nodules, no cancer per pt, unable to find final pathology report   Physical Exam  BP 115/72   Pulse 95   Ht 5\' 6"  (1.676 m)   Wt 179 lb 12.8 oz (81.6 kg)   LMP 10/12/2012   SpO2 98%   BMI 29.02 kg/m  Constitutional: well developed, well nourished Head: normocephalic, atraumatic, no exophthalmos Eyes: sclera anicteric, no redness Neck: thyroidectomy scar Lungs: normal respiratory effort Neurology: alert and oriented, no fine hand tremor Skin: dry, no appreciable rashes Musculoskeletal: no appreciable defects Psychiatric: normal mood and affect  Allergies Allergies  Allergen Reactions   Augmentin [Amoxicillin-Pot Clavulanate] Diarrhea   Codeine     Patient stated "possible" due to mother and grandparents being allergic as well. Per medical history form dated 01/18/11. Prefers not to have.    Current Medications Patient's Medications  New  Prescriptions   LEVOTHYROXINE (SYNTHROID) 137 MCG TABLET     Take 1 tablet (137 mcg total) by mouth daily before breakfast.  Previous Medications   ALPRAZOLAM (XANAX) 0.25 MG TABLET    Take 1 tablet (0.25 mg total) by mouth daily as needed for anxiety.   CETIRIZINE (ZYRTEC) 10 MG TABLET    Take 10 mg by mouth daily.   DIAZEPAM (VALIUM) 5 MG TABLET    Take 1 by mouth 1 hour  pre-procedure with very light food. May bring 2nd tablet to appointment.   DILT-XR 120 MG 24 HR CAPSULE    TAKE 1 CAPSULE BY MOUTH EVERY DAY   ESCITALOPRAM (LEXAPRO) 10 MG TABLET    TAKE 2 TABLETS BY MOUTH EVERY DAY   MULTIPLE VITAMIN (MULTIVITAMIN ADULT PO)    Take by mouth.   OMEPRAZOLE (PRILOSEC) 20 MG CAPSULE    TAKE 1 CAPSULE BY MOUTH EVERY DAY   SEMAGLUTIDE,0.25 OR 0.5MG /DOS, (OZEMPIC, 0.25 OR 0.5 MG/DOSE,) 2 MG/3ML SOPN    Inject 0.5 mg into the skin once a week.  Modified Medications   No medications on file  Discontinued Medications   LEVOTHYROXINE (SYNTHROID) 75 MCG TABLET    Take 1 tablet (75 mcg total) by mouth daily before breakfast.    Past Medical History Past Medical History:  Diagnosis Date   Anxiety    Broken ankle    Chronic left-sided lumbar radiculopathy 01/05/2022   Contraceptive management 11/21/2012   Depression    Dizziness - light-headed/fainting 02/20/2011   Dysphagia 10/23/2012   due to thyroid issues   Dysphagia 03/01/2011   Fatigue/Loss of Sleep 02/20/2011   FHx: allergies 02/20/2011   Ask patient to clarify. Only had "allergies" noted. Need more detail per medical history form dated 01/18/11.   Generalized headaches 02/20/2011   GERD (gastroesophageal reflux disease)    History of tonsillectomy    Hoarseness/sore throat 02/20/2011   Hypertension    Miscarriage    One - date not on medical form.   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   Rosacea     Runny nose 02/20/2011   Seasonal allergies    uses albuterol inhaler as needed for allergies   Sinus problem 02/20/2011   Thyroid goiter 10/23/2012   multinodular- has been followed x 69yrs   Wears glasses/contacts 02/20/2011    Past Surgical History Past Surgical History:  Procedure Laterality Date   ABDOMINAL HYSTERECTOMY     ANTERIOR AND POSTERIOR REPAIR N/A 02/21/2017   Procedure: ANTERIOR (CYSTOCELE) AND POSTERIOR REPAIR (RECTOCELE);  Surgeon: Lavina Hamman, MD;  Location: [facility];  Service: Gynecology;  Laterality: N/A;   BILATERAL SALPINGECTOMY N/A 02/21/2017   Procedure: BILATERAL SALPINGECTOMY;  Surgeon: Lavina Hamman, MD;  Location: [facility];  Service: Gynecology;  Laterality: N/A;   BLADDER SUSPENSION N/A 02/21/2017   Procedure: Solyx Sling;  Surgeon: Lavina Hamman, MD;  Location: [facility];  Service: Gynecology;  Laterality: N/A;   broken ankle Right    Pins/rod inserted-retained   COLONOSCOPY     DILATION AND CURETTAGE OF UTERUS     MAB   FRACTURE SURGERY     ORIF ANKLE FRACTURE Right 12/19/2018   Procedure: Removal of hardware and open reduction internal fixation of right ankle bimalleolar fracture;  Surgeon: Toni Arthurs, MD;  Location: [facility];  Service: Orthopedics;  Laterality: Right;  ok to open room per Tam[facility] at [facility]   THYROIDECTOMY N/A 10/29/2012   Procedure: THYROIDECTOMY;  Surgeon: Velora Heckler, MD;  Location: [facility];  Service: General;  Laterality: N/A;  Total Thyroidectomy   TONSILLECTOMY     VAGINAL HYSTERECTOMY Bilateral 02/21/2017   Procedure: HYSTERECTOMY VAGINAL;  Surgeon: Lavina Hamman, MD;  Location: [facility];  Service: Gynecology;  Laterality: Bilateral;   WISDOM TOOTH EXTRACTION      Family History family history includes Alcohol abuse in her father; Arthritis in her mother; Cancer in her mother; Colon polyps in her maternal grandmother; Heart disease in her father and mother; Hypertension in her mother; Kidney  disease in her mother; Obesity in her mother; Vision loss in her father.  Social History Social History   Socioeconomic History   Marital status: Divorced    Spouse name: Not on file   Number of children: 5   Years of education: Not on file   Highest education level: Not on file  Occupational History   Not on file  Tobacco Use   Smoking status: Never   Smokeless tobacco: Never  Vaping Use   Vaping status: Never Used  Substance and Sexual Activity   Alcohol use: Not Currently    Comment: rare -social   Drug use: No   Sexual activity: Not Currently    Birth control/protection: Abstinence, Condom, Post-menopausal  Other Topics Concern   Not on file  Social History Narrative   Not on file   Social Determinants of Health   Financial Resource Strain: Low Risk  (05/21/2023)   Overall Financial Resource Strain (CARDIA)    Difficulty of Paying Living Expenses: Not hard at all  Food Insecurity: No Food Insecurity (05/21/2023)   Hunger Vital Sign    Worried About Running Out of Food in the Last Year: Never true    Ran Out of Food in the Last Year: Never true  Transportation Needs: No Transportation Needs (05/21/2023)   PRAPARE - Administrator, Civil Service (Medical): No    Lack of Transportation (Non-Medical): No  Physical Activity: Insufficiently Active (05/21/2023)   Exercise Vital Sign    Days of Exercise per Week: 3 days    Minutes of Exercise per Session: 30 min  Stress: No Stress Concern Present (05/21/2023)   [employer] of Occupational Health - Occupational Stress Questionnaire    Feeling of Stress : Not at all  Social Connections: Moderately Integrated (05/21/2023)   Social Connection and Isolation Panel [NHANES]    Frequency of Communication with Friends and Family: More than three times a week    Frequency of Social Gatherings with Friends and Family: More than three times a week    Attends Religious Services: More than 4 times per year    Active  Member of [employer] or Organizations: Yes    Attends Banker Meetings: 1 to 4 times per year    Marital Status: Divorced  Intimate Partner Violence: Not At Risk (05/21/2023)   Humiliation, Afraid, Rape, and Kick questionnaire    Fear of Current or Ex-Partner: No    Emotionally Abused: No    Physically Abused: No    Sexually Abused: No    Laboratory Investigations Lab Results  Component Value Date   TSH 0.01 Repeated and verified X2. (L) 07/04/2023   TSH 0.05 (L) 06/21/2023   TSH 0.36 05/02/2022   FREET4 1.33 07/04/2023   FREET4  1.19 05/02/2022   FREET4 1.26 01/05/2022     No results found for: "TSI"   No components found for: "TRAB"   Lab Results  Component Value Date   CHOL 197 05/02/2022   Lab Results  Component Value Date   HDL 49.80 05/02/2022   Lab Results  Component Value Date   LDLCALC 123 (H) 05/02/2022   Lab Results  Component Value Date   TRIG 122.0 05/02/2022   Lab Results  Component Value Date   CHOLHDL 4 05/02/2022   Lab Results  Component Value Date   CREATININE 0.73 10/30/2012   No results found for: "GFR"    Component Value Date/Time   NA 134 (L) 10/30/2012 0426   K 3.9 10/30/2012 0426   CL 100 10/30/2012 0426   CO2 26 10/30/2012 0426   GLUCOSE 123 (H) 10/30/2012 0426   BUN 7 10/30/2012 0426   CREATININE 0.73 10/30/2012 0426   CALCIUM 8.2 (L) 10/30/2012 0426   GFRNONAA >90 10/30/2012 0426   GFRAA >90 10/30/2012 0426      Latest Ref Rng & Units 10/30/2012    4:26 AM  BMP  Glucose 70 - 99 mg/dL 161   BUN 6 - 23 mg/dL 7   Creatinine 0.96 - 0.45 mg/dL 4.09   Sodium 811 - 914 mEq/L 134   Potassium 3.5 - 5.1 mEq/L 3.9   Chloride 96 - 112 mEq/L 100   CO2 19 - 32 mEq/L 26   Calcium 8.4 - 10.5 mg/dL 8.2        Component Value Date/Time   WBC 10.4 02/22/2017 0630   RBC 3.62 (L) 02/22/2017 0630   HGB 10.2 (L) 02/22/2017 0630   HCT 30.6 (L) 02/22/2017 0630   PLT 214 02/22/2017 0630   MCV 84.5 02/22/2017 0630   MCH 28.2  02/22/2017 0630   MCHC 33.3 02/22/2017 0630   RDW 14.0 02/22/2017 0630   LYMPHSABS 2.6 02/13/2017 1345   MONOABS 0.2 02/13/2017 1345   EOSABS 0.2 02/13/2017 1345   BASOSABS 0.0 02/13/2017 1345      Parts of this note may have been dictated using voice recognition software. There may be variances in spelling and vocabulary which are unintentional. Not all errors are proofread. Please notify the Thereasa Parkin if any discrepancies are noted or if the meaning of any statement is not clear.

## 2023-07-11 ENCOUNTER — Encounter: Payer: Self-pay | Admitting: Family

## 2023-07-11 NOTE — Telephone Encounter (Signed)
needs visit, can schedule virtual, thx

## 2023-07-13 ENCOUNTER — Other Ambulatory Visit: Payer: Self-pay | Admitting: Medical Genetics

## 2023-07-13 DIAGNOSIS — Z006 Encounter for examination for normal comparison and control in clinical research program: Secondary | ICD-10-CM

## 2023-07-19 ENCOUNTER — Other Ambulatory Visit: Payer: Self-pay | Admitting: Family

## 2023-07-19 DIAGNOSIS — I10 Essential (primary) hypertension: Secondary | ICD-10-CM

## 2023-07-20 ENCOUNTER — Ambulatory Visit (INDEPENDENT_AMBULATORY_CARE_PROVIDER_SITE_OTHER): Payer: 59 | Admitting: Otolaryngology

## 2023-07-20 ENCOUNTER — Encounter (INDEPENDENT_AMBULATORY_CARE_PROVIDER_SITE_OTHER): Payer: Self-pay

## 2023-07-20 VITALS — Ht 65.0 in | Wt 175.0 lb

## 2023-07-20 DIAGNOSIS — J34829 Nasal valve collapse, unspecified: Secondary | ICD-10-CM | POA: Diagnosis not present

## 2023-07-20 DIAGNOSIS — J343 Hypertrophy of nasal turbinates: Secondary | ICD-10-CM

## 2023-07-20 DIAGNOSIS — J328 Other chronic sinusitis: Secondary | ICD-10-CM

## 2023-07-20 DIAGNOSIS — J342 Deviated nasal septum: Secondary | ICD-10-CM

## 2023-07-20 MED ORDER — AZELASTINE HCL 0.1 % NA SOLN: 2.0000 | Freq: Two times a day (BID) | NASAL | 5 refills | Status: DC

## 2023-07-20 MED ORDER — FLUTICASONE PROPIONATE 50 MCG/ACT NA SUSP: 2.0000 | Freq: Every day | NASAL | 6 refills | Status: DC

## 2023-07-20 MED ORDER — DOXYCYCLINE HYCLATE 100 MG PO TABS: 100.0000 mg | ORAL_TABLET | Freq: Two times a day (BID) | ORAL | 0 refills | Status: AC

## 2023-07-20 MED ORDER — PREDNISONE 10 MG PO TABS: 10.0000 mg | ORAL_TABLET | Freq: Every day | ORAL | 0 refills | Status: DC

## 2023-07-20 NOTE — Progress Notes (Signed)
Dear Dr. Andree Coss, Here is my assessment for our mutual patient, Alyssa Fisher. Thank you for allowing me the opportunity to care for your patient. Please do not hesitate to contact me should you have any other questions. Sincerely, Dr. Jovita Kussmaul  Otolaryngology Clinic Note  HISTORY: Alyssa Fisher is a 57 y.o. female  kindly referred by Dr. Andree Coss for evaluation of septal deviation and nasal obstruction.  Patient reports that she knows that she has a deviated septum on left. Feels like her left nostril feels snug and collapses. She also has some drainage and crusting intermittently. She feels like she gets left sided congestion, some frontal pressure and some maxillary sinus pressure as well. Sense of smell is ok. No discolored drainage. Some post nasal drainage as well. No drainage with valsalva. She does antibiotics for them, once in 2024, and at least twice in 2023. Antibiotics do seem to help some, but continues to have drainage and some pressure. She feels like glasses also put too much pressure on her nose. She is interested in surgical correction.  She has tried astepro, and flonase. Nothing works. Never rinsed.   She reports that she broke her nose when she was pregnant, and then broke it again two summers ago (hit her nose on a desk and ran into a wall).   Allergy testing has been done (late 2010s) - pecan trees, [place] grasses, [place] trees. She did immunotherapy, helped some but not enough (for two years). No previous sinonasal surgery.  GLP-1: Ozempic AP/AC: None  PMHx: Hypothyroidism, MVP, Vision loss (hereditary - retinitis pigmentosa), Anxiety, Migraine H&N surgery: Thyroidectomy (2014), Tonsillectomy No strokes, heart attacks  RADIOGRAPHIC EVALUATION AND INDEPENDENT REVIEW OF OTHER RECORDS:: Allergy (Dr. Irena Cords): multiple visits, reviewed PCP: Sinusitis sx 2024, at least 2x 2023 - antibiotics prescribed No CTs  Past Medical History:  Diagnosis Date   Anxiety     Broken ankle    Chronic left-sided lumbar radiculopathy 01/05/2022   Contraceptive management 11/21/2012   Depression    Dizziness - light-headed/fainting 02/20/2011   Dysphagia 10/23/2012   due to thyroid issues   Dysphagia 03/01/2011   Fatigue/Loss of Sleep 02/20/2011   FHx: allergies 02/20/2011   Ask patient to clarify. Only had "allergies" noted. Need more detail per medical history form dated 01/18/11.   Generalized headaches 02/20/2011   GERD (gastroesophageal reflux disease)    History of tonsillectomy    Hoarseness/sore throat 02/20/2011   Hypertension    Miscarriage    One - date not on medical form.   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   Rosacea    Runny nose 02/20/2011   Seasonal allergies    uses albuterol inhaler as needed for allergies   Sinus problem 02/20/2011   Thyroid goiter 10/23/2012   multinodular- has been followed x 83yrs   Wears glasses/contacts 02/20/2011   Past Surgical History:  Procedure Laterality Date   ABDOMINAL HYSTERECTOMY     ANTERIOR AND POSTERIOR REPAIR N/A 02/21/2017   Procedure: ANTERIOR (CYSTOCELE) AND POSTERIOR REPAIR (RECTOCELE);  Surgeon: Lavina Hamman, MD;  Location: [facility];  Service: Gynecology;  Laterality: N/A;   BILATERAL SALPINGECTOMY N/A 02/21/2017   Procedure: BILATERAL SALPINGECTOMY;  Surgeon: Lavina Hamman, MD;  Location: [facility];  Service: Gynecology;  Laterality: N/A;   BLADDER SUSPENSION N/A  02/21/2017   Procedure: Solyx Sling;  Surgeon: Lavina Hamman, MD;  Location: [facility];  Service: Gynecology;  Laterality: N/A;   broken ankle Right    Pins/rod inserted-retained   COLONOSCOPY     DILATION AND CURETTAGE OF UTERUS     MAB   FRACTURE SURGERY     ORIF ANKLE FRACTURE Right 12/19/2018   Procedure: Removal of hardware and  open reduction internal fixation of right ankle bimalleolar fracture;  Surgeon: Toni Arthurs, MD;  Location: [facility];  Service: Orthopedics;  Laterality: Right;  ok to open room per [facility] at [facility]   THYROIDECTOMY N/A 10/29/2012   Procedure: THYROIDECTOMY;  Surgeon: Velora Heckler, MD;  Location: [facility];  Service: General;  Laterality: N/A;  Total Thyroidectomy   TONSILLECTOMY     VAGINAL HYSTERECTOMY Bilateral 02/21/2017   Procedure: HYSTERECTOMY VAGINAL;  Surgeon: Lavina Hamman, MD;  Location: [facility];  Service: Gynecology;  Laterality: Bilateral;   WISDOM TOOTH EXTRACTION     Family History  Problem Relation Age of Onset   Arthritis Mother    Cancer Mother    Heart disease Mother    Hypertension Mother    Kidney disease Mother    Obesity Mother    Heart disease Father    Alcohol abuse Father    Vision loss Father    Colon polyps Maternal Grandmother    Social History   Tobacco Use   Smoking status: Never   Smokeless tobacco: Never  Substance Use Topics   Alcohol use: Not Currently    Comment: rare -social   Allergies  Allergen Reactions   Augmentin [Amoxicillin-Pot Clavulanate] Diarrhea   Codeine     Patient stated "possible" due to mother and grandparents being allergic as well. Per medical history form dated 01/18/11. Prefers not to have.   Current Outpatient Medications  Medication Sig Dispense Refill   ALPRAZolam (XANAX) 0.25 MG tablet Take 1 tablet (0.25 mg total) by mouth daily as needed for anxiety.     azelastine (ASTELIN) 0.1 % nasal spray Place 2 sprays into both nostrils 2 (two) times daily. Use in each nostril as directed 30 mL 5   cetirizine (ZYRTEC) 10 MG tablet Take 10 mg by mouth daily.     DILT-XR 120 MG 24 hr capsule TAKE 1 CAPSULE BY MOUTH EVERY DAY 90 capsule 1   doxycycline (VIBRA-TABS) 100 MG tablet Take 1 tablet (100 mg total) by mouth 2 (two) times daily for 12 days. 24 tablet 0   escitalopram (LEXAPRO) 10 MG tablet TAKE  2 TABLETS BY MOUTH EVERY DAY 180 tablet 1   fluticasone (FLONASE) 50 MCG/ACT nasal spray Place 2 sprays into both nostrils daily. 11 mL 6   levothyroxine (SYNTHROID) 137 MCG tablet Take 1 tablet (137 mcg total) by mouth daily before breakfast. 90 tablet 1   Multiple Vitamin (MULTIVITAMIN ADULT PO) Take by mouth.     omeprazole (PRILOSEC) 20 MG capsule TAKE 1 CAPSULE BY MOUTH EVERY DAY 90 capsule 0   predniSONE (DELTASONE) 10 MG tablet Take 1 tablet (10 mg total) by mouth daily with breakfast. Take Prednisone by mouth (PO) 30mg  x 4 days (3 pills in morning), then 20mg  x4 days (2 pills), then 10mg  x 4 days (1 pill), then stop. Risks discussed 24 tablet 0   Semaglutide,0.25 or 0.5MG /DOS, (OZEMPIC, 0.25 OR 0.5 MG/DOSE,) 2 MG/3ML SOPN Inject 0.5 mg into the skin once a week. 3 mL 2   diazepam (VALIUM) 5 MG tablet  Take 1 by mouth 1 hour  pre-procedure with very light food. May bring 2nd tablet to appointment. (Patient not taking: Reported on 07/09/2023) 2 tablet 0   No current facility-administered medications for this visit.   Ht 5\' 5"  (1.651 m)   Wt 175 lb (79.4 kg)   LMP 10/12/2012   BMI 29.12 kg/m   PHYSICAL EXAM:  Ht 5\' 5"  (1.651 m)   Wt 175 lb (79.4 kg)   LMP 10/12/2012   BMI 29.12 kg/m   Salient findings:  CN II-XII intact - vision diminished bilateral 2/2 retitinis pigmentose  Bilateral EAC clear and TM intact with well pneumatized middle ear spaces Nose: Anterior rhinoscopy reveals left septal deviation and bilateral inferior turbinate hypertrophy. Skin is modestly thin, slight nasal bone deviation left, middle third ULC collapse more so on right, some tension nose features. 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 Nasal endoscopy was indicated to better evaluate the nose and paranasal sinuses, given the patient's history and exam findings, and is detailed below. No lesions of oral cavity/oropharynx; dentition fair No obviously palpable neck masses/lymphadenopathy/thyromegaly No  respiratory distress or stridor   PROCEDURE: Diagnostic Nasal Endoscopy Pre-procedure diagnosis: Concern for chronic sinusitis Post-procedure diagnosis: same Indication: See pre-procedure diagnosis and physical exam above Complications: None apparent EBL: 0 mL Anesthesia: Lidocaine 4% and topical decongestant was topically sprayed in each nasal cavity  Description of Procedure:  Patient was identified. A rigid 30 degree endoscope was utilized to evaluate the sinonasal cavities, mucosa, sinus ostia and turbinates and septum.  Overall, minimal signs of mucosal inflammation are noted.  Also noted are left septal deviation, spur posteriorly on left as well with more caudal left deviation.  No mucopurulence, polyps, or masses noted.   Right Middle meatus: clear Right SE Recess: clear Left MM: clear Left SE Recess: clear  CPT CODE -- 31231 - Mod 25   ASSESSMENT:   57 yo F with history of nasal trauma now with: 1. Other chronic sinusitis   2. Nasal septal deviation   3. Nasal turbinate hypertrophy   4. Nasal valve collapse    She clearly has some left sided chronic sinusitis symptoms with some pressure and discomfort with improvement on abx/steroids. 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 We discussed that she has about a 80-85% improvement chance with septoplasty/turbs along for improvement, and likely higher with nasal valve reconstruction. This is a structural problem, however, and improving nasal breathing for nasal valve collapse entails making the nose slightly wider (middle third) with spreader grafting. She understands this. She will think about if she wants to do just septoplasty/turbs or valve repair as well  In meantime, we will treat her maximally medically and get a post-treatment sinus CT to address her sinusitis  complaints.  She agrees and is anxious to proceed  We've discussed issues and options today.  We reviewed the nasal endoscopy images together.  The risks, benefits and alternatives were discussed and questions answered.  She has elected to proceed with: Take doxycycline BID x12d Take Prednisone by mouth (PO) 30mg  x 4 days (3 pills in morning), then 20mg  x4 days (2 pills), then 10mg  x 4 days (1 pill), then stop. Risks discussed Flonase BID as below Astelin BID each nostril until follow up as below Daily neilmed sinus rinses CT Sinus [facility] for pre-op scan F/u in 5-6  weeks  See below regarding exact medications prescribed this encounter including dosages and route: Meds ordered this encounter  Medications   doxycycline (VIBRA-TABS) 100 MG tablet    Sig: Take 1 tablet (100 mg total) by mouth 2 (two) times daily for 12 days.    Dispense:  24 tablet    Refill:  0   predniSONE (DELTASONE) 10 MG tablet    Sig: Take 1 tablet (10 mg total) by mouth daily with breakfast. Take Prednisone by mouth (PO) 30mg  x 4 days (3 pills in morning), then 20mg  x4 days (2 pills), then 10mg  x 4 days (1 pill), then stop. Risks discussed    Dispense:  24 tablet    Refill:  0   azelastine (ASTELIN) 0.1 % nasal spray    Sig: Place 2 sprays into both nostrils 2 (two) times daily. Use in each nostril as directed    Dispense:  30 mL    Refill:  5   fluticasone (FLONASE) 50 MCG/ACT nasal spray    Sig: Place 2 sprays into both nostrils daily.    Dispense:  11 mL    Refill:  6     Thank you for allowing me the opportunity to care for your patient. Please do not hesitate to contact me should you have any other questions.  Sincerely, Jovita Kussmaul, MD Otolarynoglogist (ENT), [facility] Phone: [phone] Fax: [phone]  07/20/2023, 4:44 PM   I have personally spent 65 minutes involved in face-to-face and non-face-to-face activities for this patient on the day of the visit.  Professional  time spent includes the following activities, in addition to those noted in the documentation: preparing to see the patient (review of outside documentation), performing a medically appropriate examination and/or evaluation, counseling and educating the patient/family/caregiver, ordering medications, performing procedures (endoscopy), referring and communicating with other healthcare professionals, documenting clinical information in the electronic or other health record, independently interpreting results and communicating results with the patient - extensive counseling

## 2023-07-20 NOTE — Patient Instructions (Signed)
Take Doxycycline BID by mouth (PO) twice daily for 12 days; take with food, take probiotic or yogurt with it Take Prednisone by mouth (PO) 30mg  x 4 days (3 pills in morning), then 20mg  x4 days (2 pills), then 10mg  x 4 days (1 pill), then stop. Risks discussed Take astelin twice daily (two puffs each nostril) Take flonase twice daily (two puffs each nostril) I have ordered an imaging study for you to complete prior to your next visit. Please call [facility] at [phone] to schedule your imaging if you have not received a call within 24 hours. If you are unable to complete your imaging study prior to your next scheduled visit please call our office to let us know --- do it in 4 weeks

## 2023-07-31 ENCOUNTER — Other Ambulatory Visit
Admission: RE | Admit: 2023-07-31 | Discharge: 2023-07-31 | Disposition: A | Discharge status: Home / Self Care | Payer: 59 | Source: Ambulatory Visit | Attending: Medical Genetics | Admitting: Medical Genetics

## 2023-07-31 DIAGNOSIS — Z006 Encounter for examination for normal comparison and control in clinical research program: Secondary | ICD-10-CM | POA: Insufficient documentation

## 2023-08-08 ENCOUNTER — Other Ambulatory Visit: Payer: Self-pay | Admitting: Family

## 2023-08-08 ENCOUNTER — Ambulatory Visit (HOSPITAL_COMMUNITY)
Admission: RE | Admit: 2023-08-08 | Discharge: 2023-08-08 | Disposition: A | Discharge status: Home / Self Care | Payer: 59 | Source: Ambulatory Visit | Attending: Otolaryngology | Admitting: Otolaryngology

## 2023-08-08 DIAGNOSIS — J342 Deviated nasal septum: Secondary | ICD-10-CM | POA: Insufficient documentation

## 2023-08-08 DIAGNOSIS — J343 Hypertrophy of nasal turbinates: Secondary | ICD-10-CM | POA: Insufficient documentation

## 2023-08-08 DIAGNOSIS — J328 Other chronic sinusitis: Secondary | ICD-10-CM | POA: Diagnosis not present

## 2023-08-08 DIAGNOSIS — J329 Chronic sinusitis, unspecified: Secondary | ICD-10-CM | POA: Diagnosis not present

## 2023-08-13 LAB — GENECONNECT MOLECULAR SCREEN: Genetic Analysis Overall Interpretation: NEGATIVE

## 2023-08-15 ENCOUNTER — Other Ambulatory Visit: Payer: Self-pay | Admitting: Family

## 2023-08-23 ENCOUNTER — Other Ambulatory Visit: Payer: Self-pay

## 2023-08-28 ENCOUNTER — Ambulatory Visit (INDEPENDENT_AMBULATORY_CARE_PROVIDER_SITE_OTHER): Payer: 59 | Admitting: Otolaryngology

## 2023-08-28 ENCOUNTER — Encounter (INDEPENDENT_AMBULATORY_CARE_PROVIDER_SITE_OTHER): Payer: Self-pay

## 2023-08-28 VITALS — Ht 65.0 in | Wt 175.0 lb

## 2023-08-28 DIAGNOSIS — J34829 Nasal valve collapse, unspecified: Secondary | ICD-10-CM | POA: Diagnosis not present

## 2023-08-28 DIAGNOSIS — J343 Hypertrophy of nasal turbinates: Secondary | ICD-10-CM | POA: Diagnosis not present

## 2023-08-28 DIAGNOSIS — M950 Acquired deformity of nose: Secondary | ICD-10-CM | POA: Diagnosis not present

## 2023-08-28 DIAGNOSIS — J342 Deviated nasal septum: Secondary | ICD-10-CM

## 2023-09-02 NOTE — Progress Notes (Unsigned)
Dear Dr. Andree Coss, Here is my assessment for our mutual patient, Alyssa Fisher. Thank you for allowing me the opportunity to care for your patient. Please do not hesitate to contact me should you have any other questions. Sincerely, Dr. Jovita Kussmaul  Otolaryngology Clinic Note  HISTORY: Alyssa Fisher is a 57 y.o. female  kindly referred by Dr. Andree Coss for evaluation of septal deviation and nasal obstruction.  Patient reports that she knows that she has a deviated septum on left. Feels like her left nostril feels snug and collapses. She also has some drainage and crusting intermittently. She feels like she gets left sided congestion, some frontal pressure and some maxillary sinus pressure as well. Sense of smell is ok. No discolored drainage. Some post nasal drainage as well. No drainage with valsalva. She does antibiotics for them, once in 2024, and at least twice in 2023. Antibiotics do seem to help some, but continues to have drainage and some pressure. She feels like glasses also put too much pressure on her nose. She is interested in surgical correction.  She has tried astepro, and flonase. Nothing works. Never rinsed.   She reports that she broke her nose when she was pregnant, and then broke it again two summers ago (hit her nose on a desk and ran into a wall).   Allergy testing has been done (late 2010s) - pecan trees, [place] grasses, [place] trees. She did immunotherapy, helped some but not enough (for two years). No previous sinonasal surgery.  GLP-1: Ozempic AP/AC: None  PMHx: Hypothyroidism, MVP, Vision loss (hereditary - retinitis pigmentosa), Anxiety, Migraine H&N surgery: Thyroidectomy (2014), Tonsillectomy No strokes, heart attacks  RADIOGRAPHIC EVALUATION AND INDEPENDENT REVIEW OF OTHER RECORDS:: Allergy (Dr. Irena Cords): multiple visits, reviewed PCP: Sinusitis sx 2024, at least 2x 2023 - antibiotics prescribed No CTs  Past Medical History:  Diagnosis Date   Anxiety     Broken ankle    Chronic left-sided lumbar radiculopathy 01/05/2022   Contraceptive management 11/21/2012   Depression    Dizziness - light-headed/fainting 02/20/2011   Dysphagia 10/23/2012   due to thyroid issues   Dysphagia 03/01/2011   Fatigue/Loss of Sleep 02/20/2011   FHx: allergies 02/20/2011   Ask patient to clarify. Only had "allergies" noted. Need more detail per medical history form dated 01/18/11.   Generalized headaches 02/20/2011   GERD (gastroesophageal reflux disease)    History of tonsillectomy    Hoarseness/sore throat 02/20/2011   Hypertension    Miscarriage    One - date not on medical form.   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   Rosacea    Runny nose 02/20/2011   Seasonal allergies    uses albuterol inhaler as needed for allergies   Sinus problem 02/20/2011   Thyroid goiter 10/23/2012   multinodular- has been followed x 34yrs   Wears glasses/contacts 02/20/2011   Past Surgical History:  Procedure Laterality Date   ABDOMINAL HYSTERECTOMY     ANTERIOR AND POSTERIOR REPAIR N/A 02/21/2017   Procedure: ANTERIOR (CYSTOCELE) AND POSTERIOR REPAIR (RECTOCELE);  Surgeon: Lavina Hamman, MD;  Location: [facility];  Service: Gynecology;  Laterality: N/A;   BILATERAL SALPINGECTOMY N/A 02/21/2017   Procedure: BILATERAL SALPINGECTOMY;  Surgeon: Lavina Hamman, MD;  Location: [facility];  Service: Gynecology;  Laterality: N/A;   BLADDER SUSPENSION N/A  02/21/2017   Procedure: Solyx Sling;  Surgeon: Lavina Hamman, MD;  Location: [facility];  Service: Gynecology;  Laterality: N/A;   broken ankle Right    Pins/rod inserted-retained   COLONOSCOPY     DILATION AND CURETTAGE OF UTERUS     MAB   FRACTURE SURGERY     ORIF ANKLE FRACTURE Right 12/19/2018   Procedure: Removal of hardware and  open reduction internal fixation of right ankle bimalleolar fracture;  Surgeon: Toni Arthurs, MD;  Location: [facility];  Service: Orthopedics;  Laterality: Right;  ok to open room per [facility] at [facility]   THYROIDECTOMY N/A 10/29/2012   Procedure: THYROIDECTOMY;  Surgeon: Velora Heckler, MD;  Location: [facility];  Service: General;  Laterality: N/A;  Total Thyroidectomy   TONSILLECTOMY     VAGINAL HYSTERECTOMY Bilateral 02/21/2017   Procedure: HYSTERECTOMY VAGINAL;  Surgeon: Lavina Hamman, MD;  Location: [facility];  Service: Gynecology;  Laterality: Bilateral;   WISDOM TOOTH EXTRACTION     Family History  Problem Relation Age of Onset   Arthritis Mother    Cancer Mother    Heart disease Mother    Hypertension Mother    Kidney disease Mother    Obesity Mother    Heart disease Father    Alcohol abuse Father    Vision loss Father    Colon polyps Maternal Grandmother    Social History   Tobacco Use   Smoking status: Never   Smokeless tobacco: Never  Substance Use Topics   Alcohol use: Not Currently    Comment: rare -social   Allergies  Allergen Reactions   Augmentin [Amoxicillin-Pot Clavulanate] Diarrhea   Codeine     Patient stated "possible" due to mother and grandparents being allergic as well. Per medical history form dated 01/18/11. Prefers not to have.   Current Outpatient Medications  Medication Sig Dispense Refill   ALPRAZolam (XANAX) 0.25 MG tablet Take 1 tablet (0.25 mg total) by mouth daily as needed for anxiety.     cetirizine (ZYRTEC) 10 MG tablet Take 10 mg by mouth daily.     DILT-XR 120 MG 24 hr capsule TAKE 1 CAPSULE BY MOUTH EVERY DAY 90 capsule 1   escitalopram (LEXAPRO) 10 MG tablet TAKE 2 TABLETS BY MOUTH EVERY DAY 180 tablet 1   fluticasone (FLONASE) 50 MCG/ACT nasal spray Place 2 sprays into both nostrils daily. 11 mL 6   levothyroxine (SYNTHROID) 137 MCG tablet Take 1 tablet (137 mcg total) by mouth daily before breakfast. 90 tablet 1    levothyroxine (SYNTHROID) 75 MCG tablet TAKE 2 TABLETS BY MOUTH DAILY BEFORE BREAKFAST. 180 tablet 1   Multiple Vitamin (MULTIVITAMIN ADULT PO) Take by mouth.     omeprazole (PRILOSEC) 20 MG capsule TAKE 1 CAPSULE BY MOUTH EVERY DAY 90 capsule 0   predniSONE (DELTASONE) 10 MG tablet Take 1 tablet (10 mg total) by mouth daily with breakfast. Take Prednisone by mouth (PO) 30mg  x 4 days (3 pills in morning), then 20mg  x4 days (2 pills), then 10mg  x 4 days (1 pill), then stop. Risks discussed 24 tablet 0   Semaglutide,0.25 or 0.5MG /DOS, (OZEMPIC, 0.25 OR 0.5 MG/DOSE,) 2 MG/3ML SOPN Inject 0.5 mg into the skin once a week. 3 mL 2   azelastine (ASTELIN) 0.1 % nasal spray Place 2 sprays into both nostrils 2 (two) times daily. Use in each nostril as directed 30 mL 5   diazepam (VALIUM) 5 MG tablet Take 1 by mouth 1 hour  pre-procedure with very light food. May bring 2nd tablet to appointment. (Patient not taking: Reported on 08/28/2023) 2 tablet 0   No current facility-administered medications for this visit.   Ht 5\' 5"  (1.651 m)   Wt 175 lb (79.4 kg)   LMP 10/12/2012   BMI 29.12 kg/m   PHYSICAL EXAM:  Ht 5\' 5"  (1.651 m)   Wt 175 lb (79.4 kg)   LMP 10/12/2012   BMI 29.12 kg/m   Salient findings:  CN II-XII intact - vision diminished bilateral 2/2 retitinis pigmentose  Bilateral EAC clear and TM intact with well pneumatized middle ear spaces Nose: Anterior rhinoscopy reveals left septal deviation and bilateral inferior turbinate hypertrophy. Skin is modestly thin, slight nasal bone deviation left, middle third ULC collapse more so on right, some tension nose features. 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 Nasal endoscopy was indicated to better evaluate the nose and paranasal sinuses, given the patient's history and exam findings, and is detailed below. No lesions of oral cavity/oropharynx; dentition fair No obviously palpable neck masses/lymphadenopathy/thyromegaly No respiratory distress  or stridor   PROCEDURE: Diagnostic Nasal Endoscopy Pre-procedure diagnosis: Concern for chronic sinusitis Post-procedure diagnosis: same Indication: See pre-procedure diagnosis and physical exam above Complications: None apparent EBL: 0 mL Anesthesia: Lidocaine 4% and topical decongestant was topically sprayed in each nasal cavity  Description of Procedure:  Patient was identified. A rigid 30 degree endoscope was utilized to evaluate the sinonasal cavities, mucosa, sinus ostia and turbinates and septum.  Overall, minimal signs of mucosal inflammation are noted.  Also noted are left septal deviation, spur posteriorly on left as well with more caudal left deviation.  No mucopurulence, polyps, or masses noted.   Right Middle meatus: clear Right SE Recess: clear Left MM: clear Left SE Recess: clear  CPT CODE -- 31231 - Mod 25   ASSESSMENT:   57 yo F with history of nasal trauma now with: No diagnosis found.  She clearly has some left sided chronic sinusitis symptoms with some pressure and discomfort with improvement on abx/steroids. 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 We discussed that she has about a 80-85% improvement chance with septoplasty/turbs along for improvement, and likely higher with nasal valve reconstruction. This is a structural problem, however, and improving nasal breathing for nasal valve collapse entails making the nose slightly wider (middle third) with spreader grafting. She understands this. She will think about if she wants to do just septoplasty/turbs or valve repair as well  In meantime, we will treat her maximally medically and get a post-treatment sinus CT to address her sinusitis complaints.  She agrees and is anxious to proceed  We've discussed issues and options today.  We reviewed the nasal  endoscopy images together.  The risks, benefits and alternatives were discussed and questions answered.  She has elected to proceed with: Take doxycycline BID x12d Take Prednisone by mouth (PO) 30mg  x 4 days (3 pills in morning), then 20mg  x4 days (2 pills), then 10mg  x 4 days (1 pill), then stop. Risks discussed Flonase BID as below Astelin BID each nostril until follow up as below Daily neilmed sinus rinses CT Sinus [facility] for pre-op scan F/u in 5-6 weeks  See below regarding exact medications prescribed this encounter including dosages and route: No orders of the defined types were placed in this encounter.  Thank you for allowing me the opportunity to care for your patient. Please do not hesitate to contact me should you have any other questions.  Sincerely, Jovita Kussmaul, MD Otolarynoglogist (ENT), [facility] Phone: [phone] Fax: [phone]  09/02/2023, 3:07 PM   I have personally spent 65 minutes involved in face-to-face and non-face-to-face activities for this patient on the day of the visit.  Professional time spent includes the following activities, in addition to those noted in the documentation: preparing to see the patient (review of outside documentation), performing a medically appropriate examination and/or evaluation, counseling and educating the patient/family/caregiver, ordering medications, performing procedures (endoscopy), referring and communicating with other healthcare professionals, documenting clinical information in the electronic or other health record, independently interpreting results and communicating results with the patient - extensive counseling

## 2023-09-06 ENCOUNTER — Encounter (INDEPENDENT_AMBULATORY_CARE_PROVIDER_SITE_OTHER): Payer: Self-pay

## 2023-09-18 ENCOUNTER — Telehealth (INDEPENDENT_AMBULATORY_CARE_PROVIDER_SITE_OTHER): Payer: Self-pay | Admitting: Otolaryngology

## 2023-09-18 ENCOUNTER — Encounter (INDEPENDENT_AMBULATORY_CARE_PROVIDER_SITE_OTHER): Payer: Self-pay

## 2023-09-18 DIAGNOSIS — M950 Acquired deformity of nose: Secondary | ICD-10-CM

## 2023-09-18 DIAGNOSIS — J328 Other chronic sinusitis: Secondary | ICD-10-CM

## 2023-09-18 DIAGNOSIS — J34829 Nasal valve collapse, unspecified: Secondary | ICD-10-CM

## 2023-09-18 DIAGNOSIS — J343 Hypertrophy of nasal turbinates: Secondary | ICD-10-CM

## 2023-09-18 DIAGNOSIS — J342 Deviated nasal septum: Secondary | ICD-10-CM

## 2023-09-18 NOTE — Telephone Encounter (Signed)
Re-referred.

## 2023-09-19 ENCOUNTER — Encounter (INDEPENDENT_AMBULATORY_CARE_PROVIDER_SITE_OTHER): Payer: Self-pay

## 2023-09-27 ENCOUNTER — Other Ambulatory Visit (HOSPITAL_COMMUNITY): Payer: Self-pay

## 2023-09-27 ENCOUNTER — Telehealth: Payer: Self-pay | Admitting: Pharmacy Technician

## 2023-09-27 NOTE — Telephone Encounter (Signed)
Pharmacy Patient Advocate Encounter   Received notification from CoverMyMeds that prior authorization for Ozempic (0.25 or 0.5 MG/DOSE) 2MG /3ML pen-injectors is required/requested.   Insurance verification completed.   The patient is insured through [facility] .   Per test claim: PA required; PA submitted to above mentioned insurance via CoverMyMeds Key/confirmation #/EOC [id number] Status is pending

## 2023-10-01 ENCOUNTER — Other Ambulatory Visit: Payer: Self-pay

## 2023-10-01 DIAGNOSIS — E890 Postprocedural hypothyroidism: Secondary | ICD-10-CM

## 2023-10-01 NOTE — Telephone Encounter (Signed)
Pharmacy Patient Advocate Encounter  Received notification from [facility] that Prior Authorization for Ozempic (0.25 or 0.5 MG/DOSE) 2MG /3ML pen-injectors has been DENIED.  See denial reason below. No denial letter attached in CMM. Will attach denial letter to Media tab once received.   PA #/Case ID/Reference #: [id number]

## 2023-10-02 ENCOUNTER — Other Ambulatory Visit: Payer: 59

## 2023-10-02 DIAGNOSIS — E890 Postprocedural hypothyroidism: Secondary | ICD-10-CM | POA: Diagnosis not present

## 2023-10-03 LAB — T4, FREE: Free T4: 2.1 ng/dL — ABNORMAL HIGH (ref 0.8–1.8)

## 2023-10-03 LAB — TSH+FREE T4: TSH W/REFLEX TO FT4: 0.01 m[IU]/L — ABNORMAL LOW (ref 0.40–4.50)

## 2023-10-03 NOTE — Telephone Encounter (Signed)
let Alina know, did her insurance change?

## 2023-10-09 ENCOUNTER — Ambulatory Visit (INDEPENDENT_AMBULATORY_CARE_PROVIDER_SITE_OTHER): Payer: 59 | Admitting: "Endocrinology

## 2023-10-09 ENCOUNTER — Encounter: Payer: Self-pay | Admitting: "Endocrinology

## 2023-10-09 VITALS — BP 152/100 | HR 80 | Resp 20 | Ht 65.0 in | Wt 188.6 lb

## 2023-10-09 DIAGNOSIS — E890 Postprocedural hypothyroidism: Secondary | ICD-10-CM | POA: Diagnosis not present

## 2023-10-09 NOTE — Progress Notes (Signed)
 Outpatient Endocrinology Note Alyssa Birmingham, MD  10/09/23   Alyssa Fisher 06-06-1966 [id number]  Referring Provider: Lucius Krabbe, NP Primary Care Provider: Lucius Krabbe, NP Subjective  No chief complaint on file.   Assessment & Plan  Diagnoses and all orders for this visit:  Hypothyroidism, postsurgical -     TSH -     T4, free   Alyssa Fisher is currently taking levothyroxine  137 mcg every morning, takes appropriately. Patient is currently biochemically hyperthyroid.  Educated on thyroid  axis.  Recommend the following: Take levothyroxine  137 mcg SIX days a week. Advised to take levothyroxine  first thing in the morning on empty stomach and wait at least 30 minutes to 1 hour before eating or drinking anything or taking any other medications. Space out levothyroxine  by 4 hours from any acid reflux medication/fibrate/iron/calcium /multivitamin. Advised to take birth control pills and nutritional supplements in the evening. Repeat lab before next visit or sooner if symptoms of hyperthyroidism or hypothyroidism develop.  Notify us  immediately in case of significant weight gain or loss. Counseled on compliance and follow up needs.  Return in about 6 weeks (around 11/20/2023).   Alyssa Birmingham, MD  10/09/23   I have reviewed current medications, nurse's notes, allergies, vital signs, past medical and surgical history, family medical history, and social history for this encounter. Counseled patient on symptoms, examination findings, lab findings, imaging results, treatment decisions and monitoring and prognosis. The patient understood the recommendations and agrees with the treatment plan. All questions regarding treatment plan were fully answered.   History of Present Illness Alyssa Fisher is a 58 y.o. year old female who presents to our clinic with postsurgical diagnosed in 2014.    C/o dry hair with falls, not sleeping well, not feeling good overall Has  hyperdefecation, heat intolerance No weight loss  On levothyroxine  137 mcg every morning, takes appropriately   Compressive symptoms:  dysphagia  No dysphonia  No positional dyspnea (especially with simultaneous arms elevation)  No  Smokes  No On biotin  No Personal history of head/neck surgery/irradiation  Yes, total thyroidectomy in 2014 for thyroid  nodules, no cancer per pt, unable to find final pathology report   Physical Exam  BP (!) 152/100 (BP Location: Left Arm, Patient Position: Sitting, Cuff Size: Normal)   Pulse 80   Resp 20   Ht 5' 5 (1.651 m)   Wt 188 lb 9.6 oz (85.5 kg)   LMP 10/12/2012   SpO2 95%   BMI 31.38 kg/m  Constitutional: well developed, well nourished Head: normocephalic, atraumatic, no exophthalmos Eyes: sclera anicteric, no redness Neck: thyroidectomy scar Lungs: normal respiratory effort Neurology: alert and oriented, no fine hand tremor Skin: dry, no appreciable rashes Musculoskeletal: no appreciable defects Psychiatric: normal mood and affect  Allergies Allergies  Allergen Reactions   Augmentin  [Amoxicillin -Pot Clavulanate] Diarrhea   Codeine     Patient stated possible due to mother and grandparents being allergic as well. Per medical history form dated 01/18/11. Prefers not to have.    Current Medications Patient's Medications  New Prescriptions   No medications on file  Previous Medications   ALPRAZOLAM  (XANAX ) 0.25 MG TABLET    Take 1 tablet (0.25 mg total) by mouth daily as needed for anxiety.   AZELASTINE  (ASTELIN ) 0.1 % NASAL SPRAY    Place 2 sprays into both nostrils 2 (two) times daily. Use in each nostril as directed   CETIRIZINE (ZYRTEC) 10 MG TABLET    Take 10 mg by  mouth daily.   DIAZEPAM  (VALIUM ) 5 MG TABLET    Take 1 by mouth 1 hour  pre-procedure with very light food. May bring 2nd tablet to appointment.   DILT-XR 120 MG 24 HR CAPSULE    TAKE 1 CAPSULE BY MOUTH EVERY DAY   ESCITALOPRAM  (LEXAPRO ) 10 MG TABLET    TAKE 2  TABLETS BY MOUTH EVERY DAY   FLUTICASONE  (FLONASE ) 50 MCG/ACT NASAL SPRAY    Place 2 sprays into both nostrils daily.   LEVOTHYROXINE  (SYNTHROID ) 137 MCG TABLET    Take 1 tablet (137 mcg total) by mouth daily before breakfast.   LEVOTHYROXINE  (SYNTHROID ) 75 MCG TABLET    TAKE 2 TABLETS BY MOUTH DAILY BEFORE BREAKFAST.   MULTIPLE VITAMIN (MULTIVITAMIN ADULT PO)    Take by mouth.   OMEPRAZOLE  (PRILOSEC) 20 MG CAPSULE    TAKE 1 CAPSULE BY MOUTH EVERY DAY   PREDNISONE  (DELTASONE ) 10 MG TABLET    Take 1 tablet (10 mg total) by mouth daily with breakfast. Take Prednisone  by mouth (PO) 30mg  x 4 days (3 pills in morning), then 20mg  x4 days (2 pills), then 10mg  x 4 days (1 pill), then stop. Risks discussed   SEMAGLUTIDE ,0.25 OR 0.5MG /DOS, (OZEMPIC , 0.25 OR 0.5 MG/DOSE,) 2 MG/3ML SOPN    Inject 0.5 mg into the skin once a week.  Modified Medications   No medications on file  Discontinued Medications   No medications on file    Past Medical History Past Medical History:  Diagnosis Date   Anxiety    Broken ankle    Chronic left-sided lumbar radiculopathy 01/05/2022   Contraceptive management 11/21/2012   Depression    Dizziness - light-headed/fainting 02/20/2011   Dysphagia 10/23/2012   due to thyroid  issues   Dysphagia 03/01/2011   Fatigue/Loss of Sleep 02/20/2011   FHx: allergies 02/20/2011   Ask patient to clarify. Only had allergies noted. Need more detail per medical history form dated 01/18/11.   Generalized headaches 02/20/2011   GERD (gastroesophageal reflux disease)    History of tonsillectomy    Hoarseness/sore throat 02/20/2011   Hypertension    Miscarriage    One - date not on medical form.   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   Rosacea    Runny nose 02/20/2011   Seasonal allergies     uses albuterol  inhaler as needed for allergies   Sinus problem 02/20/2011   Thyroid  goiter 10/23/2012   multinodular- has been followed x 52yrs   Wears glasses/contacts 02/20/2011    Past Surgical History Past Surgical History:  Procedure Laterality Date   ABDOMINAL HYSTERECTOMY     ANTERIOR AND POSTERIOR REPAIR N/A 02/21/2017   Procedure: ANTERIOR (CYSTOCELE) AND POSTERIOR REPAIR (RECTOCELE);  Surgeon: Horacio Boas, MD;  Location: [facility];  Service: Gynecology;  Laterality: N/A;   BILATERAL SALPINGECTOMY N/A 02/21/2017   Procedure: BILATERAL SALPINGECTOMY;  Surgeon: Horacio Boas, MD;  Location: [facility];  Service: Gynecology;  Laterality: N/A;   BLADDER SUSPENSION N/A 02/21/2017   Procedure: Solyx Sling;  Surgeon: Horacio Boas, MD;  Location: [facility];  Service: Gynecology;  Laterality: N/A;   broken ankle Right    Pins/rod inserted-retained   COLONOSCOPY     DILATION AND CURETTAGE OF UTERUS     MAB   FRACTURE SURGERY  ORIF ANKLE FRACTURE Right 12/19/2018   Procedure: Removal of hardware and open reduction internal fixation of right ankle bimalleolar fracture;  Surgeon: Kit Rush, MD;  Location: [facility];  Service: Orthopedics;  Laterality: Right;  ok to open room per Tammy at [facility]   THYROIDECTOMY N/A 10/29/2012   Procedure: THYROIDECTOMY;  Surgeon: Krystal CHRISTELLA Spinner, MD;  Location: [facility];  Service: General;  Laterality: N/A;  Total Thyroidectomy   TONSILLECTOMY     VAGINAL HYSTERECTOMY Bilateral 02/21/2017   Procedure: HYSTERECTOMY VAGINAL;  Surgeon: Horacio Krystal, MD;  Location: [facility];  Service: Gynecology;  Laterality: Bilateral;   WISDOM TOOTH EXTRACTION      Family History family history includes Alcohol  abuse in her father; Arthritis in her mother; Cancer in her mother; Colon polyps in her maternal grandmother; Heart disease in her father and mother; Hypertension in her mother; Kidney disease in her mother; Obesity in her mother;  Vision loss in her father.  Social History Social History   Socioeconomic History   Marital status: Divorced    Spouse name: Not on file   Number of children: 5   Years of education: Not on file   Highest education level: Not on file  Occupational History   Not on file  Tobacco Use   Smoking status: Never   Smokeless tobacco: Never  Vaping Use   Vaping status: Never Used  Substance and Sexual Activity   Alcohol  use: Not Currently    Comment: rare -social   Drug use: No   Sexual activity: Not Currently    Birth control/protection: Abstinence, Condom, Post-menopausal  Other Topics Concern   Not on file  Social History Narrative   Not on file   Social Drivers of Health   Financial Resource Strain: Low Risk  (05/21/2023)   Overall Financial Resource Strain (CARDIA)    Difficulty of Paying Living Expenses: Not hard at all  Food Insecurity: No Food Insecurity (05/21/2023)   Hunger Vital Sign    Worried About Running Out of Food in the Last Year: Never true    Ran Out of Food in the Last Year: Never true  Transportation Needs: No Transportation Needs (05/21/2023)   PRAPARE - Administrator, Civil Service (Medical): No    Lack of Transportation (Non-Medical): No  Physical Activity: Insufficiently Active (05/21/2023)   Exercise Vital Sign    Days of Exercise per Week: 3 days    Minutes of Exercise per Session: 30 min  Stress: No Stress Concern Present (05/21/2023)   [employer] of Occupational Health - Occupational Stress Questionnaire    Feeling of Stress : Not at all  Social Connections: Moderately Integrated (05/21/2023)   Social Connection and Isolation Panel [NHANES]    Frequency of Communication with Friends and Family: More than three times a week    Frequency of Social Gatherings with Friends and Family: More than three times a week    Attends Religious Services: More than 4 times per year    Active Member of [employer] or Organizations: Yes    Attends Occupational Hygienist Meetings: 1 to 4 times per year    Marital Status: Divorced  Intimate Partner Violence: Not At Risk (05/21/2023)   Humiliation, Afraid, Rape, and Kick questionnaire    Fear of Current or Ex-Partner: No    Emotionally Abused: No    Physically Abused: No    Sexually Abused: No    Laboratory Investigations Lab Results  Component Value  Date   TSH 0.01 Repeated and verified X2. (L) 07/04/2023   TSH 0.05 (L) 06/21/2023   TSH 0.36 05/02/2022   FREET4 2.1 (H) 10/02/2023   FREET4 1.33 07/04/2023   FREET4 1.19 05/02/2022     No results found for: TSI   No components found for: TRAB   Lab Results  Component Value Date   CHOL 197 05/02/2022   Lab Results  Component Value Date   HDL 49.80 05/02/2022   Lab Results  Component Value Date   LDLCALC 123 (H) 05/02/2022   Lab Results  Component Value Date   TRIG 122.0 05/02/2022   Lab Results  Component Value Date   CHOLHDL 4 05/02/2022   Lab Results  Component Value Date   CREATININE 0.73 10/30/2012   No results found for: GFR    Component Value Date/Time   NA 134 (L) 10/30/2012 0426   K 3.9 10/30/2012 0426   CL 100 10/30/2012 0426   CO2 26 10/30/2012 0426   GLUCOSE 123 (H) 10/30/2012 0426   BUN 7 10/30/2012 0426   CREATININE 0.73 10/30/2012 0426   CALCIUM  8.2 (L) 10/30/2012 0426   GFRNONAA >90 10/30/2012 0426   GFRAA >90 10/30/2012 0426      Latest Ref Rng & Units 10/30/2012    4:26 AM  BMP  Glucose 70 - 99 mg/dL 876   BUN 6 - 23 mg/dL 7   Creatinine 9.49 - 8.89 mg/dL 9.26   Sodium 864 - 854 mEq/L 134   Potassium 3.5 - 5.1 mEq/L 3.9   Chloride 96 - 112 mEq/L 100   CO2 19 - 32 mEq/L 26   Calcium  8.4 - 10.5 mg/dL 8.2        Component Value Date/Time   WBC 10.4 02/22/2017 0630   RBC 3.62 (L) 02/22/2017 0630   HGB 10.2 (L) 02/22/2017 0630   HCT 30.6 (L) 02/22/2017 0630   PLT 214 02/22/2017 0630   MCV 84.5 02/22/2017 0630   MCH 28.2 02/22/2017 0630   MCHC 33.3 02/22/2017 0630   RDW 14.0  02/22/2017 0630   LYMPHSABS 2.6 02/13/2017 1345   MONOABS 0.2 02/13/2017 1345   EOSABS 0.2 02/13/2017 1345   BASOSABS 0.0 02/13/2017 1345      Parts of this note may have been dictated using voice recognition software. There may be variances in spelling and vocabulary which are unintentional. Not all errors are proofread. Please notify the dino if any discrepancies are noted or if the meaning of any statement is not clear.

## 2023-10-30 DIAGNOSIS — J342 Deviated nasal septum: Secondary | ICD-10-CM | POA: Insufficient documentation

## 2023-10-30 DIAGNOSIS — J343 Hypertrophy of nasal turbinates: Secondary | ICD-10-CM | POA: Diagnosis not present

## 2023-10-30 DIAGNOSIS — J3489 Other specified disorders of nose and nasal sinuses: Secondary | ICD-10-CM | POA: Insufficient documentation

## 2023-10-30 DIAGNOSIS — M950 Acquired deformity of nose: Secondary | ICD-10-CM | POA: Diagnosis not present

## 2023-10-30 DIAGNOSIS — J34829 Nasal valve collapse, unspecified: Secondary | ICD-10-CM | POA: Diagnosis not present

## 2023-11-06 ENCOUNTER — Other Ambulatory Visit: Payer: Self-pay

## 2023-11-14 ENCOUNTER — Ambulatory Visit (HOSPITAL_BASED_OUTPATIENT_CLINIC_OR_DEPARTMENT_OTHER): Admitting: Orthopaedic Surgery

## 2023-11-14 ENCOUNTER — Other Ambulatory Visit: Payer: Self-pay | Admitting: Family

## 2023-11-14 ENCOUNTER — Ambulatory Visit (HOSPITAL_BASED_OUTPATIENT_CLINIC_OR_DEPARTMENT_OTHER)

## 2023-11-14 DIAGNOSIS — M25572 Pain in left ankle and joints of left foot: Secondary | ICD-10-CM

## 2023-11-14 DIAGNOSIS — M7732 Calcaneal spur, left foot: Secondary | ICD-10-CM | POA: Diagnosis not present

## 2023-11-14 MED ORDER — TRIAMCINOLONE ACETONIDE 40 MG/ML IJ SUSP
80.0000 mg | INTRAMUSCULAR | Status: AC | PRN
Start: 2023-11-14 — End: 2023-11-14
  Administered 2023-11-14: 80 mg via INTRA_ARTICULAR

## 2023-11-14 MED ORDER — LIDOCAINE HCL 1 % IJ SOLN
4.0000 mL | INTRAMUSCULAR | Status: AC | PRN
  Administered 2023-11-14: 4 mL

## 2023-11-14 NOTE — Progress Notes (Signed)
 Chief Complaint: Left hip lower back pain     History of Present Illness:   11/14/2023: Presents today with left ankle pain.  She states that this feels quite similar to how her previous right ankle felt.  She did quite well with the previous right ankle injection  Alyssa Fisher is a 58 y.o. female presents with over 10 years of left-sided back pain with some radiation down the thigh.  She has previously taken tramadol and Mobic for this which does help her.  She has undergone 2 rounds of physical therapy for several months with minimal relief.  She has not had any injections.  She did have a fall down the steps in 2011 at which time the pain began on the left side.  She describes it as a pinching feeling.  She is having a hard time laying directly on the side.  She is very active with her children and grandchildren.  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    Surgical History:   None  PMH/PSH/Family History/Social History/Meds/Allergies:    Past Medical History:  Diagnosis Date  . Anxiety   . Broken ankle   . Chronic left-sided lumbar radiculopathy 01/05/2022  . Contraceptive management 11/21/2012  . Depression   . Dizziness - light-headed/fainting 02/20/2011  . Dysphagia 10/23/2012   due to thyroid issues  . Dysphagia 03/01/2011  . Fatigue/Loss of Sleep 02/20/2011  . FHx: allergies 02/20/2011   Ask patient to clarify. Only had "allergies" noted. Need more detail per medical history form dated 01/18/11.  . Generalized headaches 02/20/2011  . GERD (gastroesophageal reflux disease)   . History of tonsillectomy   . Hoarseness/sore throat 02/20/2011  . Hypertension   . Miscarriage    One - date not on medical form.  . Mitral valve prolapse 10/23/2012   no meds  . Night sweats 02/20/2011  . Obesity (BMI 30-39.9) 05/22/2022  . Onychomycosis 12/16/2013  . Rash/bruises easily 02/20/2011  . Retinitis pigmentosa of  both eyes 10/23/2012   legally blind  . Rosacea   . Runny nose 02/20/2011  . Seasonal allergies    uses albuterol inhaler as needed for allergies  . Sinus problem 02/20/2011  . Thyroid goiter 10/23/2012   multinodular- has been followed x 16yrs  . Wears glasses/contacts 02/20/2011   Past Surgical History:  Procedure Laterality Date  . ABDOMINAL HYSTERECTOMY    . ANTERIOR AND POSTERIOR REPAIR N/A 02/21/2017   Procedure: ANTERIOR (CYSTOCELE) AND POSTERIOR REPAIR (RECTOCELE);  Surgeon: Lavina Hamman, MD;  Location: [facility];  Service: Gynecology;  Laterality: N/A;  . BILATERAL SALPINGECTOMY N/A 02/21/2017   Procedure: BILATERAL SALPINGECTOMY;  Surgeon: Lavina Hamman, MD;  Location: [facility];  Service: Gynecology;  Laterality: N/A;  . BLADDER SUSPENSION N/A 02/21/2017   Procedure: Solyx Sling;  Surgeon: Lavina Hamman, MD;  Location: [facility];  Service: Gynecology;  Laterality: N/A;  . broken ankle Right    Pins/rod inserted-retained  . COLONOSCOPY    . DILATION AND CURETTAGE OF UTERUS     MAB  . FRACTURE SURGERY    . ORIF ANKLE FRACTURE Right 12/19/2018   Procedure: Removal of hardware and open reduction internal fixation of right ankle bimalleolar fracture;  Surgeon: Toni Arthurs, MD;  Location: [facility];  Service: Orthopedics;  Laterality: Right;  ok to open room per [facility]  . THYROIDECTOMY N/A 10/29/2012   Procedure: THYROIDECTOMY;  Surgeon: Velora Heckler, MD;  Location: [facility];  Service: General;  Laterality: N/A;  Total Thyroidectomy  . TONSILLECTOMY    . VAGINAL HYSTERECTOMY Bilateral 02/21/2017   Procedure: HYSTERECTOMY VAGINAL;  Surgeon: Lavina Hamman, MD;  Location: [facility];  Service: Gynecology;  Laterality: Bilateral;  . WISDOM TOOTH EXTRACTION     Social History   Socioeconomic History  . Marital status: Divorced    Spouse name: Not on file  . Number of children: 5  . Years of education: Not on file  . Highest education level: Not  on file  Occupational History  . Not on file  Tobacco Use  . Smoking status: Never  . Smokeless tobacco: Never  Vaping Use  . Vaping status: Never Used  Substance and Sexual Activity  . Alcohol use: Not Currently    Comment: rare -social  . Drug use: No  . Sexual activity: Not Currently    Birth control/protection: Abstinence, Condom, Post-menopausal  Other Topics Concern  . Not on file  Social History Narrative  . Not on file   Social Drivers of Health   Financial Resource Strain: Low Risk  (05/21/2023)   Overall Financial Resource Strain (CARDIA)   . Difficulty of Paying Living Expenses: Not hard at all  Food Insecurity: Low Risk  (10/30/2023)   Received from [facility]   Hunger Vital Sign   . Worried About Programme researcher, broadcasting/film/video in the Last Year: Never true   . Ran Out of Food in the Last Year: Never true  Transportation Needs: No Transportation Needs (10/30/2023)   Received from [facility]   . In the past 12 months, has lack of reliable transportation kept you from medical appointments, meetings, work or from getting things needed for daily living? : No  Physical Activity: Insufficiently Active (05/21/2023)   Exercise Vital Sign   . Days of Exercise per Week: 3 days   . Minutes of Exercise per Session: 30 min  Stress: No Stress Concern Present (05/21/2023)   [employer] of Occupational Health - Occupational Stress Questionnaire   . Feeling of Stress : Not at all  Social Connections: Moderately Integrated (05/21/2023)   Social Connection and Isolation Panel [NHANES]   . Frequency of Communication with Friends and Family: More than three times a week   . Frequency of Social Gatherings with Friends and Family: More than three times a week   . Attends Religious Services: More than 4 times per year   . Active Member of Clubs or Organizations: Yes   . Attends Banker Meetings: 1 to 4 times per year   . Marital Status: Divorced   Family  History  Problem Relation Age of Onset  . Arthritis Mother   . Cancer Mother   . Heart disease Mother   . Hypertension Mother   . Kidney disease Mother   . Obesity Mother   . Heart disease Father   . Alcohol abuse Father   . Vision loss Father   . Colon polyps Maternal Grandmother    Allergies  Allergen Reactions  . Augmentin [Amoxicillin-Pot Clavulanate] Diarrhea  . Codeine     Patient stated "possible" due to mother and grandparents being allergic as well. Per medical history form dated 01/18/11. Prefers not to have.   Current Outpatient Medications  Medication Sig Dispense Refill  .  ALPRAZolam (XANAX) 0.25 MG tablet Take 1 tablet (0.25 mg total) by mouth daily as needed for anxiety.    Marland Kitchen azelastine (ASTELIN) 0.1 % nasal spray Place 2 sprays into both nostrils 2 (two) times daily. Use in each nostril as directed 30 mL 5  . cetirizine (ZYRTEC) 10 MG tablet Take 10 mg by mouth daily.    . diazepam (VALIUM) 5 MG tablet Take 1 by mouth 1 hour  pre-procedure with very light food. May bring 2nd tablet to appointment. (Patient not taking: Reported on 08/28/2023) 2 tablet 0  . DILT-XR 120 MG 24 hr capsule TAKE 1 CAPSULE BY MOUTH EVERY DAY 90 capsule 1  . escitalopram (LEXAPRO) 10 MG tablet TAKE 2 TABLETS BY MOUTH EVERY DAY 180 tablet 1  . fluticasone (FLONASE) 50 MCG/ACT nasal spray Place 2 sprays into both nostrils daily. 11 mL 6  . levothyroxine (SYNTHROID) 137 MCG tablet Take 1 tablet (137 mcg total) by mouth daily before breakfast. 90 tablet 1  . levothyroxine (SYNTHROID) 75 MCG tablet TAKE 2 TABLETS BY MOUTH DAILY BEFORE BREAKFAST. 180 tablet 1  . Multiple Vitamin (MULTIVITAMIN ADULT PO) Take by mouth.    Marland Kitchen omeprazole (PRILOSEC) 20 MG capsule TAKE 1 CAPSULE BY MOUTH EVERY DAY 90 capsule 0  . predniSONE (DELTASONE) 10 MG tablet Take 1 tablet (10 mg total) by mouth daily with breakfast. Take Prednisone by mouth (PO) 30mg  x 4 days (3 pills in morning), then 20mg  x4 days (2 pills), then  10mg  x 4 days (1 pill), then stop. Risks discussed 24 tablet 0  . Semaglutide,0.25 or 0.5MG /DOS, (OZEMPIC, 0.25 OR 0.5 MG/DOSE,) 2 MG/3ML SOPN Inject 0.5 mg into the skin once a week. 3 mL 2   No current facility-administered medications for this visit.   No results found.  Review of Systems:   A ROS was performed including pertinent positives and negatives as documented in the HPI.  Physical Exam :   Constitutional: NAD and appears stated age Neurological: Alert and oriented Psych: Appropriate affect and cooperative Last menstrual period 10/12/2012.   Comprehensive Musculoskeletal Exam:    Worsening pain with straight leg raise.  No radiation or weakness in the left lower leg.  Reflexes symmetric and equal.  Remainder of neurosensory exam is intact  Tenderness palpation about the right ankle joint this is centered deep and essentially about the joint.  No tenderness about her hardware medially or laterally.  Imaging:    X-ray 4 views lumbar spine There is a scoliotic deformity involving the lumbar spine.  X-ray right ankle 3 views: Displaced open reduction internal fixation with some mild medial joint space narrowing consistent with posttraumatic osteoarthritis I personally reviewed and interpreted the radiographs.   Assessment:   58 y.o. female today with left ankle pain with mild degenerative findings.  Given the fact that she did get quite good relief from her previous right ankle injection I have recommended ultrasound-guided injection of the left ankle.  She would like to proceed with this today Plan :    -Left ankle injection provided after verbal consent obtained    Procedure Note  Patient: Alyssa Fisher             Date of Birth: 11-22-1965           MRN: [id number]             Visit Date: 11/14/2023  Procedures: Visit Diagnoses:  1. Pain in left ankle and joints of left foot  Medium Joint Inj: L ankle on 11/14/2023 1:29 PM Indications: pain Details: 22 G  1.5 in needle, ultrasound-guided anterior approach Medications: 4 mL lidocaine 1 %; 80 mg triamcinolone acetonide 40 MG/ML Outcome: tolerated well, no immediate complications Immediately prior to procedure a time out was called to verify the correct patient, procedure, equipment, support staff and site/side marked as required. Patient was prepped and draped in the usual sterile fashion.       I personally saw and evaluated the patient, and participated in the management and treatment plan.  Huel Cote, MD Attending Physician, Orthopedic Surgery  This document was dictated using Dragon voice recognition software. A reasonable attempt at proof reading has been made to minimize errors.

## 2023-11-15 ENCOUNTER — Other Ambulatory Visit: Payer: 59

## 2023-11-15 DIAGNOSIS — E890 Postprocedural hypothyroidism: Secondary | ICD-10-CM | POA: Diagnosis not present

## 2023-11-16 LAB — TSH: TSH: 0.07 m[IU]/L — ABNORMAL LOW (ref 0.40–4.50)

## 2023-11-16 LAB — T4, FREE: Free T4: 1 ng/dL (ref 0.8–1.8)

## 2023-11-20 ENCOUNTER — Ambulatory Visit (INDEPENDENT_AMBULATORY_CARE_PROVIDER_SITE_OTHER): Payer: 59 | Admitting: "Endocrinology

## 2023-11-20 ENCOUNTER — Encounter: Payer: Self-pay | Admitting: "Endocrinology

## 2023-11-20 VITALS — BP 124/80 | HR 85 | Ht 65.0 in | Wt 189.0 lb

## 2023-11-20 DIAGNOSIS — E890 Postprocedural hypothyroidism: Secondary | ICD-10-CM

## 2023-11-20 DIAGNOSIS — F419 Anxiety disorder, unspecified: Secondary | ICD-10-CM | POA: Diagnosis not present

## 2023-11-20 MED ORDER — LEVOTHYROXINE SODIUM 112 MCG PO TABS: 112.0000 ug | ORAL_TABLET | Freq: Every day | ORAL | 3 refills | Status: DC

## 2023-11-20 NOTE — Progress Notes (Signed)
 Outpatient Endocrinology Note Altamese Clayton, MD  11/20/23   Alyssa Fisher 06/25/1966 [id number]  Referring Provider: Dulce Sellar, NP Primary Care Provider: Dulce Sellar, NP Subjective  No chief complaint on file.   Assessment & Plan  Diagnoses and all orders for this visit:  Hypothyroidism, postsurgical -     TSH(Reflex)  Anxiety  Other orders -     levothyroxine (SYNTHROID) 112 MCG tablet; Take 1 tablet (112 mcg total) by mouth daily.   Alyssa Fisher is currently taking levothyroxine 137 mcg SIX days a week, takes appropriately. Patient is currently biochemically hyperthyroid.  Educated on thyroid axis.  Recommend the following: Take levothyroxine 112 mcg seven days a week. Advised to take levothyroxine first thing in the morning on empty stomach and wait at least 30 minutes to 1 hour before eating or drinking anything or taking any other medications. Space out levothyroxine by 4 hours from any acid reflux medication/fibrate/iron/calcium/multivitamin. Advised to take birth control pills and nutritional supplements in the evening. Repeat lab before next visit or sooner if symptoms of hyperthyroidism or hypothyroidism develop.  Notify us immediately in case of significant weight gain or loss. Counseled on compliance and follow up needs.  Patient reports worsened anxiety Discussed about meditation and de-stressing techniques  Return in about 16 weeks (around 03/11/2024) for visit + labs before next visit.   Altamese De Soto, MD  11/20/23   I have reviewed current medications, nurse's notes, allergies, vital signs, past medical and surgical history, family medical history, and social history for this encounter. Counseled patient on symptoms, examination findings, lab findings, imaging results, treatment decisions and monitoring and prognosis. The patient understood the recommendations and agrees with the treatment plan. All questions regarding treatment plan  were fully answered.   History of Present Illness Alyssa Fisher is a 58 y.o. year old female who presents to our clinic with postsurgical diagnosed in 2014.    C/o dry hair with hair fall, still not sleeping well, not feeling good overall Has improvement in hyperdefecation and heat intolerance No weight loss Has anxiety-  On levothyroxine 137 mcg every morning 6 days/week, takes appropriately   Compressive symptoms:  dysphagia  No dysphonia  No positional dyspnea (especially with simultaneous arms elevation)  No  Smokes  No On biotin  No Personal history of head/neck surgery/irradiation  Yes, total thyroidectomy in 2014 for thyroid nodules, no cancer per pt, unable to find final pathology report   Physical Exam  BP 124/80   Pulse 85   Ht 5\' 5"  (1.651 m)   Wt 189 lb (85.7 kg)   LMP 10/12/2012   SpO2 96%   BMI 31.45 kg/m  Constitutional: well developed, well nourished Head: normocephalic, atraumatic, no exophthalmos Eyes: sclera anicteric, no redness Neck: thyroidectomy scar Lungs: normal respiratory effort Neurology: alert and oriented, no fine hand tremor Skin: dry, no appreciable rashes Musculoskeletal: no appreciable defects Psychiatric: normal mood and affect  Allergies Allergies  Allergen Reactions   Augmentin [Amoxicillin-Pot Clavulanate] Diarrhea   Codeine     Patient stated "possible" due to mother and grandparents being allergic as well. Per medical history form dated 01/18/11. Prefers not to have.    Current Medications Patient's Medications  New Prescriptions   LEVOTHYROXINE (SYNTHROID) 112 MCG TABLET    Take 1 tablet (112 mcg total) by mouth daily.  Previous Medications   ALPRAZOLAM (XANAX) 0.25 MG TABLET    Take 1 tablet (0.25 mg total) by mouth daily as needed for  anxiety.   AZELASTINE (ASTELIN) 0.1 % NASAL SPRAY    Place 2 sprays into both nostrils 2 (two) times daily. Use in each nostril as directed   CETIRIZINE (ZYRTEC) 10 MG TABLET    Take 10 mg  by mouth daily.   DIAZEPAM (VALIUM) 5 MG TABLET    Take 1 by mouth 1 hour  pre-procedure with very light food. May bring 2nd tablet to appointment.   DILT-XR 120 MG 24 HR CAPSULE    TAKE 1 CAPSULE BY MOUTH EVERY DAY   ESCITALOPRAM (LEXAPRO) 10 MG TABLET    TAKE 2 TABLETS BY MOUTH EVERY DAY   FLUTICASONE (FLONASE) 50 MCG/ACT NASAL SPRAY    Place 2 sprays into both nostrils daily.   MULTIPLE VITAMIN (MULTIVITAMIN ADULT PO)    Take by mouth.   OMEPRAZOLE (PRILOSEC) 20 MG CAPSULE    TAKE 1 CAPSULE BY MOUTH EVERY DAY   PREDNISONE (DELTASONE) 10 MG TABLET    Take 1 tablet (10 mg total) by mouth daily with breakfast. Take Prednisone by mouth (PO) 30mg  x 4 days (3 pills in morning), then 20mg  x4 days (2 pills), then 10mg  x 4 days (1 pill), then stop. Risks discussed   SEMAGLUTIDE,0.25 OR 0.5MG /DOS, (OZEMPIC, 0.25 OR 0.5 MG/DOSE,) 2 MG/3ML SOPN    Inject 0.5 mg into the skin once a week.  Modified Medications   No medications on file  Discontinued Medications   LEVOTHYROXINE (SYNTHROID) 137 MCG TABLET    Take 1 tablet (137 mcg total) by mouth daily before breakfast.   LEVOTHYROXINE (SYNTHROID) 75 MCG TABLET    TAKE 2 TABLETS BY MOUTH DAILY BEFORE BREAKFAST.    Past Medical History Past Medical History:  Diagnosis Date   Anxiety    Broken ankle    Chronic left-sided lumbar radiculopathy 01/05/2022   Contraceptive management 11/21/2012   Depression    Dizziness - light-headed/fainting 02/20/2011   Dysphagia 10/23/2012   due to thyroid issues   Dysphagia 03/01/2011   Fatigue/Loss of Sleep 02/20/2011   FHx: allergies 02/20/2011   Ask patient to clarify. Only had "allergies" noted. Need more detail per medical history form dated 01/18/11.   Generalized headaches 02/20/2011   GERD (gastroesophageal reflux disease)    History of tonsillectomy    Hoarseness/sore throat 02/20/2011   Hypertension    Miscarriage    One - date not on medical form.   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   Rosacea    Runny nose 02/20/2011   Seasonal allergies    uses albuterol inhaler as needed for allergies   Sinus problem 02/20/2011   Thyroid goiter 10/23/2012   multinodular- has been followed x 56yrs   Wears glasses/contacts 02/20/2011    Past Surgical History Past Surgical History:  Procedure Laterality Date   ABDOMINAL HYSTERECTOMY     ANTERIOR AND POSTERIOR REPAIR N/A 02/21/2017   Procedure: ANTERIOR (CYSTOCELE) AND POSTERIOR REPAIR (RECTOCELE);  Surgeon: Lavina Hamman, MD;  Location: [facility];  Service: Gynecology;  Laterality: N/A;   BILATERAL SALPINGECTOMY N/A 02/21/2017   Procedure: BILATERAL SALPINGECTOMY;  Surgeon: Lavina Hamman, MD;  Location: [facility];  Service: Gynecology;  Laterality: N/A;   BLADDER SUSPENSION N/A 02/21/2017   Procedure: Solyx Sling;  Surgeon: Lavina Hamman, MD;  Location: [facility];  Service: Gynecology;  Laterality: N/A;   broken ankle Right    Pins/rod inserted-retained   COLONOSCOPY     DILATION AND CURETTAGE OF UTERUS     MAB   FRACTURE SURGERY     ORIF ANKLE FRACTURE Right 12/19/2018   Procedure: Removal of hardware and open reduction internal fixation of right ankle bimalleolar fracture;  Surgeon: Toni Arthurs, MD;  Location: [facility];  Service: Orthopedics;  Laterality: Right;  ok to open room per Tammy at [facility]   THYROIDECTOMY N/A 10/29/2012   Procedure: THYROIDECTOMY;  Surgeon: Velora Heckler, MD;  Location: [facility];  Service: General;  Laterality: N/A;  Total Thyroidectomy   TONSILLECTOMY     VAGINAL HYSTERECTOMY Bilateral 02/21/2017   Procedure: HYSTERECTOMY VAGINAL;  Surgeon: Lavina Hamman, MD;  Location: [facility];  Service: Gynecology;  Laterality: Bilateral;   WISDOM TOOTH EXTRACTION      Family History family history  includes Alcohol abuse in her father; Arthritis in her mother; Cancer in her mother; Colon polyps in her maternal grandmother; Heart disease in her father and mother; Hypertension in her mother; Kidney disease in her mother; Obesity in her mother; Vision loss in her father.  Social History Social History   Socioeconomic History   Marital status: Divorced    Spouse name: Not on file   Number of children: 5   Years of education: Not on file   Highest education level: Not on file  Occupational History   Not on file  Tobacco Use   Smoking status: Never   Smokeless tobacco: Never  Vaping Use   Vaping status: Never Used  Substance and Sexual Activity   Alcohol use: Not Currently    Comment: rare -social   Drug use: No   Sexual activity: Not Currently    Birth control/protection: Abstinence, Condom, Post-menopausal  Other Topics Concern   Not on file  Social History Narrative   Not on file   Social Drivers of Health   Financial Resource Strain: Low Risk  (05/21/2023)   Overall Financial Resource Strain (CARDIA)    Difficulty of Paying Living Expenses: Not hard at all  Food Insecurity: Low Risk  (10/30/2023)   Received from [facility]   Hunger Vital Sign    Worried About Running Out of Food in the Last Year: Never true    Ran Out of Food in the Last Year: Never true  Transportation Needs: No Transportation Needs (10/30/2023)   Received from [facility]    In the past 12 months, has lack of reliable transportation kept you from medical appointments, meetings, work or from getting things needed for daily living? : No  Physical Activity: Insufficiently Active (05/21/2023)   Exercise Vital Sign    Days of Exercise per Week: 3 days    Minutes of Exercise per Session: 30 min  Stress: No Stress Concern Present (05/21/2023)   [employer] of Occupational Health - Occupational Stress Questionnaire    Feeling of Stress : Not at all  Social Connections: Moderately  Integrated (05/21/2023)   Social Connection and Isolation Panel [NHANES]    Frequency of Communication with Friends and Family: More than three times a week    Frequency of Social Gatherings with Friends and Family: More than three times a week    Attends Religious Services: More than 4 times per year    Active Member of [employer] or Organizations: Yes    Attends Banker Meetings: 1 to 4 times  per year    Marital Status: Divorced  Intimate Partner Violence: Not At Risk (05/21/2023)   Humiliation, Afraid, Rape, and Kick questionnaire    Fear of Current or Ex-Partner: No    Emotionally Abused: No    Physically Abused: No    Sexually Abused: No    Laboratory Investigations Lab Results  Component Value Date   TSH 0.07 (L) 11/15/2023   TSH 0.01 Repeated and verified X2. (L) 07/04/2023   TSH 0.05 (L) 06/21/2023   FREET4 1.0 11/15/2023   FREET4 2.1 (H) 10/02/2023   FREET4 1.33 07/04/2023     No results found for: "TSI"   No components found for: "TRAB"   Lab Results  Component Value Date   CHOL 197 05/02/2022   Lab Results  Component Value Date   HDL 49.80 05/02/2022   Lab Results  Component Value Date   LDLCALC 123 (H) 05/02/2022   Lab Results  Component Value Date   TRIG 122.0 05/02/2022   Lab Results  Component Value Date   CHOLHDL 4 05/02/2022   Lab Results  Component Value Date   CREATININE 0.73 10/30/2012   No results found for: "GFR"    Component Value Date/Time   NA 134 (L) 10/30/2012 0426   K 3.9 10/30/2012 0426   CL 100 10/30/2012 0426   CO2 26 10/30/2012 0426   GLUCOSE 123 (H) 10/30/2012 0426   BUN 7 10/30/2012 0426   CREATININE 0.73 10/30/2012 0426   CALCIUM 8.2 (L) 10/30/2012 0426   GFRNONAA >90 10/30/2012 0426   GFRAA >90 10/30/2012 0426      Latest Ref Rng & Units 10/30/2012    4:26 AM  BMP  Glucose 70 - 99 mg/dL 604   BUN 6 - 23 mg/dL 7   Creatinine 5.40 - 9.81 mg/dL 1.91   Sodium 478 - 295 mEq/L 134   Potassium 3.5 - 5.1 mEq/L  3.9   Chloride 96 - 112 mEq/L 100   CO2 19 - 32 mEq/L 26   Calcium 8.4 - 10.5 mg/dL 8.2        Component Value Date/Time   WBC 10.4 02/22/2017 0630   RBC 3.62 (L) 02/22/2017 0630   HGB 10.2 (L) 02/22/2017 0630   HCT 30.6 (L) 02/22/2017 0630   PLT 214 02/22/2017 0630   MCV 84.5 02/22/2017 0630   MCH 28.2 02/22/2017 0630   MCHC 33.3 02/22/2017 0630   RDW 14.0 02/22/2017 0630   LYMPHSABS 2.6 02/13/2017 1345   MONOABS 0.2 02/13/2017 1345   EOSABS 0.2 02/13/2017 1345   BASOSABS 0.0 02/13/2017 1345      Parts of this note may have been dictated using voice recognition software. There may be variances in spelling and vocabulary which are unintentional. Not all errors are proofread. Please notify the Thereasa Parkin if any discrepancies are noted or if the meaning of any statement is not clear.

## 2023-12-04 DIAGNOSIS — H3552 Pigmentary retinal dystrophy: Secondary | ICD-10-CM | POA: Diagnosis not present

## 2023-12-04 DIAGNOSIS — H25043 Posterior subcapsular polar age-related cataract, bilateral: Secondary | ICD-10-CM | POA: Diagnosis not present

## 2023-12-04 DIAGNOSIS — H5213 Myopia, bilateral: Secondary | ICD-10-CM | POA: Diagnosis not present

## 2023-12-04 DIAGNOSIS — H52223 Regular astigmatism, bilateral: Secondary | ICD-10-CM | POA: Diagnosis not present

## 2023-12-18 ENCOUNTER — Other Ambulatory Visit: Payer: Self-pay | Admitting: Family

## 2023-12-18 DIAGNOSIS — F419 Anxiety disorder, unspecified: Secondary | ICD-10-CM

## 2023-12-26 ENCOUNTER — Other Ambulatory Visit (HOSPITAL_COMMUNITY): Payer: Self-pay

## 2023-12-27 ENCOUNTER — Other Ambulatory Visit (HOSPITAL_COMMUNITY): Payer: Self-pay

## 2024-01-11 ENCOUNTER — Other Ambulatory Visit: Payer: Self-pay | Admitting: Family

## 2024-01-11 ENCOUNTER — Other Ambulatory Visit (INDEPENDENT_AMBULATORY_CARE_PROVIDER_SITE_OTHER): Payer: Self-pay | Admitting: Otolaryngology

## 2024-01-11 DIAGNOSIS — I10 Essential (primary) hypertension: Secondary | ICD-10-CM

## 2024-01-12 ENCOUNTER — Other Ambulatory Visit (INDEPENDENT_AMBULATORY_CARE_PROVIDER_SITE_OTHER): Payer: Self-pay | Admitting: Otolaryngology

## 2024-01-16 ENCOUNTER — Telehealth: Payer: Self-pay

## 2024-01-16 ENCOUNTER — Other Ambulatory Visit (INDEPENDENT_AMBULATORY_CARE_PROVIDER_SITE_OTHER): Payer: Self-pay | Admitting: Otolaryngology

## 2024-01-16 NOTE — Telephone Encounter (Signed)
 Pharmacy Patient Advocate Encounter  Received notification from [employer] that Prior Authorization for Ozempic  (0.25 or 0.5 MG/DOSE) 2MG /3ML pen-injectors has been DENIED.  Full denial letter will be uploaded to the media tab. See denial reason below.  OZEMPIC  INJ 2MG /3ML is denied due to Plan Exclusion. For further questions, call [phone]

## 2024-02-08 ENCOUNTER — Other Ambulatory Visit: Payer: Self-pay | Admitting: Family

## 2024-02-25 ENCOUNTER — Encounter (HOSPITAL_BASED_OUTPATIENT_CLINIC_OR_DEPARTMENT_OTHER): Payer: Self-pay | Admitting: Orthopaedic Surgery

## 2024-02-25 DIAGNOSIS — B078 Other viral warts: Secondary | ICD-10-CM | POA: Diagnosis not present

## 2024-02-25 DIAGNOSIS — L821 Other seborrheic keratosis: Secondary | ICD-10-CM | POA: Diagnosis not present

## 2024-02-25 DIAGNOSIS — D225 Melanocytic nevi of trunk: Secondary | ICD-10-CM | POA: Diagnosis not present

## 2024-02-25 DIAGNOSIS — L859 Epidermal thickening, unspecified: Secondary | ICD-10-CM | POA: Diagnosis not present

## 2024-02-26 ENCOUNTER — Ambulatory Visit (HOSPITAL_BASED_OUTPATIENT_CLINIC_OR_DEPARTMENT_OTHER): Admitting: Student

## 2024-02-26 ENCOUNTER — Ambulatory Visit (HOSPITAL_BASED_OUTPATIENT_CLINIC_OR_DEPARTMENT_OTHER)

## 2024-02-26 ENCOUNTER — Other Ambulatory Visit (HOSPITAL_BASED_OUTPATIENT_CLINIC_OR_DEPARTMENT_OTHER): Payer: Self-pay

## 2024-02-26 ENCOUNTER — Encounter (HOSPITAL_BASED_OUTPATIENT_CLINIC_OR_DEPARTMENT_OTHER): Payer: Self-pay | Admitting: Student

## 2024-02-26 DIAGNOSIS — M47816 Spondylosis without myelopathy or radiculopathy, lumbar region: Secondary | ICD-10-CM | POA: Diagnosis not present

## 2024-02-26 DIAGNOSIS — M67959 Unspecified disorder of synovium and tendon, unspecified thigh: Secondary | ICD-10-CM | POA: Diagnosis not present

## 2024-02-26 DIAGNOSIS — M25552 Pain in left hip: Secondary | ICD-10-CM

## 2024-02-26 DIAGNOSIS — M48061 Spinal stenosis, lumbar region without neurogenic claudication: Secondary | ICD-10-CM | POA: Diagnosis not present

## 2024-02-26 DIAGNOSIS — M5136 Other intervertebral disc degeneration, lumbar region with discogenic back pain only: Secondary | ICD-10-CM | POA: Diagnosis not present

## 2024-02-26 DIAGNOSIS — M8580 Other specified disorders of bone density and structure, unspecified site: Secondary | ICD-10-CM | POA: Diagnosis not present

## 2024-02-26 DIAGNOSIS — M160 Bilateral primary osteoarthritis of hip: Secondary | ICD-10-CM | POA: Diagnosis not present

## 2024-02-26 MED ORDER — METHYLPREDNISOLONE 4 MG PO TBPK
ORAL_TABLET | ORAL | 0 refills | Status: DC
Start: 2024-02-26 — End: 2024-04-15
  Filled 2024-02-26: qty 21, 6d supply, fill #0

## 2024-02-26 NOTE — Progress Notes (Signed)
 Chief Complaint: Left hip pain    Discussed the use of AI scribe software for clinical note transcription with the patient, who gave verbal consent to proceed.  History of Present Illness Alyssa Fisher is a 58 year old female who presents with worsening left hip pain. She is accompanied by her son, Magdalene.  She has experienced left hip pain for several years, which began after a fall that resulted in an ankle injury requiring surgery. The pain became noticeable after discontinuing painkillers post-surgery. Over the past six to eight weeks, the pain has worsened, primarily located on the lateral aspect of the left hip, and is exacerbated by walking and ascending stairs. She describes a sensation of the hip 'popping' and occasionally feels as though it might give out, necessitating support from her son.  She has undergone multiple courses of physical therapy without significant improvement. In 2023, an MRI of the left hip revealed some tearing of the gluteus minimus tendon, and she received an injection, which provided limited relief. Additionally, she has had an SI joint injection and an ablation for lower back pain, which were more effective. She previously took ibuprofen  frequently for pain management but discontinued due to side effects. She has scoliosis in the lower spine. No radiation of pain down the leg.   Surgical History:   None  PMH/PSH/Family History/Social History/Meds/Allergies:    Past Medical History:  Diagnosis Date   Anxiety    Broken ankle    Chronic left-sided lumbar radiculopathy 01/05/2022   Contraceptive management 11/21/2012   Depression    Dizziness - light-headed/fainting 02/20/2011   Dysphagia 10/23/2012   due to thyroid  issues   Dysphagia 03/01/2011   Fatigue/Loss of Sleep 02/20/2011   FHx: allergies 02/20/2011   Ask patient to clarify. Only had allergies noted. Need more detail per medical history form dated 01/18/11.    Generalized headaches 02/20/2011   GERD (gastroesophageal reflux disease)    History of tonsillectomy    Hoarseness/sore throat 02/20/2011   Hypertension    Miscarriage    One - date not on medical form.   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   Rosacea    Runny nose 02/20/2011   Seasonal allergies    uses albuterol  inhaler as needed for allergies   Sinus problem 02/20/2011   Thyroid  goiter 10/23/2012   multinodular- has been followed x 35yrs   Wears glasses/contacts 02/20/2011   Past Surgical History:  Procedure Laterality Date   ABDOMINAL HYSTERECTOMY     ANTERIOR AND POSTERIOR REPAIR N/A 02/21/2017   Procedure: ANTERIOR (CYSTOCELE) AND POSTERIOR REPAIR (RECTOCELE);  Surgeon: Horacio Boas, MD;  Location: [facility];  Service: Gynecology;  Laterality: N/A;   BILATERAL SALPINGECTOMY N/A 02/21/2017   Procedure: BILATERAL SALPINGECTOMY;  Surgeon: Horacio Boas, MD;  Location: [facility];  Service: Gynecology;  Laterality: N/A;   BLADDER SUSPENSION N/A 02/21/2017   Procedure: Solyx Sling;  Surgeon: Horacio Boas, MD;  Location: [facility];  Service: Gynecology;  Laterality: N/A;   broken ankle Right    Pins/rod inserted-retained   COLONOSCOPY     DILATION AND CURETTAGE OF UTERUS  MAB   FRACTURE SURGERY     ORIF ANKLE FRACTURE Right 12/19/2018   Procedure: Removal of hardware and open reduction internal fixation of right ankle bimalleolar fracture;  Surgeon: Kit Rush, MD;  Location: [facility];  Service: Orthopedics;  Laterality: Right;  ok to open room per Tammy at [facility] Day   THYROIDECTOMY N/A 10/29/2012   Procedure: THYROIDECTOMY;  Surgeon: Krystal CHRISTELLA Spinner, MD;  Location: [facility];  Service: General;  Laterality: N/A;  Total Thyroidectomy   TONSILLECTOMY     VAGINAL  HYSTERECTOMY Bilateral 02/21/2017   Procedure: HYSTERECTOMY VAGINAL;  Surgeon: Horacio Krystal, MD;  Location: [facility];  Service: Gynecology;  Laterality: Bilateral;   WISDOM TOOTH EXTRACTION     Social History   Socioeconomic History   Marital status: Divorced    Spouse name: Not on file   Number of children: 5   Years of education: Not on file   Highest education level: Not on file  Occupational History   Not on file  Tobacco Use   Smoking status: Never   Smokeless tobacco: Never  Vaping Use   Vaping status: Never Used  Substance and Sexual Activity   Alcohol  use: Not Currently    Comment: rare -social   Drug use: No   Sexual activity: Not Currently    Birth control/protection: Abstinence, Condom, Post-menopausal  Other Topics Concern   Not on file  Social History Narrative   Not on file   Social Drivers of Health   Financial Resource Strain: Low Risk  (05/21/2023)   Overall Financial Resource Strain (CARDIA)    Difficulty of Paying Living Expenses: Not hard at all  Food Insecurity: Low Risk  (10/30/2023)   Received from [facility]   Hunger Vital Sign    Within the past 12 months, you worried that your food would run out before you got money to buy more: Never true    Within the past 12 months, the food you bought just didn't last and you didn't have money to get more. : Never true  Transportation Needs: No Transportation Needs (10/30/2023)   [facility]   Transportation    In the past 12 months, has lack of reliable transportation kept you from medical appointments, meetings, work or from getting things needed for daily living? : No  Physical Activity: Insufficiently Active (05/21/2023)   Exercise Vital Sign    Days of Exercise per Week: 3 days    Minutes of Exercise per Session: 30 min  Stress: No Stress Concern Present (05/21/2023)   [employer] of Occupational Health - Occupational Stress Questionnaire    Feeling of Stress : Not at all   Social Connections: Moderately Integrated (05/21/2023)   Social Connection and Isolation Panel    Frequency of Communication with Friends and Family: More than three times a week    Frequency of Social Gatherings with Friends and Family: More than three times a week    Attends Religious Services: More than 4 times per year    Active Member of [employer] or Organizations: Yes    Attends Banker Meetings: 1 to 4 times per year    Marital Status: Divorced   Family History  Problem Relation Age of Onset   Arthritis Mother    Cancer Mother    Heart disease Mother    Hypertension Mother    Kidney disease Mother    Obesity Mother    Heart disease Father    Alcohol   abuse Father    Vision loss Father    Colon polyps Maternal Grandmother    Allergies  Allergen Reactions   Augmentin  [Amoxicillin -Pot Clavulanate] Diarrhea   Codeine     Patient stated possible due to mother and grandparents being allergic as well. Per medical history form dated 01/18/11. Prefers not to have.   Current Outpatient Medications  Medication Sig Dispense Refill   methylPREDNISolone  (MEDROL  DOSEPAK) 4 MG TBPK tablet Take per packet instructions 21 tablet 0   ALPRAZolam  (XANAX ) 0.25 MG tablet Take 1 tablet (0.25 mg total) by mouth daily as needed for anxiety.     Azelastine  HCl 137 MCG/SPRAY SOLN PLACE 2 SPRAYS INTO BOTH NOSTRILS 2 (TWO) TIMES DAILY. USE IN EACH NOSTRIL AS DIRECTED 15 mL 7   cetirizine (ZYRTEC) 10 MG tablet Take 10 mg by mouth daily.     diazepam  (VALIUM ) 5 MG tablet Take 1 by mouth 1 hour  pre-procedure with very light food. May bring 2nd tablet to appointment. (Patient not taking: Reported on 07/09/2023) 2 tablet 0   DILT-XR 120 MG 24 hr capsule TAKE 1 CAPSULE BY MOUTH EVERY DAY 90 capsule 1   escitalopram  (LEXAPRO ) 10 MG tablet TAKE 2 TABLETS BY MOUTH EVERY DAY 180 tablet 1   fluticasone  (FLONASE ) 50 MCG/ACT nasal spray SPRAY 2 SPRAYS INTO EACH NOSTRIL EVERY DAY 48 mL 2   levothyroxine   (SYNTHROID ) 112 MCG tablet Take 1 tablet (112 mcg total) by mouth daily. 90 tablet 3   Multiple Vitamin (MULTIVITAMIN ADULT PO) Take by mouth.     omeprazole  (PRILOSEC) 20 MG capsule TAKE 1 CAPSULE BY MOUTH EVERY DAY 90 capsule 0   Semaglutide ,0.25 or 0.5MG /DOS, (OZEMPIC , 0.25 OR 0.5 MG/DOSE,) 2 MG/3ML SOPN Inject 0.5 mg into the skin once a week. 3 mL 2   No current facility-administered medications for this visit.   No results found.  Review of Systems:   A ROS was performed including pertinent positives and negatives as documented in the HPI.  Physical Exam :   Constitutional: NAD and appears stated age Neurological: Alert and oriented Psych: Appropriate affect and cooperative Last menstrual period 10/12/2012.   Comprehensive Musculoskeletal Exam:    Tenderness with palpation over the greater trochanter of the left hip.  Patient does ambulate with Trendelenburg gait.  Passive hip ROM range of motion is to 120 degrees flexion, 30 degrees external rotation, 20 degrees internal rotation.  Imaging:   Xray (lumbar spine 4 views): Scoliotic curvature seen in the AP view.  Overall well-maintained intervertebral disc spacing with normal lordotic alignment.  Some facet arthropathy noted at the L4-L5 and L5-S1 levels.  Otherwise minimal degenerative changes.  Xray (AP pelvis, left hip 3 views): Bilateral hip joint spacing is overall well-maintained.  Minimal spurring at the superior pubic symphysis.   I personally reviewed and interpreted the radiographs.      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 MRI in 2023 demonstrated a small tear of the gluteus tendon. Previous injections provided limited relief. Oral steroids are discussed for temporary relief, with further imaging or therapy considered if steroids are ineffective. Initiate a short course of oral Medrol . Follow up with Dr. B in 3-4 weeks to assess response and  evaluate further to determine if repeat imaging is necessary.     I personally saw and evaluated the patient, and participated in the management and treatment plan.  Leonce Reveal, PA-C Orthopedics

## 2024-03-10 ENCOUNTER — Other Ambulatory Visit

## 2024-03-10 DIAGNOSIS — E890 Postprocedural hypothyroidism: Secondary | ICD-10-CM | POA: Diagnosis not present

## 2024-03-11 LAB — TSH(REFL): TSH: 24.04 m[IU]/L — ABNORMAL HIGH (ref 0.40–4.50)

## 2024-03-11 LAB — REFLEX TIQ

## 2024-03-12 ENCOUNTER — Ambulatory Visit (HOSPITAL_BASED_OUTPATIENT_CLINIC_OR_DEPARTMENT_OTHER): Admitting: Orthopaedic Surgery

## 2024-03-13 ENCOUNTER — Encounter: Payer: Self-pay | Admitting: "Endocrinology

## 2024-03-13 ENCOUNTER — Ambulatory Visit (INDEPENDENT_AMBULATORY_CARE_PROVIDER_SITE_OTHER): Admitting: "Endocrinology

## 2024-03-13 VITALS — BP 130/84 | HR 74 | Ht 65.0 in | Wt 207.0 lb

## 2024-03-13 DIAGNOSIS — E890 Postprocedural hypothyroidism: Secondary | ICD-10-CM | POA: Diagnosis not present

## 2024-03-13 DIAGNOSIS — E66811 Obesity, class 1: Secondary | ICD-10-CM | POA: Diagnosis not present

## 2024-03-13 MED ORDER — LEVOTHYROXINE SODIUM 125 MCG PO TABS: 125.0000 ug | ORAL_TABLET | Freq: Every day | ORAL | 1 refills | Status: DC

## 2024-03-13 NOTE — Progress Notes (Signed)
 Outpatient Endocrinology Note Obadiah Birmingham, MD  03/13/24   Alyssa Fisher 07-28-1966 [id number]  Referring Provider: Lucius Krabbe, NP Primary Care Provider: Lucius Krabbe, NP Subjective  No chief complaint on file.   Assessment & Plan  Diagnoses and all orders for this visit:  Hypothyroidism, postsurgical  Obesity (BMI 30.0-34.9) -     Cancel: US  THYROID ; Future -     Lipid panel  Other orders -     levothyroxine  (SYNTHROID ) 125 MCG tablet; Take 1 tablet (125 mcg total) by mouth daily.   Alyssa Fisher is currently taking levothyroxine  59 mcg once a day, takes appropriately. S/p total thyroidectomy in 2014 for thyroid  nodules, no cancer per pt, unable to find final pathology  Patient is currently biochemically hyperthyroid.  Educated on thyroid  axis.  Recommend the following: Take levothyroxine  125 mcg seven days a week. Advised to take levothyroxine  first thing in the morning on empty stomach and wait at least 30 minutes to 1 hour before eating or drinking anything or taking any other medications. Space out levothyroxine  by 4 hours from any acid reflux medication/fibrate/iron/calcium /multivitamin. Advised to take birth control pills and nutritional supplements in the evening. Repeat lab before next visit or sooner if symptoms of hyperthyroidism or hypothyroidism develop.  Notify us  immediately in case of significant weight gain or loss. Counseled on compliance and follow up needs.  Pt interested in weight loss Discussed lifestyle changes and medical management  Maintain healthy lifestyle including 30 min of activity/day, avoiding refined/processed/outside food 20 minutes physical activity per day, in continuum or interruptedly through the day  Goals: less than 60 grams of carbohydrate/meal, 1200-1500 Cal/day, 10,0000 steps a day and weight loss of 0.5-1 lb/ wk  Sleep 7-9 hours/day, adapt good sleep hygiene Adapt de-stressing and relaxation techniques to  prevent stress induced weight gain Avoid/switch medications that lead to weight gain by discussing with the prescribing physician  Been on ozempic  for a while, couldn't tolerate dose higher dose No history of MEN syndrome/medullary thyroid  cancer/pancreatitis or pancreatic cancer in self or family, s/p total thyroidectomy  Ordered lipid panel to r/o high TG, will start with mounjaro/trulicity then per patient preference   Return in about 4 weeks (around 04/10/2024) for visit, 8 am labs this week.   Obadiah Birmingham, MD  03/13/24   I have reviewed current medications, nurse's notes, allergies, vital signs, past medical and surgical history, family medical history, and social history for this encounter. Counseled patient on symptoms, examination findings, lab findings, imaging results, treatment decisions and monitoring and prognosis. The patient understood the recommendations and agrees with the treatment plan. All questions regarding treatment plan were fully answered.   History of Present Illness Alyssa Fisher is a 58 y.o. year old female who presents to our clinic with postsurgical diagnosed in 2014.    On levothyroxine  112 mcg every morning 7 days/week, takes appropriately  Reports anxiety is well controlled Feels depressed about her weight gain Gained about 19 lbs in past 3-4 mo-denies any lifestyle changes   Initial history: C/o dry hair with hair fall, still not sleeping well, not feeling good overall Has improvement in hyperdefecation and heat intolerance No weight loss Has anxiety-  Compressive symptoms:  dysphagia  No dysphonia  No positional dyspnea (especially with simultaneous arms elevation)  No  Smokes  No On biotin  No Personal history of head/neck surgery/irradiation  Yes, total thyroidectomy in 2014 for thyroid  nodules, no cancer per pt, unable to find final pathology  report   Physical Exam  BP 130/84   Pulse 74   Ht 5' 5 (1.651 m)   Wt 207 lb (93.9 kg)   LMP  10/12/2012   SpO2 95%   BMI 34.45 kg/m  Constitutional: well developed, well nourished Head: normocephalic, atraumatic, no exophthalmos Eyes: sclera anicteric, no redness Neck: thyroidectomy scar Lungs: normal respiratory effort Neurology: alert and oriented, no fine hand tremor Skin: dry, no appreciable rashes Musculoskeletal: no appreciable defects Psychiatric: normal mood and affect  Allergies Allergies  Allergen Reactions   Augmentin  [Amoxicillin -Pot Clavulanate] Diarrhea   Codeine     Patient stated possible due to mother and grandparents being allergic as well. Per medical history form dated 01/18/11. Prefers not to have.    Current Medications Patient's Medications  New Prescriptions   LEVOTHYROXINE  (SYNTHROID ) 125 MCG TABLET    Take 1 tablet (125 mcg total) by mouth daily.  Previous Medications   ALPRAZOLAM  (XANAX ) 0.25 MG TABLET    Take 1 tablet (0.25 mg total) by mouth daily as needed for anxiety.   AZELASTINE  HCL 137 MCG/SPRAY SOLN    PLACE 2 SPRAYS INTO BOTH NOSTRILS 2 (TWO) TIMES DAILY. USE IN EACH NOSTRIL AS DIRECTED   CETIRIZINE (ZYRTEC) 10 MG TABLET    Take 10 mg by mouth daily.   DIAZEPAM  (VALIUM ) 5 MG TABLET    Take 1 by mouth 1 hour  pre-procedure with very light food. May bring 2nd tablet to appointment.   DILT-XR 120 MG 24 HR CAPSULE    TAKE 1 CAPSULE BY MOUTH EVERY DAY   ESCITALOPRAM  (LEXAPRO ) 10 MG TABLET    TAKE 2 TABLETS BY MOUTH EVERY DAY   FLUTICASONE  (FLONASE ) 50 MCG/ACT NASAL SPRAY    SPRAY 2 SPRAYS INTO EACH NOSTRIL EVERY DAY   METHYLPREDNISOLONE  (MEDROL  DOSEPAK) 4 MG TBPK TABLET    Take per packet instructions   MULTIPLE VITAMIN (MULTIVITAMIN ADULT PO)    Take by mouth.   OMEPRAZOLE  (PRILOSEC) 20 MG CAPSULE    TAKE 1 CAPSULE BY MOUTH EVERY DAY   SEMAGLUTIDE ,0.25 OR 0.5MG /DOS, (OZEMPIC , 0.25 OR 0.5 MG/DOSE,) 2 MG/3ML SOPN    Inject 0.5 mg into the skin once a week.  Modified Medications   No medications on file  Discontinued Medications    LEVOTHYROXINE  (SYNTHROID ) 112 MCG TABLET    Take 1 tablet (112 mcg total) by mouth daily.    Past Medical History Past Medical History:  Diagnosis Date   Anxiety    Broken ankle    Chronic left-sided lumbar radiculopathy 01/05/2022   Contraceptive management 11/21/2012   Depression    Dizziness - light-headed/fainting 02/20/2011   Dysphagia 10/23/2012   due to thyroid  issues   Dysphagia 03/01/2011   Fatigue/Loss of Sleep 02/20/2011   FHx: allergies 02/20/2011   Ask patient to clarify. Only had allergies noted. Need more detail per medical history form dated 01/18/11.   Generalized headaches 02/20/2011   GERD (gastroesophageal reflux disease)    History of tonsillectomy    Hoarseness/sore throat 02/20/2011   Hypertension    Miscarriage    One - date not on medical form.   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   Rosacea    Runny nose 02/20/2011   Seasonal allergies    uses albuterol  inhaler as needed for allergies  Sinus problem 02/20/2011   Thyroid  goiter 10/23/2012   multinodular- has been followed x 52yrs   Wears glasses/contacts 02/20/2011    Past Surgical History Past Surgical History:  Procedure Laterality Date   ABDOMINAL HYSTERECTOMY     ANTERIOR AND POSTERIOR REPAIR N/A 02/21/2017   Procedure: ANTERIOR (CYSTOCELE) AND POSTERIOR REPAIR (RECTOCELE);  Surgeon: Horacio Boas, MD;  Location: [facility];  Service: Gynecology;  Laterality: N/A;   BILATERAL SALPINGECTOMY N/A 02/21/2017   Procedure: BILATERAL SALPINGECTOMY;  Surgeon: Horacio Boas, MD;  Location: [facility];  Service: Gynecology;  Laterality: N/A;   BLADDER SUSPENSION N/A 02/21/2017   Procedure: Solyx Sling;  Surgeon: Horacio Boas, MD;  Location: [facility];  Service: Gynecology;  Laterality: N/A;   broken ankle Right     Pins/rod inserted-retained   COLONOSCOPY     DILATION AND CURETTAGE OF UTERUS     MAB   FRACTURE SURGERY     ORIF ANKLE FRACTURE Right 12/19/2018   Procedure: Removal of hardware and open reduction internal fixation of right ankle bimalleolar fracture;  Surgeon: Kit Rush, MD;  Location: [facility];  Service: Orthopedics;  Laterality: Right;  ok to open room per [facility] at [facility]   THYROIDECTOMY N/A 10/29/2012   Procedure: THYROIDECTOMY;  Surgeon: Boas CHRISTELLA Spinner, MD;  Location: [facility];  Service: General;  Laterality: N/A;  Total Thyroidectomy   TONSILLECTOMY     VAGINAL HYSTERECTOMY Bilateral 02/21/2017   Procedure: HYSTERECTOMY VAGINAL;  Surgeon: Horacio Boas, MD;  Location: [facility];  Service: Gynecology;  Laterality: Bilateral;   WISDOM TOOTH EXTRACTION      Family History family history includes Alcohol  abuse in her father; Arthritis in her mother; Cancer in her mother; Colon polyps in her maternal grandmother; Heart disease in her father and mother; Hypertension in her mother; Kidney disease in her mother; Obesity in her mother; Vision loss in her father.  Social History Social History   Socioeconomic History   Marital status: Divorced    Spouse name: Not on file   Number of children: 5   Years of education: Not on file   Highest education level: Not on file  Occupational History   Not on file  Tobacco Use   Smoking status: Never   Smokeless tobacco: Never  Vaping Use   Vaping status: Never Used  Substance and Sexual Activity   Alcohol  use: Not Currently    Comment: rare -social   Drug use: No   Sexual activity: Not Currently    Birth control/protection: Abstinence, Condom, Post-menopausal  Other Topics Concern   Not on file  Social History Narrative   Not on file   Social Drivers of Health   Financial Resource Strain: Low Risk  (05/21/2023)   Overall Financial Resource Strain (CARDIA)    Difficulty of Paying Living Expenses: Not hard  at all  Food Insecurity: Low Risk  (10/30/2023)   Received from [facility]   Hunger Vital Sign    Within the past 12 months, you worried that your food would run out before you got money to buy more: Never true    Within the past 12 months, the food you bought just didn't last and you didn't have money to get more. : Never true  Transportation Needs: No Transportation Needs (10/30/2023)   Received from [facility]    In the past 12 months, has lack of reliable transportation kept you from medical appointments, meetings, work or from getting things needed  for daily living? : No  Physical Activity: Insufficiently Active (05/21/2023)   Exercise Vital Sign    Days of Exercise per Week: 3 days    Minutes of Exercise per Session: 30 min  Stress: No Stress Concern Present (05/21/2023)   [employer] of Occupational Health - Occupational Stress Questionnaire    Feeling of Stress : Not at all  Social Connections: Moderately Integrated (05/21/2023)   Social Connection and Isolation Panel    Frequency of Communication with Friends and Family: More than three times a week    Frequency of Social Gatherings with Friends and Family: More than three times a week    Attends Religious Services: More than 4 times per year    Active Member of [employer] or Organizations: Yes    Attends Banker Meetings: 1 to 4 times per year    Marital Status: Divorced  Intimate Partner Violence: Not At Risk (05/21/2023)   Humiliation, Afraid, Rape, and Fisher questionnaire    Fear of Current or Ex-Partner: No    Emotionally Abused: No    Physically Abused: No    Sexually Abused: No    Laboratory Investigations Lab Results  Component Value Date   TSH 0.07 (L) 11/15/2023   TSH 0.01 Repeated and verified X2. (L) 07/04/2023   TSH 0.05 (L) 06/21/2023   FREET4 1.0 11/15/2023   FREET4 2.1 (H) 10/02/2023   FREET4 1.33 07/04/2023     No results found for: TSI   No components found for:  TRAB   Lab Results  Component Value Date   CHOL 197 05/02/2022   Lab Results  Component Value Date   HDL 49.80 05/02/2022   Lab Results  Component Value Date   LDLCALC 123 (H) 05/02/2022   Lab Results  Component Value Date   TRIG 122.0 05/02/2022   Lab Results  Component Value Date   CHOLHDL 4 05/02/2022   Lab Results  Component Value Date   CREATININE 0.73 10/30/2012   No results found for: GFR    Component Value Date/Time   NA 134 (L) 10/30/2012 0426   K 3.9 10/30/2012 0426   CL 100 10/30/2012 0426   CO2 26 10/30/2012 0426   GLUCOSE 123 (H) 10/30/2012 0426   BUN 7 10/30/2012 0426   CREATININE 0.73 10/30/2012 0426   CALCIUM  8.2 (L) 10/30/2012 0426   GFRNONAA >90 10/30/2012 0426   GFRAA >90 10/30/2012 0426      Latest Ref Rng & Units 10/30/2012    4:26 AM  BMP  Glucose 70 - 99 mg/dL 876   BUN 6 - 23 mg/dL 7   Creatinine 9.49 - 8.89 mg/dL 9.26   Sodium 864 - 854 mEq/L 134   Potassium 3.5 - 5.1 mEq/L 3.9   Chloride 96 - 112 mEq/L 100   CO2 19 - 32 mEq/L 26   Calcium  8.4 - 10.5 mg/dL 8.2        Component Value Date/Time   WBC 10.4 02/22/2017 0630   RBC 3.62 (L) 02/22/2017 0630   HGB 10.2 (L) 02/22/2017 0630   HCT 30.6 (L) 02/22/2017 0630   PLT 214 02/22/2017 0630   MCV 84.5 02/22/2017 0630   MCH 28.2 02/22/2017 0630   MCHC 33.3 02/22/2017 0630   RDW 14.0 02/22/2017 0630   LYMPHSABS 2.6 02/13/2017 1345   MONOABS 0.2 02/13/2017 1345   EOSABS 0.2 02/13/2017 1345   BASOSABS 0.0 02/13/2017 1345      Parts of this note may have been  dictated using voice recognition software. There may be variances in spelling and vocabulary which are unintentional. Not all errors are proofread. Please notify the dino if any discrepancies are noted or if the meaning of any statement is not clear.

## 2024-03-14 ENCOUNTER — Other Ambulatory Visit

## 2024-03-15 LAB — LIPID PANEL
Cholesterol: 209 mg/dL — ABNORMAL HIGH (ref <200)
HDL: 54 mg/dL (ref >=50)
LDL Cholesterol (Calc): 136 mg/dL — ABNORMAL HIGH
Non-HDL Cholesterol (Calc): 155 mg/dL — ABNORMAL HIGH (ref <130)
Total CHOL/HDL Ratio: 3.9 (calc) (ref <5.0)
Triglycerides: 87 mg/dL (ref <150)

## 2024-03-18 ENCOUNTER — Other Ambulatory Visit: Payer: Self-pay | Admitting: "Endocrinology

## 2024-03-18 ENCOUNTER — Ambulatory Visit: Payer: Self-pay | Admitting: "Endocrinology

## 2024-03-18 MED ORDER — ZEPBOUND 2.5 MG/0.5ML ~~LOC~~ SOAJ: 2.5000 mg | SUBCUTANEOUS | 0 refills | Status: DC

## 2024-03-18 NOTE — Progress Notes (Signed)
 Alyssa Fisher

## 2024-03-19 ENCOUNTER — Ambulatory Visit (HOSPITAL_BASED_OUTPATIENT_CLINIC_OR_DEPARTMENT_OTHER): Admitting: Orthopaedic Surgery

## 2024-03-19 ENCOUNTER — Telehealth: Payer: Self-pay

## 2024-03-19 ENCOUNTER — Other Ambulatory Visit (HOSPITAL_COMMUNITY): Payer: Self-pay

## 2024-03-19 DIAGNOSIS — J342 Deviated nasal septum: Secondary | ICD-10-CM | POA: Diagnosis not present

## 2024-03-19 DIAGNOSIS — J3489 Other specified disorders of nose and nasal sinuses: Secondary | ICD-10-CM | POA: Diagnosis not present

## 2024-03-19 NOTE — Telephone Encounter (Signed)
 Pharmacy Patient Advocate Encounter   Received notification from Patient Advice Request messages that prior authorization for Zepbound  2.5mg  is required/requested.   Insurance verification completed.   The patient is insured through [facility] .   Per test claim: PA required; PA started via CoverMyMeds. KEY [id number] . Please see clinical question(s) below that I am not finding the answer to in their chart and advise.     Her insurance will not cover for weight loss. Only if she has obstructive sleep apnea, which I do not see evidence of in her chart.

## 2024-03-20 ENCOUNTER — Other Ambulatory Visit: Payer: Self-pay | Admitting: "Endocrinology

## 2024-03-20 DIAGNOSIS — E66811 Obesity, class 1: Secondary | ICD-10-CM

## 2024-03-20 MED ORDER — OZEMPIC (0.25 OR 0.5 MG/DOSE) 2 MG/3ML ~~LOC~~ SOPN: 0.2500 mg | PEN_INJECTOR | SUBCUTANEOUS | 1 refills | Status: DC

## 2024-03-24 NOTE — Telephone Encounter (Signed)
 Ozempic  is only approved for type 2 diabetes. It will not be approved for Obesity. Unfortunately, it does not appear that her insurance is going to cover a GLP-1 at this time as she does not qualify for Mounjaro or Ozempic , and they will not cover Wegovy  or Zepbound  since they will not cover weight loss medications.

## 2024-03-31 DIAGNOSIS — E890 Postprocedural hypothyroidism: Secondary | ICD-10-CM | POA: Diagnosis not present

## 2024-03-31 DIAGNOSIS — J342 Deviated nasal septum: Secondary | ICD-10-CM | POA: Diagnosis not present

## 2024-03-31 DIAGNOSIS — I341 Nonrheumatic mitral (valve) prolapse: Secondary | ICD-10-CM | POA: Diagnosis not present

## 2024-03-31 DIAGNOSIS — I10 Essential (primary) hypertension: Secondary | ICD-10-CM | POA: Diagnosis not present

## 2024-03-31 DIAGNOSIS — J343 Hypertrophy of nasal turbinates: Secondary | ICD-10-CM | POA: Diagnosis not present

## 2024-03-31 DIAGNOSIS — J348200 Internal nasal valve collapse, unspecified: Secondary | ICD-10-CM | POA: Diagnosis not present

## 2024-03-31 DIAGNOSIS — J3489 Other specified disorders of nose and nasal sinuses: Secondary | ICD-10-CM | POA: Diagnosis not present

## 2024-04-08 DIAGNOSIS — Z48813 Encounter for surgical aftercare following surgery on the respiratory system: Secondary | ICD-10-CM | POA: Diagnosis not present

## 2024-04-14 ENCOUNTER — Other Ambulatory Visit

## 2024-04-14 ENCOUNTER — Other Ambulatory Visit: Payer: Self-pay | Admitting: "Endocrinology

## 2024-04-14 DIAGNOSIS — E890 Postprocedural hypothyroidism: Secondary | ICD-10-CM

## 2024-04-15 ENCOUNTER — Encounter: Payer: Self-pay | Admitting: Family

## 2024-04-15 ENCOUNTER — Ambulatory Visit: Admitting: Family

## 2024-04-15 ENCOUNTER — Other Ambulatory Visit (HOSPITAL_COMMUNITY): Payer: Self-pay

## 2024-04-15 VITALS — BP 128/82 | HR 79 | Temp 97.9°F | Ht 65.0 in | Wt 203.4 lb

## 2024-04-15 DIAGNOSIS — E782 Mixed hyperlipidemia: Secondary | ICD-10-CM | POA: Diagnosis not present

## 2024-04-15 DIAGNOSIS — F419 Anxiety disorder, unspecified: Secondary | ICD-10-CM | POA: Diagnosis not present

## 2024-04-15 DIAGNOSIS — E890 Postprocedural hypothyroidism: Secondary | ICD-10-CM

## 2024-04-15 DIAGNOSIS — R7303 Prediabetes: Secondary | ICD-10-CM | POA: Diagnosis not present

## 2024-04-15 DIAGNOSIS — I10 Essential (primary) hypertension: Secondary | ICD-10-CM

## 2024-04-15 DIAGNOSIS — K219 Gastro-esophageal reflux disease without esophagitis: Secondary | ICD-10-CM

## 2024-04-15 DIAGNOSIS — E669 Obesity, unspecified: Secondary | ICD-10-CM

## 2024-04-15 DIAGNOSIS — R635 Abnormal weight gain: Secondary | ICD-10-CM | POA: Diagnosis not present

## 2024-04-15 LAB — COMPREHENSIVE METABOLIC PANEL WITH GFR
ALT: 13 U/L (ref 0–35)
AST: 16 U/L (ref 0–37)
Albumin: 3.5 g/dL (ref 3.5–5.2)
Alkaline Phosphatase: 80 U/L (ref 39–117)
BUN: 5 mg/dL — ABNORMAL LOW (ref 6–23)
CO2: 27 meq/L (ref 19–32)
Calcium: 8.6 mg/dL (ref 8.4–10.5)
Chloride: 102 meq/L (ref 96–112)
Creatinine, Ser: 0.74 mg/dL (ref 0.40–1.20)
GFR: 88.54 mL/min (ref >60.00)
Glucose, Bld: 104 mg/dL — ABNORMAL HIGH (ref 70–99)
Potassium: 3.1 meq/L — ABNORMAL LOW (ref 3.5–5.1)
Sodium: 138 meq/L (ref 135–145)
Total Bilirubin: 0.3 mg/dL (ref 0.2–1.2)
Total Protein: 7.1 g/dL (ref 6.0–8.3)

## 2024-04-15 LAB — POCT GLYCOSYLATED HEMOGLOBIN (HGB A1C): Hemoglobin A1C: 5.8 % — AB (ref 4.0–5.6)

## 2024-04-15 MED ORDER — DILTIAZEM HCL ER 120 MG PO CP24: 120.0000 mg | ORAL_CAPSULE | Freq: Every day | ORAL | 1 refills | Status: AC

## 2024-04-15 MED ORDER — ESCITALOPRAM OXALATE 10 MG PO TABS: 10.0000 mg | ORAL_TABLET | Freq: Every day | ORAL | 1 refills | Status: DC

## 2024-04-15 MED ORDER — OMEPRAZOLE 20 MG PO CPDR: 20.0000 mg | DELAYED_RELEASE_CAPSULE | Freq: Every day | ORAL | 1 refills | Status: AC

## 2024-04-15 MED ORDER — OZEMPIC (0.25 OR 0.5 MG/DOSE) 2 MG/3ML ~~LOC~~ SOPN: 0.2500 mg | PEN_INJECTOR | SUBCUTANEOUS | 1 refills | Status: DC

## 2024-04-15 NOTE — Assessment & Plan Note (Signed)
 Discussed challenges with insurance coverage for weight loss medications. She is interested in resuming semaglutide  (Ozempic ) and considered alternatives like Wegovy . Emphasized weight loss through diet and exercise. - Send prescription for Ozempic  0.25mg  qweek and attempt prior authorization with insurance. - Encourage weight loss through increased exercise once cleared post nasal surgery. - Wt. Loss strategies reviewed including portion control, less carbs including sweets, eating most of calories earlier in day, drinking 64oz water  qd, and establishing daily exercise routine.

## 2024-04-15 NOTE — Assessment & Plan Note (Signed)
 Cholesterol levels slightly elevated at ENDO office visit, risk assessment under 5%, medication not necessary at this time. Actively trying to lose weight. Discussed dietary modifications. - Advise dietary modifications, including reducing red meat and high-fat dairy intake. - Encourage exercise once cleared post-surgery. - F/U in 1 year

## 2024-04-15 NOTE — Assessment & Plan Note (Addendum)
 Now followed by Endocrinology - Takes Synthroid  125 mcg daily, dose up & down due to TSH fluctuation - Recent TSH level elevated at 24 - Adheres to medication regimen without interference from food or other medications

## 2024-04-15 NOTE — Assessment & Plan Note (Signed)
 Blood pressure well-controlled. Discussed home monitoring, especially with symptoms. - Refill diltiazem  (Cardizem ) 120mg  qd, sending refill. - Encourage home blood pressure monitoring, especially if having symptoms. - Notify office of readings > 140/90 either number - F/U in 6 mos

## 2024-04-15 NOTE — Assessment & Plan Note (Signed)
 Managed with Lexapro  10mg  qd. Denies SE, dose controlling symptoms. - Continue Lexapro  for depression and anxiety, sending refill. - F/U in 6 mos

## 2024-04-15 NOTE — Patient Instructions (Signed)
 It was very nice to see you today!   I have sent over all of your refills. Your A1C today is 5.8 which is prediabetes.  Like we talked about, avoid white carbs, eat fiber rich carbs earlier in the day, avoid processed foods and sweets, limit whole fruit or juice to 2-3 servings per day. Get back to your exercise routine as soon as able per your ENT's orders. We will call you if the Ozempic  is covered.  Schedule a 6 month follow up visit today.      PLEASE NOTE:  If you had any lab tests please let us  know if you have not heard back within a few days. You may see your results on MyChart before we have a chance to review them but we will give you a call once they are reviewed by us . If we ordered any referrals today, please let us  know if you have not heard from their office within the next week.

## 2024-04-15 NOTE — Progress Notes (Signed)
 Patient ID: Alyssa Fisher, female    DOB: 12-14-1965, 58 y.o.   MRN: [id number]  Chief Complaint  Patient presents with   Hypertension   Weight Gain  Discussed the use of AI scribe software for clinical note transcription with the patient, who gave verbal consent to proceed.  History of Present Illness Alyssa Fisher is a 58 year old female with hypothyroidism and obesity who presents for medication management and follow-up.  Thyroid  dysfunction - Now seeing Endocrinology for hypothyroidism - Takes Synthroid  125 mcg daily - Recent TSH level elevated at 24, has been fluctuating - Adheres to medication regimen without interference from food or other medications  Weight gain and obesity - Significant weight gain over recent period - Previously used Ozempic  for weight loss but discontinued due to insurance issues - Interested in resuming Ozempic  therapy - Recent surgery and breathing problems have limited ability to exercise - Increased water  intake, reduced coffee consumption, but continues to drink soda in the mornings  Postoperative recovery (septoplasty and turbinoplasty) - Underwent septoplasty and turbinoplasty two weeks ago - Breathing has improved since surgery - Experienced postoperative swelling and periorbital ecchymosis (black eyes) - No recent nasal bleeding - Has not resumed exercise due to recent surgery and breathing issues  Mood and anxiety symptoms - Takes Lexapro  10 mg daily for anxiety and depression  Gastroesophageal reflux symptoms - Takes Prilosec for GERD  Allergic rhinitis - Uses Flonase  for nasal symptoms  Assessment & Plan Obesity Discussed challenges with insurance coverage for weight loss medications. She is interested in resuming semaglutide  (Ozempic ) and considered alternatives like Wegovy . Emphasized weight loss through diet and exercise. - Send prescription for Ozempic  0.25mg  qweek and attempt prior authorization with insurance. - Encourage  weight loss through increased exercise once cleared post nasal surgery. - Wt. Loss strategies reviewed including portion control, less carbs including sweets, eating most of calories earlier in day, drinking 64oz water  qd, and establishing daily exercise routine.  Prediabetes A1c is 5.8% today, indicating prediabetes. Discussed weight loss and dietary modifications, emphasizing carbohydrate management. - Advise dietary modifications focusing on reducing white carbohydrates and increasing whole grains, increase protein intake, discussed portion control, increased water  intake, eliminate sodas, limit whole fruit & juice to 2-3 servings per day. - Restarting Ozempic  which will help lower blood sugar if covered. - Encourage weight loss to manage prediabetes. - F/U in 6 mos or prn  Hyperlipidemia Cholesterol levels slightly elevated at ENDO office visit, risk assessment under 5%, medication not necessary at this time. Actively trying to lose weight. Discussed dietary modifications. - Advise dietary modifications, including reducing red meat and high-fat dairy intake. - Encourage exercise once cleared post-surgery. - F/U in 1 year  Gastroesophageal reflux disease (GERD) GERD managed with omeprazole . Denies SE, no symptoms. - Continue omeprazole  once daily, sending refill. - F/U in 6 mos  Hypertension Blood pressure well-controlled. Discussed home monitoring, especially with symptoms. - Refill diltiazem  (Cardizem ) 120mg  qd, sending refill. - Encourage home blood pressure monitoring, especially if having symptoms. - Notify office of readings > 140/90 either number - F/U in 6 mos  Depression and anxiety Managed with Lexapro  10mg  qd. Denies SE, dose controlling symptoms. - Continue Lexapro  for depression and anxiety, sending refill. - F/U in 6 mos  Status post septoplasty and turbinate reduction Post-operative recovery ongoing. Breathing improved. Advised to avoid exercise until fully  healed. - Advise avoiding exercise until fully healed post-surgery.   Subjective:    Outpatient Medications Prior to Visit  Medication Sig Dispense Refill   ALPRAZolam  (XANAX ) 0.25 MG tablet Take 1 tablet (0.25 mg total) by mouth daily as needed for anxiety.     cetirizine (ZYRTEC) 10 MG tablet Take 10 mg by mouth daily.     DILT-XR 120 MG 24 hr capsule TAKE 1 CAPSULE BY MOUTH EVERY DAY 90 capsule 1   escitalopram  (LEXAPRO ) 10 MG tablet TAKE 2 TABLETS BY MOUTH EVERY DAY 180 tablet 1   levothyroxine  (SYNTHROID ) 125 MCG tablet Take 1 tablet (125 mcg total) by mouth daily. 90 tablet 1   Multiple Vitamin (MULTIVITAMIN ADULT PO) Take by mouth.     omeprazole  (PRILOSEC) 20 MG capsule TAKE 1 CAPSULE BY MOUTH EVERY DAY 90 capsule 0   Azelastine  HCl 137 MCG/SPRAY SOLN PLACE 2 SPRAYS INTO BOTH NOSTRILS 2 (TWO) TIMES DAILY. USE IN EACH NOSTRIL AS DIRECTED 15 mL 7   diazepam  (VALIUM ) 5 MG tablet Take 1 by mouth 1 hour  pre-procedure with very light food. May bring 2nd tablet to appointment. (Patient not taking: Reported on 07/09/2023) 2 tablet 0   fluticasone  (FLONASE ) 50 MCG/ACT nasal spray SPRAY 2 SPRAYS INTO EACH NOSTRIL EVERY DAY 48 mL 2   methylPREDNISolone  (MEDROL  DOSEPAK) 4 MG TBPK tablet Take per packet instructions 21 tablet 0   Semaglutide ,0.25 or 0.5MG /DOS, (OZEMPIC , 0.25 OR 0.5 MG/DOSE,) 2 MG/3ML SOPN Inject 0.25 mg into the skin once a week. 0.25 mg weekly for the first 4 weeks followed by 0.5 mg weekly if well-tolerated (Patient not taking: Reported on 04/15/2024) 3 mL 1   No facility-administered medications prior to visit.   Past Medical History:  Diagnosis Date   Anxiety    Broken ankle    Chronic left-sided lumbar radiculopathy 01/05/2022   Contraceptive management 11/21/2012   Depression    Dizziness - light-headed/fainting 02/20/2011   Dysphagia 10/23/2012   due to thyroid  issues   Dysphagia 03/01/2011   Fatigue/Loss of Sleep 02/20/2011   FHx: allergies 02/20/2011   Ask  patient to clarify. Only had allergies noted. Need more detail per medical history form dated 01/18/11.   Generalized headaches 02/20/2011   GERD (gastroesophageal reflux disease)    History of tonsillectomy    Hoarseness/sore throat 02/20/2011   Hypertension    Miscarriage    One - date not on medical form.   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   Rosacea    Runny nose 02/20/2011   Seasonal allergies    uses albuterol  inhaler as needed for allergies   Sinus problem 02/20/2011   Thyroid  goiter 10/23/2012   multinodular- has been followed x 62yrs   Wears glasses/contacts 02/20/2011   Past Surgical History:  Procedure Laterality Date   ABDOMINAL HYSTERECTOMY     ANTERIOR AND POSTERIOR REPAIR N/A 02/21/2017   Procedure: ANTERIOR (CYSTOCELE) AND POSTERIOR REPAIR (RECTOCELE);  Surgeon: Horacio Boas, MD;  Location: [facility];  Service: Gynecology;  Laterality: N/A;   BILATERAL SALPINGECTOMY N/A 02/21/2017   Procedure: BILATERAL SALPINGECTOMY;  Surgeon: Horacio Boas, MD;  Location: [facility];  Service: Gynecology;  Laterality: N/A;   BLADDER SUSPENSION N/A 02/21/2017   Procedure: Solyx Sling;  Surgeon: Horacio Boas, MD;  Location: [facility];  Service: Gynecology;  Laterality: N/A;   broken ankle Right    Pins/rod inserted-retained   COLONOSCOPY  DILATION AND CURETTAGE OF UTERUS     MAB   FRACTURE SURGERY     ORIF ANKLE FRACTURE Right 12/19/2018   Procedure: Removal of hardware and open reduction internal fixation of right ankle bimalleolar fracture;  Surgeon: Kit Rush, MD;  Location: [facility];  Service: Orthopedics;  Laterality: Right;  ok to open room per Tammy at [facility]   SEPTOPLASTY     THYROIDECTOMY N/A 10/29/2012   Procedure: THYROIDECTOMY;  Surgeon: Krystal CHRISTELLA Spinner, MD;  Location: [facility];  Service: General;  Laterality: N/A;  Total Thyroidectomy   TONSILLECTOMY     VAGINAL HYSTERECTOMY Bilateral 02/21/2017   Procedure: HYSTERECTOMY VAGINAL;  Surgeon: Horacio Krystal, MD;  Location: [facility];  Service: Gynecology;  Laterality: Bilateral;   WISDOM TOOTH EXTRACTION     Allergies  Allergen Reactions   Augmentin  [Amoxicillin -Pot Clavulanate] Diarrhea   Bupropion Other (See Comments)    Speech sx- stuttering   Codeine     Patient stated possible due to mother and grandparents being allergic as well. Per medical history form dated 01/18/11. Prefers not to have.      Objective:    Physical Exam Vitals and nursing note reviewed.  Constitutional:      Appearance: Normal appearance. She is obese.  Cardiovascular:     Rate and Rhythm: Normal rate and regular rhythm.  Pulmonary:     Effort: Pulmonary effort is normal.     Breath sounds: Normal breath sounds.  Musculoskeletal:        General: Normal range of motion.  Skin:    General: Skin is warm and dry.  Neurological:     Mental Status: She is alert.  Psychiatric:        Mood and Affect: Mood normal.        Behavior: Behavior normal.    BP 128/82 (BP Location: Left Arm, Patient Position: Sitting, Cuff Size: Large)   Pulse 79   Temp 97.9 F (36.6 C) (Temporal)   Ht 5' 5 (1.651 m)   Wt 203 lb 6.4 oz (92.3 kg)   LMP 10/12/2012   SpO2 98%   BMI 33.85 kg/m  Wt Readings from Last 3 Encounters:  04/15/24 203 lb 6.4 oz (92.3 kg)  03/13/24 207 lb (93.9 kg)  11/20/23 189 lb (85.7 kg)      Lucius Krabbe, NP

## 2024-04-15 NOTE — Assessment & Plan Note (Signed)
 A1c is 5.8% today, indicating prediabetes. Discussed weight loss and dietary modifications, emphasizing carbohydrate management. - Advise dietary modifications focusing on reducing white carbohydrates and increasing whole grains, increase protein intake, discussed portion control, increased water  intake, eliminate sodas, limit whole fruit & juice to 2-3 servings per day. - Restarting Ozempic  which will help lower blood sugar if covered. - Encourage weight loss to manage prediabetes. - F/U in 6 mos or prn

## 2024-04-16 ENCOUNTER — Ambulatory Visit: Admitting: "Endocrinology

## 2024-04-17 ENCOUNTER — Ambulatory Visit: Payer: Self-pay | Admitting: Family

## 2024-04-17 DIAGNOSIS — I10 Essential (primary) hypertension: Secondary | ICD-10-CM

## 2024-04-17 NOTE — Progress Notes (Signed)
 Call & schedule a non-fasting, lab only appt for repeat BMP asap, & place future order, thx.

## 2024-04-18 ENCOUNTER — Telehealth: Payer: Self-pay

## 2024-04-18 NOTE — Telephone Encounter (Signed)
-----   Message from Lucius Krabbe sent at 04/17/2024  5:40 PM EDT ----- Regarding: FW: Ozempic  please let Hiliana know, message below, Ozempic  nor any other med will be covered for weight loss. Remind her we can send a referral to our healthy weight & wellness clinic if desired, thx. ----- Message ----- From: Deidra Bernice SAILOR, CPhT Sent: 04/16/2024   8:30 AM EDT To: Krabbe Lucius, NP Subject: RE: Ozempic                                     Yes, unfortunately don't cover weight loss meds. ----- Message ----- From: Lucius Krabbe, NP Sent: 04/15/2024   3:03 PM EDT To: Bernice SAILOR Deidra, CPhT Subject: RE: Ozempic                                     Ok, thanks. So no meds covered for weight loss right? ----- Message ----- From: Deidra Bernice SAILOR, CPhT Sent: 04/15/2024   1:33 PM EDT To: Krabbe Lucius, NP Subject: RE: Ozempic                                     Hey, in order for her insurance to cover this medication she must be diabetic, her A1C needs to be above 6.5%. ----- Message ----- From: Lucius Krabbe, NP Sent: 04/15/2024  10:49 AM EDT To: Rx Prior Auth Team Subject: Ozempic                                         Please submit PA - pt has Prediabetes, HTN, Hyperlipidemia, thanks!

## 2024-04-18 NOTE — Telephone Encounter (Signed)
 I reached out to pt and lvm in regards to denial and recommendations from PCP.

## 2024-04-21 ENCOUNTER — Other Ambulatory Visit (INDEPENDENT_AMBULATORY_CARE_PROVIDER_SITE_OTHER)

## 2024-04-21 ENCOUNTER — Other Ambulatory Visit

## 2024-04-21 DIAGNOSIS — I10 Essential (primary) hypertension: Secondary | ICD-10-CM

## 2024-04-21 LAB — BASIC METABOLIC PANEL WITH GFR
BUN: 7 mg/dL (ref 6–23)
CO2: 27 meq/L (ref 19–32)
Calcium: 8.4 mg/dL (ref 8.4–10.5)
Chloride: 103 meq/L (ref 96–112)
Creatinine, Ser: 0.8 mg/dL (ref 0.40–1.20)
GFR: 80.62 mL/min (ref >60.00)
Glucose, Bld: 115 mg/dL — ABNORMAL HIGH (ref 70–99)
Potassium: 3.4 meq/L — ABNORMAL LOW (ref 3.5–5.1)
Sodium: 141 meq/L (ref 135–145)

## 2024-04-22 ENCOUNTER — Ambulatory Visit: Payer: Self-pay | Admitting: Family

## 2024-04-23 ENCOUNTER — Encounter: Payer: Self-pay | Admitting: Family

## 2024-04-25 ENCOUNTER — Encounter: Payer: Self-pay | Admitting: "Endocrinology

## 2024-04-25 ENCOUNTER — Encounter: Payer: Self-pay | Admitting: Family

## 2024-04-25 DIAGNOSIS — Z79890 Hormone replacement therapy (postmenopausal): Secondary | ICD-10-CM

## 2024-04-27 NOTE — Telephone Encounter (Signed)
 I have sent referral, thx

## 2024-05-02 DIAGNOSIS — Z48813 Encounter for surgical aftercare following surgery on the respiratory system: Secondary | ICD-10-CM | POA: Diagnosis not present

## 2024-05-15 DIAGNOSIS — Z48813 Encounter for surgical aftercare following surgery on the respiratory system: Secondary | ICD-10-CM | POA: Diagnosis not present

## 2024-05-16 DIAGNOSIS — Z23 Encounter for immunization: Secondary | ICD-10-CM | POA: Diagnosis not present

## 2024-05-19 ENCOUNTER — Ambulatory Visit: Payer: Self-pay

## 2024-05-19 ENCOUNTER — Inpatient Hospital Stay (HOSPITAL_BASED_OUTPATIENT_CLINIC_OR_DEPARTMENT_OTHER)
Admission: EM | Admit: 2024-05-19 | Discharge: 2024-05-22 | DRG: 176 | Disposition: A | Discharge status: Home / Self Care | Attending: Internal Medicine | Admitting: Internal Medicine

## 2024-05-19 ENCOUNTER — Other Ambulatory Visit: Payer: Self-pay

## 2024-05-19 ENCOUNTER — Emergency Department (HOSPITAL_BASED_OUTPATIENT_CLINIC_OR_DEPARTMENT_OTHER)

## 2024-05-19 ENCOUNTER — Encounter (HOSPITAL_BASED_OUTPATIENT_CLINIC_OR_DEPARTMENT_OTHER): Payer: Self-pay

## 2024-05-19 DIAGNOSIS — I10 Essential (primary) hypertension: Secondary | ICD-10-CM | POA: Diagnosis present

## 2024-05-19 DIAGNOSIS — I82442 Acute embolism and thrombosis of left tibial vein: Secondary | ICD-10-CM | POA: Diagnosis not present

## 2024-05-19 DIAGNOSIS — Z79890 Hormone replacement therapy (postmenopausal): Secondary | ICD-10-CM

## 2024-05-19 DIAGNOSIS — F418 Other specified anxiety disorders: Secondary | ICD-10-CM | POA: Insufficient documentation

## 2024-05-19 DIAGNOSIS — Z90710 Acquired absence of both cervix and uterus: Secondary | ICD-10-CM

## 2024-05-19 DIAGNOSIS — F32A Depression, unspecified: Secondary | ICD-10-CM | POA: Diagnosis present

## 2024-05-19 DIAGNOSIS — Z885 Allergy status to narcotic agent status: Secondary | ICD-10-CM

## 2024-05-19 DIAGNOSIS — G4700 Insomnia, unspecified: Secondary | ICD-10-CM | POA: Diagnosis present

## 2024-05-19 DIAGNOSIS — Z79899 Other long term (current) drug therapy: Secondary | ICD-10-CM

## 2024-05-19 DIAGNOSIS — E669 Obesity, unspecified: Secondary | ICD-10-CM | POA: Diagnosis present

## 2024-05-19 DIAGNOSIS — R000 Tachycardia, unspecified: Secondary | ICD-10-CM | POA: Diagnosis not present

## 2024-05-19 DIAGNOSIS — Z8261 Family history of arthritis: Secondary | ICD-10-CM

## 2024-05-19 DIAGNOSIS — R7303 Prediabetes: Secondary | ICD-10-CM | POA: Diagnosis not present

## 2024-05-19 DIAGNOSIS — T7849XA Other allergy, initial encounter: Secondary | ICD-10-CM | POA: Diagnosis present

## 2024-05-19 DIAGNOSIS — I82432 Acute embolism and thrombosis of left popliteal vein: Secondary | ICD-10-CM | POA: Diagnosis present

## 2024-05-19 DIAGNOSIS — Z83719 Family history of colon polyps, unspecified: Secondary | ICD-10-CM

## 2024-05-19 DIAGNOSIS — M5416 Radiculopathy, lumbar region: Secondary | ICD-10-CM | POA: Diagnosis present

## 2024-05-19 DIAGNOSIS — Z8249 Family history of ischemic heart disease and other diseases of the circulatory system: Secondary | ICD-10-CM | POA: Diagnosis not present

## 2024-05-19 DIAGNOSIS — Z841 Family history of disorders of kidney and ureter: Secondary | ICD-10-CM

## 2024-05-19 DIAGNOSIS — E876 Hypokalemia: Secondary | ICD-10-CM | POA: Diagnosis present

## 2024-05-19 DIAGNOSIS — F419 Anxiety disorder, unspecified: Secondary | ICD-10-CM | POA: Diagnosis present

## 2024-05-19 DIAGNOSIS — I82412 Acute embolism and thrombosis of left femoral vein: Secondary | ICD-10-CM | POA: Diagnosis not present

## 2024-05-19 DIAGNOSIS — M1990 Unspecified osteoarthritis, unspecified site: Secondary | ICD-10-CM | POA: Diagnosis not present

## 2024-05-19 DIAGNOSIS — I82402 Acute embolism and thrombosis of unspecified deep veins of left lower extremity: Secondary | ICD-10-CM | POA: Diagnosis not present

## 2024-05-19 DIAGNOSIS — Z7901 Long term (current) use of anticoagulants: Secondary | ICD-10-CM | POA: Diagnosis not present

## 2024-05-19 DIAGNOSIS — I341 Nonrheumatic mitral (valve) prolapse: Secondary | ICD-10-CM | POA: Diagnosis present

## 2024-05-19 DIAGNOSIS — H548 Legal blindness, as defined in USA: Secondary | ICD-10-CM | POA: Diagnosis not present

## 2024-05-19 DIAGNOSIS — Z888 Allergy status to other drugs, medicaments and biological substances status: Secondary | ICD-10-CM

## 2024-05-19 DIAGNOSIS — Z9079 Acquired absence of other genital organ(s): Secondary | ICD-10-CM

## 2024-05-19 DIAGNOSIS — Z821 Family history of blindness and visual loss: Secondary | ICD-10-CM

## 2024-05-19 DIAGNOSIS — I2692 Saddle embolus of pulmonary artery without acute cor pulmonale: Principal | ICD-10-CM | POA: Diagnosis present

## 2024-05-19 DIAGNOSIS — Z6834 Body mass index (BMI) 34.0-34.9, adult: Secondary | ICD-10-CM

## 2024-05-19 DIAGNOSIS — X58XXXA Exposure to other specified factors, initial encounter: Secondary | ICD-10-CM | POA: Diagnosis present

## 2024-05-19 DIAGNOSIS — M7989 Other specified soft tissue disorders: Secondary | ICD-10-CM

## 2024-05-19 DIAGNOSIS — E782 Mixed hyperlipidemia: Secondary | ICD-10-CM | POA: Diagnosis not present

## 2024-05-19 DIAGNOSIS — J9811 Atelectasis: Secondary | ICD-10-CM | POA: Diagnosis not present

## 2024-05-19 DIAGNOSIS — Z811 Family history of alcohol abuse and dependence: Secondary | ICD-10-CM

## 2024-05-19 DIAGNOSIS — Z880 Allergy status to penicillin: Secondary | ICD-10-CM

## 2024-05-19 DIAGNOSIS — I824Y2 Acute embolism and thrombosis of unspecified deep veins of left proximal lower extremity: Secondary | ICD-10-CM | POA: Diagnosis present

## 2024-05-19 DIAGNOSIS — E890 Postprocedural hypothyroidism: Secondary | ICD-10-CM | POA: Diagnosis present

## 2024-05-19 DIAGNOSIS — I2699 Other pulmonary embolism without acute cor pulmonale: Secondary | ICD-10-CM | POA: Diagnosis present

## 2024-05-19 DIAGNOSIS — R0602 Shortness of breath: Secondary | ICD-10-CM | POA: Diagnosis not present

## 2024-05-19 DIAGNOSIS — R600 Localized edema: Secondary | ICD-10-CM | POA: Diagnosis not present

## 2024-05-19 DIAGNOSIS — K219 Gastro-esophageal reflux disease without esophagitis: Secondary | ICD-10-CM | POA: Diagnosis present

## 2024-05-19 LAB — CBC WITH DIFFERENTIAL/PLATELET
Abs Immature Granulocytes: 0.03 K/uL (ref 0.00–0.07)
Basophils Absolute: 0.1 K/uL (ref 0.0–0.1)
Basophils Relative: 1 %
Eosinophils Absolute: 0.4 K/uL (ref 0.0–0.5)
Eosinophils Relative: 4 %
HCT: 36.4 % (ref 36.0–46.0)
Hemoglobin: 12 g/dL (ref 12.0–15.0)
Immature Granulocytes: 0 %
Lymphocytes Relative: 23 %
Lymphs Abs: 2 K/uL (ref 0.7–4.0)
MCH: 28.6 pg (ref 26.0–34.0)
MCHC: 33 g/dL (ref 30.0–36.0)
MCV: 86.7 fL (ref 80.0–100.0)
Monocytes Absolute: 0.5 K/uL (ref 0.1–1.0)
Monocytes Relative: 5 %
Neutro Abs: 5.9 K/uL (ref 1.7–7.7)
Neutrophils Relative %: 67 %
Platelets: 201 K/uL (ref 150–400)
RBC: 4.2 MIL/uL (ref 3.87–5.11)
RDW: 13.3 % (ref 11.5–15.5)
WBC: 8.9 K/uL (ref 4.0–10.5)
nRBC: 0 % (ref 0.0–0.2)

## 2024-05-19 LAB — COMPREHENSIVE METABOLIC PANEL WITH GFR
ALT: 7 U/L (ref 0–44)
AST: 17 U/L (ref 15–41)
Albumin: 3.8 g/dL (ref 3.5–5.0)
Alkaline Phosphatase: 101 U/L (ref 38–126)
Anion gap: 16 — ABNORMAL HIGH (ref 5–15)
BUN: 11 mg/dL (ref 6–20)
CO2: 18 mmol/L — ABNORMAL LOW (ref 22–32)
Calcium: 9.3 mg/dL (ref 8.9–10.3)
Chloride: 98 mmol/L (ref 98–111)
Creatinine, Ser: 0.89 mg/dL (ref 0.44–1.00)
GFR, Estimated: >60 mL/min (ref >60)
Glucose, Bld: 127 mg/dL — ABNORMAL HIGH (ref 70–99)
Potassium: 3.4 mmol/L — ABNORMAL LOW (ref 3.5–5.1)
Sodium: 133 mmol/L — ABNORMAL LOW (ref 135–145)
Total Bilirubin: 0.5 mg/dL (ref 0.0–1.2)
Total Protein: 7.4 g/dL (ref 6.5–8.1)

## 2024-05-19 LAB — PROTIME-INR
INR: 1 (ref 0.8–1.2)
Prothrombin Time: 13.6 s (ref 11.4–15.2)

## 2024-05-19 LAB — TROPONIN T, HIGH SENSITIVITY: Troponin T High Sensitivity: <15 ng/L (ref 0–19)

## 2024-05-19 MED ORDER — HEPARIN BOLUS VIA INFUSION
6150.0000 [IU] | Freq: Once | INTRAVENOUS | Status: AC
Start: 2024-05-19 — End: 2024-05-19
  Administered 2024-05-19: 6150 [IU] via INTRAVENOUS

## 2024-05-19 MED ORDER — HEPARIN (PORCINE) 25000 UT/250ML-% IV SOLN
1250.0000 [IU]/h | INTRAVENOUS | Status: DC
  Administered 2024-05-19: 1400 [IU]/h via INTRAVENOUS
  Administered 2024-05-20 – 2024-05-22 (×3): 1250 [IU]/h via INTRAVENOUS
  Filled 2024-05-19 (×4): qty 250

## 2024-05-19 MED ORDER — IOHEXOL 350 MG/ML SOLN
100.0000 mL | Freq: Once | INTRAVENOUS | Status: AC | PRN
  Administered 2024-05-19: 75 mL via INTRAVENOUS

## 2024-05-19 NOTE — ED Provider Notes (Signed)
 [place] EMERGENCY DEPARTMENT AT [facility] Provider Note   CSN: [id number] Arrival date & time: 05/19/24  1435     Patient presents with: Leg Pain (L)   Alyssa Fisher is a 58 y.o. female who presents to the emergency department with a chief complaint of left leg pain and swelling as well as increased work of breathing whenever exerting herself.  Patient states that she woke up on Saturday morning and noticed that her left leg was painful in the calf area and that she also had swelling of her left leg when compared to the right.  She states that since then she has had pain when lifting her left leg and believes that it might be possibly due to her left leg being heavy due to the swelling.  She does not appreciate any redness to her calf area.  Denies previous history of blood clots.  Denies fever or chills, chest pain.  She does appreciate shortness of breath when overexerting herself specifically when going up stairs.  She states that last Friday she got the flu and COVID shots, states that she had a septoplasty procedure performed approximately 6 weeks ago, and is on estrogen hormone replacement therapy.  Denies history of known hypercoagulable state, denies blood thinner.  She states that she did have some left calf pain following her septoplasty procedure but that this resolved after some time.  Denies recent long distance travel.  Past medical history significant for mitral valve prolapse, thyroid  nodules, hypothyroidism, chronic left-sided lumbar radiculopathy, insomnia, anxiety, GERD, hypertension, etc.  {Add pertinent medical, surgical, social history, OB history to HPI:32947}  Leg Pain      Prior to Admission medications   Medication Sig Start Date End Date Taking? Authorizing Provider  ALPRAZolam  (XANAX ) 0.25 MG tablet Take 1 tablet (0.25 mg total) by mouth daily as needed for anxiety. 04/18/23   Lucius Krabbe, NP  Azelastine  HCl 137 MCG/SPRAY SOLN PLACE 2 SPRAYS INTO  BOTH NOSTRILS 2 (TWO) TIMES DAILY. USE IN EACH NOSTRIL AS DIRECTED 01/16/24   Tobie Comp B, MD  cetirizine (ZYRTEC) 10 MG tablet Take 10 mg by mouth daily.    [provider]  diltiazem  (DILT-XR) 120 MG 24 hr capsule Take 1 capsule (120 mg total) by mouth daily. 04/15/24   Lucius Krabbe, NP  escitalopram  (LEXAPRO ) 10 MG tablet Take 1 tablet (10 mg total) by mouth daily. 04/15/24   Lucius Krabbe, NP  fluticasone  (FLONASE ) 50 MCG/ACT nasal spray SPRAY 2 SPRAYS INTO EACH NOSTRIL EVERY DAY 01/14/24   Patel, Kunjan B, MD  levothyroxine  (SYNTHROID ) 125 MCG tablet Take 1 tablet (125 mcg total) by mouth daily. 03/13/24   Motwani, Komal, MD  Multiple Vitamin (MULTIVITAMIN ADULT PO) Take by mouth.    [provider]  omeprazole  (PRILOSEC) 20 MG capsule Take 1 capsule (20 mg total) by mouth daily. 04/15/24   Lucius Krabbe, NP    Allergies: Augmentin  [amoxicillin -pot clavulanate], Bupropion, and Codeine    Review of Systems  Cardiovascular:  Positive for leg swelling (Left > Right).    Updated Vital Signs BP 127/84 (BP Location: Left Arm)   Pulse (!) 110   Temp 98.1 F (36.7 C)   Resp 16   LMP 10/12/2012   SpO2 97%   Physical Exam Vitals and nursing note reviewed.  Constitutional:      General: She is awake. She is not in acute distress.    Appearance: Normal appearance. She is not ill-appearing, toxic-appearing or diaphoretic.  HENT:  Head: Normocephalic and atraumatic.  Eyes:     General: No scleral icterus. Cardiovascular:     Rate and Rhythm: Regular rhythm. Tachycardia present.  Pulmonary:     Effort: Pulmonary effort is normal. No respiratory distress.     Breath sounds: No wheezing, rhonchi or rales.  Musculoskeletal:        General: Normal range of motion.     Cervical back: Normal range of motion.     Left lower leg: Edema present.     Comments: Swelling present to left lower extremity, normal plantar and dorsiflexion of left ankle, patient  able to move all digits of left foot, intact flexion extension of left knee, patient ambulatory without assistance  Skin:    General: Skin is warm.     Capillary Refill: Capillary refill takes less than 2 seconds.     Comments: Left lower extremity neurovascularly intact, DP pulses 2+ confirmed with Doppler, significant swelling of left lower extremity when compared to right, also slight overlying erythema  Neurological:     General: No focal deficit present.     Mental Status: She is alert and oriented to person, place, and time.  Psychiatric:        Mood and Affect: Mood normal.        Behavior: Behavior normal. Behavior is cooperative.     (all labs ordered are listed, but only abnormal results are displayed) Labs Reviewed  COMPREHENSIVE METABOLIC PANEL WITH GFR - Abnormal; Notable for the following components:      Result Value   Sodium 133 (*)    Potassium 3.4 (*)    CO2 18 (*)    Glucose, Bld 127 (*)    Anion gap 16 (*)    All other components within normal limits  CBC WITH DIFFERENTIAL/PLATELET  PROTIME-INR    EKG: None  Radiology: No results found.  {Document cardiac monitor, telemetry assessment procedure when appropriate:32947} Procedures   Medications Ordered in the ED - No data to display    {Click here for ABCD2, HEART and other calculators REFRESH Note before signing:1}                              Medical Decision Making Amount and/or Complexity of Data Reviewed Labs: ordered. Radiology: ordered.  Risk Prescription drug management. Decision regarding hospitalization.   Patient presents to the ED for concern of shortness of breath, left leg swelling, this involves an extensive number of treatment options, and is a complaint that carries with it a high risk of complications and morbidity.  The differential diagnosis includes DVT, cellulitis, lymphedema, CHF exacerbation, pulmonary embolism, pneumonia, pneumothorax, etc.   Co morbidities that  complicate the patient evaluation  mitral valve prolapse, thyroid  nodules, hypothyroidism, chronic left-sided lumbar radiculopathy, insomnia, anxiety, GERD, hypertension   Lab Tests:  I Ordered, and personally interpreted labs.  The pertinent results include: CBC unremarkable, CMP remarkable only for slight electrolyte abnormalities, PT/INR unremarkable, troponin less than 15   Imaging Studies ordered:  I ordered imaging studies including CT angio chest, ultrasound of left lower extremity I independently visualized and interpreted imaging which showed: DVT of the left lower extremity as well as bilateral pulmonary embolism I agree with the radiologist interpretation   Cardiac Monitoring:  The patient was maintained on a cardiac monitor.  I personally viewed and interpreted the cardiac monitored which showed an underlying rhythm of: Sinus tachycardia   Medicines ordered and prescription drug  management:  I ordered medication including heparin  for DVT/PE Reevaluation of the patient after these medicines showed that the patient stayed the same I have reviewed the patients home medicines and have made adjustments as needed   Test Considered:  None   Critical Interventions:  Patient started on heparin  due to positive DVT ultrasound as well as CTA positive for bilateral pulmonary embolism   Consultations Obtained:  I requested consultation with the hospitalist,  and discussed lab and imaging findings as well as pertinent plan - they recommend: ***   Problem List / ED Course:  58 year old female, left leg pain and swelling as well as increased work of breathing with exertion, recent surgery, estrogen hormone replacement, no history of DVT or PE Vital signs stable but tachycardia at 110, no obvious abnormality auscultation of heart or lungs, left lower extremity swollen when compared to right, overlying skin changes, tenderness with palpation of calf Based off of history and  risk factors suspicious for possible DVT versus pulmonary embolism, due to this we will obtain basic labs including PT/INR, EKG as well as imaging of chest as well as left lower extremity to rule out possible blood clot Imaging significant for DVT as well as bilateral pulmonary emboli, no heart strain, patient vital signs stable however tachycardic Patient started on cardiac monitoring, heparin  ordered Consulted hospitalist for admission Dr. Seena   Reevaluation:  After the interventions noted above, I reevaluated the patient and found that they have :{resolved/improved/worsened:23923::improved}   Social Determinants of Health:  None   Dispostion:  After consideration of the diagnostic results and the patients response to treatment, I feel that the patent would benefit from admission to the hospital for ongoing diagnosis and treatment  {Document critical care time when appropriate  Document review of labs and clinical decision tools ie [id number], etc  Document your independent review of radiology images and any outside records  Document your discussion with family members, caretakers and with consultants  Document social determinants of health affecting pt's care  Document your decision making why or why not admission, treatments were needed:32947:::1}   Final diagnoses:  None    ED Discharge Orders     None

## 2024-05-19 NOTE — ED Notes (Signed)
[facility] called for transport 

## 2024-05-19 NOTE — Telephone Encounter (Signed)
 Reviewed

## 2024-05-19 NOTE — ED Triage Notes (Signed)
 Pt c/o L leg swelling onset Saturday AM. Pain from having to lift it, pain in L calf area, not aware of any redness. Increased work of breathing when going up stairs States Friday afternoon received flu/ covid shots, unsure if related

## 2024-05-19 NOTE — ED Notes (Signed)
 Report given to [facility] RN @[facility] 

## 2024-05-19 NOTE — ED Notes (Signed)
 Frozen meal given to pt and shasta coke

## 2024-05-19 NOTE — Telephone Encounter (Signed)
 FYI Only or Action Required?: FYI only for provider.  Patient was last seen in primary care on 04/15/2024 by Lucius Krabbe, NP.  Called Nurse Triage reporting Leg Swelling.  Symptoms began today.  Interventions attempted: Other: elevating the leg.  Symptoms are: unchanged.   Patient/caregiver understands and will follow disposition?: yes        Copied from CRM [id number]. Topic: Clinical - Red Word Triage >> May 19, 2024  2:06 PM Ivette P wrote: [facility] that prompted transfer to Nurse Triage: left leg is swollen and doubled in size. happened when woke up saturday   pain from muscles having to lif leg. Reason for Disposition  [1] Thigh or calf pain AND [2] only 1 side AND [3] present > 1 hour  Answer Assessment - Initial Assessment Questions 1. ONSET: When did the swelling start? (e.g., minutes, hours, days)     Saturday  2. LOCATION: What part of the leg is swollen?  Are both legs swollen or just one leg?     Entire leg left leg  3. SEVERITY: How bad is the swelling? (e.g., localized; mild, moderate, severe)     Tight  4. REDNESS: Is there redness or signs of infection?     no 5. PAIN: Is the swelling painful to touch? If Yes, ask: How painful is it?   (Scale 1-10; mild, moderate or severe)     Muscles hurt thigh and calf  6. FEVER: Do you have a fever? If Yes, ask: What is it, how was it measured, and when did it start?      no 7. CAUSE: What do you think is causing the leg swelling?     *No Answer* 8. MEDICAL HISTORY: Do you have a history of blood clots (e.g., DVT), cancer, heart failure, kidney disease, or liver failure?     no 9. RECURRENT SYMPTOM: Have you had leg swelling before? If Yes, ask: When was the last time? What happened that time?     no 10. OTHER SYMPTOMS: Do you have any other symptoms? (e.g., chest pain, difficulty breathing)       *No Answer* 11. PREGNANCY: Is there any chance you are pregnant? When was your  last menstrual period?       N/a  Protocols used: Leg Swelling and Edema-A-AH

## 2024-05-19 NOTE — ED Notes (Signed)
 Pt to be admitted, VSS, NAD and no shortness of breath.

## 2024-05-19 NOTE — Progress Notes (Signed)
 PHARMACY - ANTICOAGULATION CONSULT NOTE  Pharmacy Consult for Heparin  Indication: pulmonary embolus  Allergies  Allergen Reactions   Augmentin  [Amoxicillin -Pot Clavulanate] Diarrhea   Bupropion Other (See Comments)    Speech sx- stuttering   Codeine     Patient stated possible due to mother and grandparents being allergic as well. Per medical history form dated 01/18/11. Prefers not to have.    Patient Measurements: Height: 5' 5 (165.1 cm) Weight: 90.7 kg (200 lb) IBW/kg (Calculated) : 57 HEPARIN  DW (KG): 77.1  Vital Signs: Temp: 98.1 F (36.7 C) (09/15 1450) BP: 108/85 (09/15 1630) Pulse Rate: 101 (09/15 1630)  Labs: Recent Labs    05/19/24 1451 05/19/24 1557  HGB 12.0  --   HCT 36.4  --   PLT 201  --   LABPROT  --  13.6  INR  --  1.0  CREATININE 0.89  --     Estimated Creatinine Clearance: 75.7 mL/min (by C-G formula based on SCr of 0.89 mg/dL).   Medical History: Past Medical History:  Diagnosis Date   Anxiety    Broken ankle    Chronic left-sided lumbar radiculopathy 01/05/2022   Contraceptive management 11/21/2012   Depression    Dizziness - light-headed/fainting 02/20/2011   Dysphagia 10/23/2012   due to thyroid  issues   Dysphagia 03/01/2011   Elevated LDL cholesterol level 05/22/2022   Fatigue/Loss of Sleep 02/20/2011   FHx: allergies 02/20/2011   Ask patient to clarify. Only had allergies noted. Need more detail per medical history form dated 01/18/11.   Generalized headaches 02/20/2011   GERD (gastroesophageal reflux disease)    History of tonsillectomy    Hoarseness/sore throat 02/20/2011   Hypertension    Miscarriage    One - date not on medical form.   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 CT angio on 9/15 reveals PE. 9/15 venous US  reveals DVT. Per RN, pt is not on anticoag medications at home. No weight was recorded in EMR at time of dosing. Per RN, pt states she is 90.7 kg.  Hgb and PLT WNL  Goal of Therapy:  Heparin  level 0.3-0.7 units/ml Monitor platelets by anticoagulation protocol: Yes   Plan:  Give 6150 units bolus x 1 Start heparin  infusion at 1400 units/hr Check Heparin  level in 6 hours and daily while on heparin  Continue to monitor H&H and platelets  Prentice DOROTHA Favors, PharmD PGY1 Health-System Pharmacy Administration and Leadership Resident [facility]  05/19/2024 6:01 PM

## 2024-05-20 ENCOUNTER — Telehealth (HOSPITAL_COMMUNITY): Payer: Self-pay | Admitting: Pharmacy Technician

## 2024-05-20 ENCOUNTER — Other Ambulatory Visit (HOSPITAL_COMMUNITY)

## 2024-05-20 ENCOUNTER — Other Ambulatory Visit (HOSPITAL_COMMUNITY): Payer: Self-pay

## 2024-05-20 ENCOUNTER — Observation Stay (HOSPITAL_COMMUNITY)

## 2024-05-20 DIAGNOSIS — I2699 Other pulmonary embolism without acute cor pulmonale: Secondary | ICD-10-CM | POA: Diagnosis not present

## 2024-05-20 DIAGNOSIS — E890 Postprocedural hypothyroidism: Secondary | ICD-10-CM | POA: Diagnosis present

## 2024-05-20 DIAGNOSIS — E876 Hypokalemia: Secondary | ICD-10-CM | POA: Insufficient documentation

## 2024-05-20 DIAGNOSIS — I2602 Saddle embolus of pulmonary artery with acute cor pulmonale: Secondary | ICD-10-CM

## 2024-05-20 DIAGNOSIS — I82432 Acute embolism and thrombosis of left popliteal vein: Secondary | ICD-10-CM | POA: Diagnosis present

## 2024-05-20 DIAGNOSIS — X58XXXA Exposure to other specified factors, initial encounter: Secondary | ICD-10-CM | POA: Diagnosis present

## 2024-05-20 DIAGNOSIS — Z79890 Hormone replacement therapy (postmenopausal): Secondary | ICD-10-CM | POA: Diagnosis not present

## 2024-05-20 DIAGNOSIS — E782 Mixed hyperlipidemia: Secondary | ICD-10-CM | POA: Diagnosis present

## 2024-05-20 DIAGNOSIS — I82402 Acute embolism and thrombosis of unspecified deep veins of left lower extremity: Secondary | ICD-10-CM | POA: Diagnosis not present

## 2024-05-20 DIAGNOSIS — Z79899 Other long term (current) drug therapy: Secondary | ICD-10-CM | POA: Diagnosis not present

## 2024-05-20 DIAGNOSIS — F418 Other specified anxiety disorders: Secondary | ICD-10-CM | POA: Insufficient documentation

## 2024-05-20 DIAGNOSIS — I10 Essential (primary) hypertension: Secondary | ICD-10-CM | POA: Diagnosis present

## 2024-05-20 DIAGNOSIS — M5416 Radiculopathy, lumbar region: Secondary | ICD-10-CM | POA: Diagnosis present

## 2024-05-20 DIAGNOSIS — I82412 Acute embolism and thrombosis of left femoral vein: Secondary | ICD-10-CM | POA: Diagnosis present

## 2024-05-20 DIAGNOSIS — R7303 Prediabetes: Secondary | ICD-10-CM | POA: Diagnosis present

## 2024-05-20 DIAGNOSIS — H548 Legal blindness, as defined in USA: Secondary | ICD-10-CM | POA: Diagnosis present

## 2024-05-20 DIAGNOSIS — R000 Tachycardia, unspecified: Secondary | ICD-10-CM | POA: Diagnosis present

## 2024-05-20 DIAGNOSIS — I82442 Acute embolism and thrombosis of left tibial vein: Secondary | ICD-10-CM | POA: Diagnosis present

## 2024-05-20 DIAGNOSIS — F32A Depression, unspecified: Secondary | ICD-10-CM | POA: Diagnosis present

## 2024-05-20 DIAGNOSIS — I2692 Saddle embolus of pulmonary artery without acute cor pulmonale: Secondary | ICD-10-CM | POA: Diagnosis not present

## 2024-05-20 DIAGNOSIS — Z6834 Body mass index (BMI) 34.0-34.9, adult: Secondary | ICD-10-CM | POA: Diagnosis not present

## 2024-05-20 DIAGNOSIS — K219 Gastro-esophageal reflux disease without esophagitis: Secondary | ICD-10-CM | POA: Diagnosis present

## 2024-05-20 DIAGNOSIS — G4700 Insomnia, unspecified: Secondary | ICD-10-CM | POA: Diagnosis present

## 2024-05-20 DIAGNOSIS — E669 Obesity, unspecified: Secondary | ICD-10-CM | POA: Diagnosis present

## 2024-05-20 DIAGNOSIS — M1990 Unspecified osteoarthritis, unspecified site: Secondary | ICD-10-CM | POA: Diagnosis present

## 2024-05-20 DIAGNOSIS — Z7901 Long term (current) use of anticoagulants: Secondary | ICD-10-CM | POA: Diagnosis not present

## 2024-05-20 DIAGNOSIS — I341 Nonrheumatic mitral (valve) prolapse: Secondary | ICD-10-CM | POA: Diagnosis present

## 2024-05-20 DIAGNOSIS — I824Y2 Acute embolism and thrombosis of unspecified deep veins of left proximal lower extremity: Secondary | ICD-10-CM | POA: Diagnosis present

## 2024-05-20 DIAGNOSIS — Z8249 Family history of ischemic heart disease and other diseases of the circulatory system: Secondary | ICD-10-CM | POA: Diagnosis not present

## 2024-05-20 LAB — HEPARIN LEVEL (UNFRACTIONATED)
Heparin Unfractionated: 0.84 [IU]/mL — ABNORMAL HIGH (ref 0.30–0.70)
Heparin Unfractionated: 0.41 [IU]/mL (ref 0.30–0.70)

## 2024-05-20 LAB — CBC
HCT: 36.4 % (ref 36.0–46.0)
Hemoglobin: 12.1 g/dL (ref 12.0–15.0)
MCH: 28.7 pg (ref 26.0–34.0)
MCHC: 33.2 g/dL (ref 30.0–36.0)
MCV: 86.5 fL (ref 80.0–100.0)
Platelets: 209 K/uL (ref 150–400)
RBC: 4.21 MIL/uL (ref 3.87–5.11)
RDW: 13.4 % (ref 11.5–15.5)
WBC: 8.3 K/uL (ref 4.0–10.5)
nRBC: 0 % (ref 0.0–0.2)

## 2024-05-20 LAB — ECHOCARDIOGRAM COMPLETE
Area-P 1/2: 3.6 cm2
Calc EF: 64.3 %
Height: 65 in
S' Lateral: 3.5 cm
Single Plane A2C EF: 68 %
Single Plane A4C EF: 62.3 %
Weight: 3316.8 [oz_av]

## 2024-05-20 LAB — BASIC METABOLIC PANEL WITH GFR
Anion gap: 12 (ref 5–15)
BUN: 9 mg/dL (ref 6–20)
CO2: 22 mmol/L (ref 22–32)
Calcium: 8.8 mg/dL — ABNORMAL LOW (ref 8.9–10.3)
Chloride: 102 mmol/L (ref 98–111)
Creatinine, Ser: 0.85 mg/dL (ref 0.44–1.00)
GFR, Estimated: >60 mL/min (ref >60)
Glucose, Bld: 108 mg/dL — ABNORMAL HIGH (ref 70–99)
Potassium: 3.5 mmol/L (ref 3.5–5.1)
Sodium: 136 mmol/L (ref 135–145)

## 2024-05-20 LAB — GLUCOSE, CAPILLARY
Glucose-Capillary: 115 mg/dL — ABNORMAL HIGH (ref 70–99)
Glucose-Capillary: 114 mg/dL — ABNORMAL HIGH (ref 70–99)
Glucose-Capillary: 123 mg/dL — ABNORMAL HIGH (ref 70–99)
Glucose-Capillary: 113 mg/dL — ABNORMAL HIGH (ref 70–99)

## 2024-05-20 LAB — BRAIN NATRIURETIC PEPTIDE: B Natriuretic Peptide: 12.7 pg/mL (ref 0.0–100.0)

## 2024-05-20 LAB — TROPONIN I (HIGH SENSITIVITY): Troponin I (High Sensitivity): 14 ng/L (ref <18)

## 2024-05-20 MED ORDER — POLYETHYLENE GLYCOL 3350 17 G PO PACK: 17.0000 g | PACK | Freq: Every day | ORAL | Status: DC | PRN

## 2024-05-20 MED ORDER — ONDANSETRON HCL 4 MG PO TABS: 4.0000 mg | ORAL_TABLET | Freq: Four times a day (QID) | ORAL | Status: DC | PRN

## 2024-05-20 MED ORDER — POTASSIUM CHLORIDE CRYS ER 20 MEQ PO TBCR
20.0000 meq | EXTENDED_RELEASE_TABLET | Freq: Once | ORAL | Status: AC
Start: 2024-05-20 — End: 2024-05-20
  Administered 2024-05-20: 20 meq via ORAL
  Filled 2024-05-20: qty 1

## 2024-05-20 MED ORDER — MELATONIN 5 MG PO TABS: 5.0000 mg | ORAL_TABLET | Freq: Every evening | ORAL | Status: DC | PRN

## 2024-05-20 MED ORDER — FLUTICASONE PROPIONATE 50 MCG/ACT NA SUSP
1.0000 | Freq: Every day | NASAL | Status: DC
  Administered 2024-05-20 – 2024-05-22 (×3): 1 via NASAL
  Filled 2024-05-20: qty 16

## 2024-05-20 MED ORDER — ESCITALOPRAM OXALATE 10 MG PO TABS
10.0000 mg | ORAL_TABLET | Freq: Every day | ORAL | Status: DC
  Administered 2024-05-20 – 2024-05-22 (×3): 10 mg via ORAL
  Filled 2024-05-20 (×4): qty 1

## 2024-05-20 MED ORDER — PANTOPRAZOLE SODIUM 40 MG PO TBEC
40.0000 mg | DELAYED_RELEASE_TABLET | Freq: Every day | ORAL | Status: DC
  Administered 2024-05-20 – 2024-05-22 (×3): 40 mg via ORAL
  Filled 2024-05-20 (×4): qty 1

## 2024-05-20 MED ORDER — INSULIN ASPART 100 UNIT/ML IJ SOLN
0.0000 [IU] | Freq: Three times a day (TID) | INTRAMUSCULAR | Status: DC
  Administered 2024-05-20: 1 [IU] via SUBCUTANEOUS

## 2024-05-20 MED ORDER — DILTIAZEM HCL ER COATED BEADS 120 MG PO CP24
120.0000 mg | ORAL_CAPSULE | Freq: Every day | ORAL | Status: DC
  Administered 2024-05-20 – 2024-05-22 (×3): 120 mg via ORAL
  Filled 2024-05-20 (×4): qty 1

## 2024-05-20 MED ORDER — ACETAMINOPHEN 650 MG RE SUPP: 650.0000 mg | Freq: Four times a day (QID) | RECTAL | Status: DC | PRN

## 2024-05-20 MED ORDER — POTASSIUM CHLORIDE CRYS ER 20 MEQ PO TBCR
20.0000 meq | EXTENDED_RELEASE_TABLET | Freq: Once | ORAL | Status: DC
  Filled 2024-05-20: qty 1

## 2024-05-20 MED ORDER — ACETAMINOPHEN 325 MG PO TABS: 650.0000 mg | ORAL_TABLET | Freq: Four times a day (QID) | ORAL | Status: DC | PRN

## 2024-05-20 MED ORDER — ORAL CARE MOUTH RINSE: 15.0000 mL | OROMUCOSAL | Status: DC | PRN

## 2024-05-20 MED ORDER — ONDANSETRON HCL 4 MG/2ML IJ SOLN: 4.0000 mg | Freq: Four times a day (QID) | INTRAMUSCULAR | Status: DC | PRN

## 2024-05-20 MED ORDER — LEVOTHYROXINE SODIUM 112 MCG PO TABS
112.0000 ug | ORAL_TABLET | Freq: Every day | ORAL | Status: DC
  Administered 2024-05-20 – 2024-05-22 (×3): 112 ug via ORAL
  Filled 2024-05-20 (×3): qty 1

## 2024-05-20 MED ORDER — INSULIN ASPART 100 UNIT/ML IJ SOLN: 0.0000 [IU] | Freq: Every day | INTRAMUSCULAR | Status: DC

## 2024-05-20 MED ORDER — LEVOTHYROXINE SODIUM 75 MCG PO TABS
125.0000 ug | ORAL_TABLET | Freq: Every day | ORAL | Status: DC
  Filled 2024-05-20: qty 1

## 2024-05-20 NOTE — Consult Note (Signed)
 NAME:  Alyssa Fisher, MRN:  [id number], DOB:  01/11/1966, LOS: 0 ADMISSION DATE:  05/19/2024, CONSULTATION DATE:  05/20/2024 REFERRING MD:  Arlice Reichert, MD, CHIEF COMPLAINT:  pulmonary embolism  History of Present Illness:  Alyssa Fisher is a 58 year old woman who presents with acute pulmonary embolism and acute deep vein thrombosis.  About 3 weeks ago she had sinus surgery.  At that 2 weeks ago she started hormone replacement therapy with an estrogen patch.  She notes worsening shortness of breath over the past 2 to 3 weeks.  About 4 days ago she started noticing being worsening lower extremity edema in the left side.  She called her primary care doctor who advised her to come to the emergency room on Monday for evaluation for deep vein thrombosis.  She is positive for DVT in the left lower extremity.  She was also found to have acute pulmonary embolism bilateral segmental pulmonary arteries.  She has some shortness of breath but no chest pain, no wheezing, no chest tightness.  She is not on oxygen.  She has no previous history of bleeding or clotting disorder, cancer, or DVT PE.  Her CT chest was negative for any right heart strain.  Troponin was not elevated.  PCCM was consulted for evaluation for pulmonary embolism and evaluation for any additional therapies.  Significant Hospital Events: Including procedures, antibiotic start and stop dates in addition to other pertinent events     Interim History / Subjective:    Objective    Blood pressure 113/72, pulse 72, temperature 98.4 F (36.9 C), temperature source Oral, resp. rate 18, height 5' 5 (1.651 m), weight 94 kg, last menstrual period 10/12/2012, SpO2 95%.       No intake or output data in the 24 hours ending 05/20/24 0931 Filed Weights   05/19/24 1752 05/20/24 0054  Weight: 90.7 kg 94 kg    Examination: General: well appearing, no distress HENT: mmm on room air Lungs: breathing non labored, ctab no wheeze Cardiovascular:  tachycardic mid 90s, regular Abdomen: soft, nontender Extremities: left lower extremity edema and tenderness Neuro: normal speech no focal asymmetry  CT Chest reviewed - bilateral segmental PE in upper and lower branches with saddle distribution. No Right heart strain. Troponin is not elevated.   Resolved problem list   Assessment and Plan   Acute pulmonary embolism, likely provoked in the setting of estrogen therapy Additional risk factors include relatively sedentary lifestyle. She does have bilateral clot burden but this does not appear to be causing any kind of hemodynamic compromise.  She does not meet submassive criteria.  PESI score low.  I recommend continue with heparin  alone.  I recommend 6 months of anticoagulation.  I also advised her to have a conversation with her OB about whether or not to continue hormone replacement therapy.  I would likely err on the side of caution and recommend discontinuation.  If she is to continue a lower dose of estrogen therapy, I would likely recommend prophylactic anticoagulation with half dose Eliquis  or similar.  I will await the results of her echocardiogram and advise any further recommendations.  Verdon Gore, MD Pulmonary and Critical Care Medicine [facility] 05/20/2024 10:56 AM Pager: see AMION  If no response to pager, please call critical care on call (see AMION) until 7pm After 7:00 pm call Elink      Labs   CBC: Recent Labs  Lab 05/19/24 1451  WBC 8.9  NEUTROABS 5.9  HGB 12.0  HCT  36.4  MCV 86.7  PLT 201    Basic Metabolic Panel: Recent Labs  Lab 05/19/24 1451  NA 133*  K 3.4*  CL 98  CO2 18*  GLUCOSE 127*  BUN 11  CREATININE 0.89  CALCIUM  9.3   GFR: Estimated Creatinine Clearance: 77.1 mL/min (by C-G formula based on SCr of 0.89 mg/dL). Recent Labs  Lab 05/19/24 1451  WBC 8.9    Liver Function Tests: Recent Labs  Lab 05/19/24 1451  AST 17  ALT 7  ALKPHOS 101  BILITOT 0.5  PROT 7.4   ALBUMIN 3.8   No results for input(s): LIPASE, AMYLASE in the last 168 hours. No results for input(s): AMMONIA in the last 168 hours.  ABG No results found for: PHART, PCO2ART, PO2ART, HCO3, TCO2, ACIDBASEDEF, O2SAT   Coagulation Profile: Recent Labs  Lab 05/19/24 1557  INR 1.0    Cardiac Enzymes: No results for input(s): CKTOTAL, CKMB, CKMBINDEX, TROPONINI in the last 168 hours.  HbA1C: Hemoglobin A1C  Date/Time Value Ref Range Status  04/15/2024 10:54 AM 5.8 (A) 4.0 - 5.6 % Final    CBG: Recent Labs  Lab 05/20/24 0822  GLUCAP 115*    Review of Systems:   + shortness of breath, leg swelling - chest pain - fever - wheeze - confusion   Past Medical History:  She,  has a past medical history of Anxiety, Broken ankle, Chronic left-sided lumbar radiculopathy (01/05/2022), Contraceptive management (11/21/2012), Depression, Dizziness - light-headed/fainting (02/20/2011), Dysphagia (10/23/2012), Dysphagia (03/01/2011), Elevated LDL cholesterol level (05/22/2022), Fatigue/Loss of Sleep (02/20/2011), FHx: allergies (02/20/2011), Generalized headaches (02/20/2011), GERD (gastroesophageal reflux disease), History of tonsillectomy, Hoarseness/sore throat (02/20/2011), Hypertension, Miscarriage, Mitral valve prolapse (10/23/2012), Mixed hyperlipidemia (04/15/2024), Night sweats (02/20/2011), Obesity (BMI 30-39.9) (05/22/2022), Onychomycosis (12/16/2013), Rash/bruises easily (02/20/2011), Retinitis pigmentosa of both eyes (10/23/2012), Rosacea, Runny nose (02/20/2011), Seasonal allergies, Sinus problem (02/20/2011), Thyroid  goiter (10/23/2012), and Wears glasses/contacts (02/20/2011).   Surgical History:   Past Surgical History:  Procedure Laterality Date   ABDOMINAL HYSTERECTOMY     ANTERIOR AND POSTERIOR REPAIR N/A 02/21/2017   Procedure: ANTERIOR (CYSTOCELE) AND POSTERIOR REPAIR (RECTOCELE);  Surgeon: Horacio Boas, MD;  Location: [facility];  Service:  Gynecology;  Laterality: N/A;   BILATERAL SALPINGECTOMY N/A 02/21/2017   Procedure: BILATERAL SALPINGECTOMY;  Surgeon: Horacio Boas, MD;  Location: [facility];  Service: Gynecology;  Laterality: N/A;   BLADDER SUSPENSION N/A 02/21/2017   Procedure: Solyx Sling;  Surgeon: Horacio Boas, MD;  Location: [facility];  Service: Gynecology;  Laterality: N/A;   broken ankle Right    Pins/rod inserted-retained   COLONOSCOPY     DILATION AND CURETTAGE OF UTERUS     MAB   FRACTURE SURGERY     ORIF ANKLE FRACTURE Right 12/19/2018   Procedure: Removal of hardware and open reduction internal fixation of right ankle bimalleolar fracture;  Surgeon: Kit Rush, MD;  Location: [facility];  Service: Orthopedics;  Laterality: Right;  ok to open room per Tammy at [facility]   SEPTOPLASTY     THYROIDECTOMY N/A 10/29/2012   Procedure: THYROIDECTOMY;  Surgeon: Boas CHRISTELLA Spinner, MD;  Location: [facility];  Service: General;  Laterality: N/A;  Total Thyroidectomy   TONSILLECTOMY     VAGINAL HYSTERECTOMY Bilateral 02/21/2017   Procedure: HYSTERECTOMY VAGINAL;  Surgeon: Horacio Boas, MD;  Location: [facility];  Service: Gynecology;  Laterality: Bilateral;   WISDOM TOOTH EXTRACTION       Social History:   reports that she has never smoked. She  has never used smokeless tobacco. She reports that she does not currently use alcohol . She reports that she does not use drugs.   Family History:  Her family history includes Alcohol  abuse in her father; Arthritis in her mother; Cancer in her mother; Colon polyps in her maternal grandmother; Heart disease in her father and mother; Hypertension in her mother; Kidney disease in her mother; Obesity in her mother; Vision loss in her father.   Allergies Allergies  Allergen Reactions   Augmentin  [Amoxicillin -Pot Clavulanate] Diarrhea   Bupropion Other (See Comments)    Speech sx- stuttering   Codeine     Patient stated possible due to mother and grandparents being  allergic as well. Per medical history form dated 01/18/11. Prefers not to have.     Home Medications  Prior to Admission medications   Medication Sig Start Date End Date Taking? Authorizing Provider  ALPRAZolam  (XANAX ) 0.25 MG tablet Take 1 tablet (0.25 mg total) by mouth daily as needed for anxiety. 04/18/23  Yes Lucius Krabbe, NP  cetirizine (ZYRTEC) 10 MG tablet Take 10 mg by mouth daily.   Yes [provider]  diltiazem  (DILT-XR) 120 MG 24 hr capsule Take 1 capsule (120 mg total) by mouth daily. 04/15/24  Yes Lucius Krabbe, NP  escitalopram  (LEXAPRO ) 10 MG tablet Take 1 tablet (10 mg total) by mouth daily. 04/15/24  Yes Lucius Krabbe, NP  estradiol  (VIVELLE -DOT) 0.0375 MG/24HR Place 1 patch onto the skin 2 (two) times a week.   Yes [provider]  fluticasone  (FLONASE ) 50 MCG/ACT nasal spray SPRAY 2 SPRAYS INTO EACH NOSTRIL EVERY DAY Patient taking differently: Place 2 sprays into both nostrils daily. 01/14/24  Yes Tobie Eldora NOVAK, MD  levothyroxine  (SYNTHROID ) 112 MCG tablet Take 112 mcg by mouth daily before breakfast.   Yes [provider]  omeprazole  (PRILOSEC) 20 MG capsule Take 1 capsule (20 mg total) by mouth daily. 04/15/24  Yes Hudnell, Krabbe, NP  Azelastine  HCl 137 MCG/SPRAY SOLN PLACE 2 SPRAYS INTO BOTH NOSTRILS 2 (TWO) TIMES DAILY. USE IN [facility] NOSTRIL AS DIRECTED Patient not taking: Reported on 05/20/2024 01/16/24   Tobie Eldora NOVAK, MD  Multiple Vitamin (MULTIVITAMIN ADULT PO) Take 1 tablet by mouth daily. Patient not taking: Reported on 05/20/2024    [provider]

## 2024-05-20 NOTE — Assessment & Plan Note (Signed)
-   20 mEq p.o. K - Repeat with a.m. labs - Replete PRN

## 2024-05-20 NOTE — Care Management Obs Status (Signed)
 MEDICARE OBSERVATION STATUS NOTIFICATION   Patient Details  Name: Alyssa Fisher MRN: [id number] Date of Birth: 20-Dec-1965   Medicare Observation Status Notification Given:  Yes    Vonzell Arrie Sharps 05/20/2024, 9:25 AM

## 2024-05-20 NOTE — Progress Notes (Signed)
 Paged the attending about patient arrival on the floor, attending said she is very busy at ED,and will check on the patient as soon as she can, patient is stable with VSS, voids walking to bathroom with one assist, and no concern as at now, will continue to monitor patient.

## 2024-05-20 NOTE — Progress Notes (Signed)
 PHARMACY - ANTICOAGULATION CONSULT NOTE  Pharmacy Consult for Heparin  Indication: pulmonary embolus  Allergies  Allergen Reactions   Augmentin  [Amoxicillin -Pot Clavulanate] Diarrhea   Bupropion Other (See Comments)    Speech sx- stuttering   Codeine     Patient stated possible due to mother and grandparents being allergic as well. Per medical history form dated 01/18/11. Prefers not to have.    Patient Measurements: Height: 5' 5 (165.1 cm) Weight: 94 kg (207 lb 4.8 oz) IBW/kg (Calculated) : 57 HEPARIN  DW (KG): 78.1  Vital Signs: Temp: 98.3 F (36.8 C) (09/16 0054) Temp Source: Oral (09/16 0054) BP: 138/93 (09/16 0054) Pulse Rate: 102 (09/16 0054)  Labs: Recent Labs    05/19/24 1451 05/19/24 1557 05/20/24 0142  HGB 12.0  --   --   HCT 36.4  --   --   PLT 201  --   --   LABPROT  --  13.6  --   INR  --  1.0  --   HEPARINUNFRC  --   --  0.84*  CREATININE 0.89  --   --     Estimated Creatinine Clearance: 77.1 mL/min (by C-G formula based on SCr of 0.89 mg/dL).  Assessment: 1 yoF presenting with LLE pain and swelling and SOB on exertion. CT angio on 9/15 reveals PE. 9/15 venous US  reveals DVT. Per RN, pt is not on anticoag medications at home. No weight was recorded in EMR at time of dosing. Per RN, pt states she is 90.7 kg. Hgb and PLT WNL  -AM: heparin  level supra-therapeutic on 1400 units/hr. Per RN, no signs/symptoms of bleeding or other issues with the heparin  gtt. Last CBC stable  Goal of Therapy:  Heparin  level 0.3-0.7 units/ml Monitor platelets by anticoagulation protocol: Yes   Plan:  Decrease heparin  infusion to 1250 units/hr Check Heparin  level in 6 hours and daily while on heparin  Continue to monitor H&H and platelets  Lynwood Poplar, PharmD, BCPS Clinical Pharmacist 05/20/2024 2:05 AM

## 2024-05-20 NOTE — Plan of Care (Signed)

## 2024-05-20 NOTE — Hospital Course (Addendum)
 Alyssa Fisher is a 58 y.o. year old female with past medical history of hypertension, hyperlipidemia, prediabetes, hypothyroidism, mitral valve prolapse, insomnia, anxiety, GERD, and arthritis. She presented to [name] ED with a report of left leg pain and swelling with associated increased work of breathing on exertion. The dyspnea on exertion and (L) calf pain began Saturday morning when she noted that her left calf was painful and more edematous.  No noted erythema or history of prior clots.  Patient notes that though her left calf has always been larger than her right she noticed a change approximately 6 weeks ago after she underwent a septoplasty and turbinate reduction for which she did not receive any prophylactic postop anticoagulation.  Denies chest pain, palpitations, nausea, vomiting, fevers, chills, fatigue.  She does report a septoplasty and turbinate reduction approximately 6 weeks ago, she did not receive any prophylactic anticoagulation postop, and takes estrogen hormone replacement therapy.  No recent periods of immobility or long travel.  ED Course: On arrival to [facility] ED patient was noted to be afebrile 36.7 C, BP 127/84, tachycardic with HR 81, RR 16, SpO2 97% on room air. CTA PE obtained and shows bilateral pulmonary emboli with huge clot burden and saddle embolus and thrombus in the right and left pulmonary arteries extending to the lobar branches.  No evidence of right heart strain at this time. (L)LE DVT obtained and shows extensive occlusive thrombus in left lower extremity. Labs notable for sodium 133, potassium 3.4, CO2 18, glucose mildly elevated 127, anion gap 16.  Started on IV heparin  and [facility] contacted for admission.

## 2024-05-20 NOTE — Assessment & Plan Note (Signed)
 Not on any outpatient medication

## 2024-05-20 NOTE — Progress Notes (Signed)
 PHARMACY - ANTICOAGULATION CONSULT NOTE  Pharmacy Consult for Heparin  Indication: pulmonary embolus  Allergies  Allergen Reactions   Augmentin  [Amoxicillin -Pot Clavulanate] Diarrhea   Bupropion Other (See Comments)    Speech sx- stuttering   Codeine     Patient stated possible due to mother and grandparents being allergic as well. Per medical history form dated 01/18/11. Prefers not to have.    Patient Measurements: Height: 5' 5 (165.1 cm) Weight: 94 kg (207 lb 4.8 oz) IBW/kg (Calculated) : 57 HEPARIN  DW (KG): 78.1  Vital Signs: Temp: 99 F (37.2 C) (09/16 1145) Temp Source: Oral (09/16 1145) BP: 119/87 (09/16 1145) Pulse Rate: 72 (09/16 0812)  Labs: Recent Labs    05/19/24 1451 05/19/24 1557 05/20/24 0142 05/20/24 0256 05/20/24 0912  HGB 12.0  --   --   --  12.1  HCT 36.4  --   --   --  36.4  PLT 201  --   --   --  209  LABPROT  --  13.6  --   --   --   INR  --  1.0  --   --   --   HEPARINUNFRC  --   --  0.84*  --  0.41  CREATININE 0.89  --   --   --  0.85  TROPONINIHS  --   --   --  14  --     Estimated Creatinine Clearance: 80.8 mL/min (by C-G formula based on SCr of 0.85 mg/dL).  Assessment: 5 yoF presenting with LLE pain and swelling and SOB on exertion. CT angio on 9/15 reveals PE. 9/15 venous US  reveals DVT. No history of anticoagulant medications.  -heparin  level = 0.41 on heparin  1250 units.hr   Goal of Therapy:  Heparin  level 0.3-0.7 units/ml Monitor platelets by anticoagulation protocol: Yes   Plan:  -Continue heparin  1250 units/hr -Daily heparin  level and CBC  Prentice Poisson, PharmD Clinical Pharmacist **Pharmacist phone directory can now be found on [email or web address] ([email or web address]).  Listed under [facility].

## 2024-05-20 NOTE — Progress Notes (Signed)
 PROGRESS NOTE  Alyssa Fisher  DOB: [birth year]  PCP: Lucius Krabbe, NP FMW:[id number]  DOA: 05/19/2024  LOS: 1 day  Hospital Day: 3  Brief narrative: Alyssa Fisher is a 58 y.o. female with PMH significant for obesity, prediabetes, HTN, HLD, hypothyroidism, GERD, mitral valve prolapse, anxiety, insomnia, arthritis 9/15, patient presented to ED with complaint of left leg pain and swelling for 4 days, associated with increased work of breathing when taking the stairs. Patient reports that though her left calf has always been larger than her right she noticed a change approximately 6 weeks ago after she underwent a septoplasty and turbinate reduction. 2 weeks ago, she was started on HRT with estrogen patch by OB/GYN. No recent periods of immobility or long travel.  No prior history of DVT/PE.  In the ED, patient was afebrile, heart rate in 100s, blood pressure in 120s, breathing on room air Labs with CBC unremarkable, troponin not elevated, potassium 3.4, sodium 133 Ultrasound duplex of left lower extremity showed extensive occlusive thrombus at the left common femoral vein, saphenofemoral junction, great saphenous vein profundus femoral vein, superficial femoral vein, popliteal vein, posterior tibial vein and right gastrocnemius vein.  CT angio chest showed bilateral pulmonary emboli with high clot burden. This includes saddle embolus and thrombus in the right and left pulmonary arteries extending into the lobar branches. Right ventricular to left ventricular ratio currently within normal limits.  Patient was started on IV heparin  drip Hospitalist service was consulted for inpatient management  Subjective: Patient was seen and examined this morning.  Pleasant middle-aged Caucasian female.  Propped up in bed.  Not in distress.  Not on supplemental oxygen.  Remains on heparin  drip. Has been up and walking to the bathroom but has not been out in the hallway. Chart reviewed. Remains  hemodynamically stable and breathing on room air.  Assessment and plan: Acute pulmonary embolism Acute DVT Presenting with left calf swelling, shortness of breath Imagings showed extensive left LE DVT and high clot burden PE Only risk factor seems to be HRT that was started 2 weeks prior.  No prior history of DVT/PE Seen by pulmonary.  No indication of thrombolysis Currently on heparin  drip Echocardiogram showed EF 60 to 65%, no WMA, right ventricular size and systolic function normal. Given the size of clot burden and symptoms on ambulation, I would continue heparin  drip for next 24 to 48 hours and subsequently switch to oral Eliquis /Xarelto.  Hypokalemia potassium level improved with replacement. 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  Local Benadryl  ointment ordered.   Mobility: Independently able to ambulate at home.  I have encouraged nursing staff to ambulate her today and also check oxygen requirement.  Goals of care   Code Status: Full Code     DVT prophylaxis: Heparin  drip    Antimicrobials: None Fluid: None Consultants: Pulmonary Family Communication: None at bedside  Status: Inpatient Level of care:  Progressive   Patient is from: Home Needs to continue in-hospital care: Needs  heparin  drip for next 24 to 48 hours Anticipated d/c to: Hopefully home in 1 to 2 days    Diet:  Diet Order             Diet Heart Room service appropriate? Yes;  Fluid consistency: Thin  Diet effective now                   Scheduled Meds:  diltiazem   120 mg Oral Daily   escitalopram   10 mg Oral Daily   fluticasone   1 spray Each Nare Daily   insulin  aspart  0-5 Units Subcutaneous QHS   insulin  aspart  0-9 Units Subcutaneous TID WC   levothyroxine   112 mcg Oral Daily   pantoprazole   40 mg Oral Daily    PRN meds: acetaminophen  **OR** acetaminophen , diphenhydrAMINE -zinc  acetate, melatonin, ondansetron  **OR** ondansetron  (ZOFRAN ) IV, mouth rinse, polyethylene glycol   Infusions:   heparin  1,250 Units/hr (05/21/24 0622)    Antimicrobials: Anti-infectives (From admission, onward)    None       Objective: Vitals:   05/21/24 0513 05/21/24 0740  BP: 107/71 121/77  Pulse: 90 80  Resp: 15 16  Temp: 98.5 F (36.9 C) 98.4 F (36.9 C)  SpO2: 93% 95%    Intake/Output Summary (Last 24 hours) at 05/21/2024 1108 Last data filed at 05/20/2024 1554 Gross per 24 hour  Intake 565.99 ml  Output --  Net 565.99 ml   Filed Weights   05/19/24 1752 05/20/24 0054  Weight: 90.7 kg 94 kg   Weight change:  Body mass index is 34.5 kg/m.   Physical Exam: General exam: Pleasant, middle-aged Caucasian female.  Not in distress currently Skin: No rashes, lesions or ulcers. HEENT: Atraumatic, normocephalic, no obvious bleeding Lungs: Clear to auscultation bilaterally,  CVS: S1, S2, no murmur,   GI/Abd: Soft, nontender, nondistended, bowel sound present,   CNS: Alert, awake, oriented x 3 Psychiatry: Mood appropriate Extremities: Left lower extremity edema and mild tenderness noted. Data Review: I have personally reviewed the laboratory data and studies available.  F/u labs ordered Unresulted Labs (From admission, onward)     Start     Ordered   05/20/24 0900  CBC  Daily,   R      05/20/24 0206            Signed, Chapman Rota, MD [facility] 05/21/2024

## 2024-05-20 NOTE — TOC CM/SW Note (Signed)
 Transition of Care [facility]) - Inpatient Brief Assessment   Patient Details  Name: Alyssa Fisher MRN: [id number] Date of Birth: 05/12/1966  Transition of Care [facility]) CM/SW Contact:    Sudie Erminio Deems, RN Phone Number: 05/20/2024, 2:33 PM   Clinical Narrative: Patient presented for LLE pain/swelling and shortness of breath. PTA patient was from home with her mother and her 59 year old son. Patient is legally blind and she states she uses DME cane and her mother drives her to PCP appointments. Patient states she has no issues obtaining medications. No further home needs identified from Inpatient Case Manager     Transition of Care Asessment: Insurance and Status: Insurance coverage has been reviewed Patient has primary care physician: Yes Home environment has been reviewed: reviewed Prior level of function:: independent Prior/Current Home Services: No current home services Social Drivers of Health Review: SDOH reviewed no interventions necessary Readmission risk has been reviewed: Yes Transition of care needs: no transition of care needs at this time

## 2024-05-20 NOTE — Telephone Encounter (Signed)
 Patient Product/process development scientist completed.    The patient is insured through [facility]. Patient has Medicare and is not eligible for a copay card, but may be able to apply for patient assistance or Medicare RX Payment Plan (Patient Must reach out to their plan, if eligible for payment plan), if available.    Ran test claim for Eliquis  5 mg and the current 30 day co-pay is $0.00.  Ran test claim for Xarelto 20 mg and the current 30 day co-pay is $0.00.  This test claim was processed through [facility]- copay amounts may vary at other pharmacies due to pharmacy/plan contracts, or as the patient moves through the different stages of their insurance plan.     Reyes Sharps, CPHT Pharmacy Technician III Certified Patient Advocate [facility] Patient Advocate Team Direct Number: [phone]  Fax: [phone]

## 2024-05-20 NOTE — H&P (Addendum)
 History and Physical    Alyssa Fisher FMW:[id number] DOB: 1966/02/06 DOA: 05/19/2024  DOS: the patient was seen and examined on 05/19/2024  PCP: Alyssa Krabbe, NP   Patient coming from: Home  I have personally briefly reviewed patient's old medical records in [facility] and CareEverywhere  HPI:  Alyssa Fisher is a 58 y.o. year old female with past medical history of hypertension, hyperlipidemia, prediabetes, hypothyroidism, mitral valve prolapse, insomnia, anxiety, GERD, and arthritis. She presented to [name] ED with a report of left leg pain and swelling with associated increased work of breathing on exertion. The dyspnea on exertion and (L) calf pain began Saturday morning when she noted that her left calf was painful and more edematous.  No noted erythema or history of prior clots.  Patient notes that though her left calf has always been larger than her right she noticed a change approximately 6 weeks ago after she underwent a septoplasty and turbinate reduction for which she did not receive any prophylactic postop anticoagulation.  Denies chest pain, palpitations, nausea, vomiting, fevers, chills, fatigue.  She does report a septoplasty and turbinate reduction approximately 6 weeks ago, she did not receive any prophylactic anticoagulation postop, and takes estrogen hormone replacement therapy.  No recent periods of immobility or long travel.  ED Course: On arrival to [facility] ED patient was noted to be afebrile 36.7 C, BP 127/84, tachycardic with HR 81, RR 16, SpO2 97% on room air. CTA PE obtained and shows bilateral pulmonary emboli with huge clot burden and saddle embolus and thrombus in the right and left pulmonary arteries extending to the lobar branches.  No evidence of right heart strain at this time. (L)LE DVT obtained and shows extensive occlusive thrombus in left lower extremity. Labs notable for sodium 133, potassium 3.4, CO2 18, glucose mildly elevated 127, anion gap  16.  Started on IV heparin  and TRH contacted for admission.    Review of Systems: As mentioned in the history of present illness. All other systems reviewed and are negative.  Review of Systems  Constitutional:  Negative for chills, fever and malaise/fatigue.  HENT:  Negative for congestion.   Respiratory:  Positive for cough.   Cardiovascular:  Positive for leg swelling. Negative for chest pain and palpitations.  Gastrointestinal:  Positive for heartburn. Negative for abdominal pain, diarrhea, nausea and vomiting.  Musculoskeletal:  Negative for myalgias.  Neurological:  Negative for dizziness, focal weakness, weakness and headaches.    Past Medical History:  Diagnosis Date   Anxiety    Broken ankle    Chronic left-sided lumbar radiculopathy 01/05/2022   Contraceptive management 11/21/2012   Depression    Dizziness - light-headed/fainting 02/20/2011   Dysphagia 10/23/2012   due to thyroid  issues   Dysphagia 03/01/2011   Elevated LDL cholesterol level 05/22/2022   Fatigue/Loss of Sleep 02/20/2011   FHx: allergies 02/20/2011   Ask patient to clarify. Only had allergies noted. Need more detail per medical history form dated 01/18/11.   Generalized headaches 02/20/2011   GERD (gastroesophageal reflux disease)    History of tonsillectomy    Hoarseness/sore throat 02/20/2011   Hypertension    Miscarriage    One - date not on medical form.   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   Rosacea    Runny nose 02/20/2011   Seasonal allergies    uses albuterol  inhaler as needed for allergies   Sinus problem 02/20/2011   Thyroid  goiter 10/23/2012   multinodular- has been followed x 57yrs   Wears glasses/contacts 02/20/2011    Past Surgical History:   Procedure Laterality Date   ABDOMINAL HYSTERECTOMY     ANTERIOR AND POSTERIOR REPAIR N/A 02/21/2017   Procedure: ANTERIOR (CYSTOCELE) AND POSTERIOR REPAIR (RECTOCELE);  Surgeon: Horacio Boas, MD;  Location: [facility];  Service: Gynecology;  Laterality: N/A;   BILATERAL SALPINGECTOMY N/A 02/21/2017   Procedure: BILATERAL SALPINGECTOMY;  Surgeon: Horacio Boas, MD;  Location: [facility];  Service: Gynecology;  Laterality: N/A;   BLADDER SUSPENSION N/A 02/21/2017   Procedure: Solyx Sling;  Surgeon: Horacio Boas, MD;  Location: [facility];  Service: Gynecology;  Laterality: N/A;   broken ankle Right    Pins/rod inserted-retained   COLONOSCOPY     DILATION AND CURETTAGE OF UTERUS     MAB   FRACTURE SURGERY     ORIF ANKLE FRACTURE Right 12/19/2018   Procedure: Removal of hardware and open reduction internal fixation of right ankle bimalleolar fracture;  Surgeon: Kit Rush, MD;  Location: [facility];  Service: Orthopedics;  Laterality: Right;  ok to open room per Tammy at [facility]   SEPTOPLASTY     THYROIDECTOMY N/A 10/29/2012   Procedure: THYROIDECTOMY;  Surgeon: Boas CHRISTELLA Spinner, MD;  Location: [facility];  Service: General;  Laterality: N/A;  Total Thyroidectomy   TONSILLECTOMY     VAGINAL HYSTERECTOMY Bilateral 02/21/2017   Procedure: HYSTERECTOMY VAGINAL;  Surgeon: Horacio Boas, MD;  Location: [facility];  Service: Gynecology;  Laterality: Bilateral;   WISDOM TOOTH EXTRACTION       reports that she has never smoked. She has never used smokeless tobacco. She reports that she does not currently use alcohol . She reports that she does not use drugs.  Allergies  Allergen Reactions   Augmentin  [Amoxicillin -Pot Clavulanate] Diarrhea   Bupropion Other (See Comments)    Speech sx- stuttering   Codeine     Patient stated possible due to mother and grandparents being allergic as well. Per medical history form dated 01/18/11. Prefers not to have.    Family History  Problem  Relation Age of Onset   Arthritis Mother    Cancer Mother    Heart disease Mother    Hypertension Mother    Kidney disease Mother    Obesity Mother    Heart disease Father    Alcohol  abuse Father    Vision loss Father    Colon polyps Maternal Grandmother     Prior to Admission medications   Medication Sig Start Date End Date Taking? Authorizing Provider  diltiazem  (DILT-XR) 120 MG 24 hr capsule Take 1 capsule (120 mg total) by mouth daily. 04/15/24  Yes Alyssa Krabbe, NP  escitalopram  (LEXAPRO ) 10 MG tablet Take 1 tablet (10 mg total) by mouth daily. 04/15/24  Yes Alyssa Krabbe, NP  levothyroxine  (SYNTHROID ) 125 MCG tablet Take 1 tablet (125 mcg total) by mouth daily. 03/13/24  Yes Motwani, Komal, MD  omeprazole  (PRILOSEC) 20 MG capsule Take 1 capsule (20 mg total) by mouth daily. 04/15/24  Yes Alyssa Krabbe, NP  ALPRAZolam  (XANAX ) 0.25 MG tablet Take 1 tablet (0.25 mg total) by mouth daily as needed for anxiety. 04/18/23   Alyssa Krabbe, NP  Azelastine  HCl 137 MCG/SPRAY SOLN PLACE 2 SPRAYS INTO BOTH NOSTRILS 2 (  TWO) TIMES DAILY. USE IN EACH NOSTRIL AS DIRECTED 01/16/24   Tobie Comp B, MD  cetirizine (ZYRTEC) 10 MG tablet Take 10 mg by mouth daily.    [provider]  fluticasone  (FLONASE ) 50 MCG/ACT nasal spray SPRAY 2 SPRAYS INTO EACH NOSTRIL EVERY DAY 01/14/24   Patel, Kunjan B, MD  Multiple Vitamin (MULTIVITAMIN ADULT PO) Take by mouth.    [provider]    Physical Exam: Vitals:   05/19/24 2345 05/20/24 0054 05/20/24 0340 05/20/24 0812  BP: 123/88 (!) 138/93 104/85 113/72  Pulse: 93 (!) 102 96 72  Resp: 18 18 18 18   Temp:  98.3 F (36.8 C) 98.9 F (37.2 C) 98.4 F (36.9 C)  TempSrc:  Oral Oral Oral  SpO2: 95% 94% 98% 95%  Weight:  94 kg    Height:  5' 5 (1.651 m)      Physical Exam Vitals and nursing note reviewed.  Constitutional:      Appearance: She is obese.  HENT:     Head: Normocephalic.  Eyes:     Pupils: Pupils are  equal, round, and reactive to light.  Cardiovascular:     Rate and Rhythm: Normal rate and regular rhythm.     Heart sounds: No murmur heard.    No friction rub. No gallop.  Pulmonary:     Effort: Pulmonary effort is normal.     Breath sounds: Normal breath sounds. No wheezing, rhonchi or rales.  Abdominal:     General: Bowel sounds are normal. There is no distension.     Palpations: Abdomen is soft.     Tenderness: There is no abdominal tenderness. There is no guarding.  Musculoskeletal:        General: Swelling present. No tenderness.     Comments: (L) calf swelling  Skin:    General: Skin is warm and dry.     Capillary Refill: Capillary refill takes less than 2 seconds.  Neurological:     General: No focal deficit present.     Mental Status: She is alert and oriented to person, place, and time. Mental status is at baseline.       Labs on Admission: I have personally reviewed following labs and imaging studies  CBC: Recent Labs  Lab 05/19/24 1451  WBC 8.9  NEUTROABS 5.9  HGB 12.0  HCT 36.4  MCV 86.7  PLT 201   Basic Metabolic Panel: Recent Labs  Lab 05/19/24 1451  NA 133*  K 3.4*  CL 98  CO2 18*  GLUCOSE 127*  BUN 11  CREATININE 0.89  CALCIUM  9.3   GFR: Estimated Creatinine Clearance: 77.1 mL/min (by C-G formula based on SCr of 0.89 mg/dL). Liver Function Tests: Recent Labs  Lab 05/19/24 1451  AST 17  ALT 7  ALKPHOS 101  BILITOT 0.5  PROT 7.4  ALBUMIN 3.8   No results for input(s): LIPASE, AMYLASE in the last 168 hours. No results for input(s): AMMONIA in the last 168 hours. Coagulation Profile: Recent Labs  Lab 05/19/24 1557  INR 1.0   Cardiac Enzymes: Recent Labs  Lab 05/20/24 0256  TROPONINIHS 14   BNP (last 3 results) No results for input(s): BNP in the last 8760 hours. HbA1C: No results for input(s): HGBA1C in the last 72 hours. CBG: Recent Labs  Lab 05/20/24 0822  GLUCAP 115*   Lipid Profile: No results for  input(s): CHOL, HDL, LDLCALC, TRIG, CHOLHDL, LDLDIRECT in the last 72 hours. Thyroid  Function Tests: No results for input(s):  TSH, T4TOTAL, FREET4, T3FREE, THYROIDAB in the last 72 hours. Anemia Panel: No results for input(s): VITAMINB12, FOLATE, FERRITIN, TIBC, IRON, RETICCTPCT in the last 72 hours. Urine analysis: No results found for: COLORURINE, APPEARANCEUR, LABSPEC, PHURINE, GLUCOSEU, HGBUR, BILIRUBINUR, KETONESUR, PROTEINUR, UROBILINOGEN, NITRITE, LEUKOCYTESUR  Radiological Exams on Admission: I have personally reviewed images CT Angio Chest PE W and/or Wo Contrast Addendum Date: 05/19/2024 ADDENDUM REPORT: 05/19/2024 17:43 ADDENDUM: The original report was by Dr. Ryan Salvage. The following addendum is by Dr. Ryan Salvage: Critical Value/emergent results were called by telephone at the time of interpretation on 05/19/2024 at 5:39 pm to provider Dr. Freddi, who verbally acknowledged these results. Electronically Signed   By: Ryan Salvage M.D.   On: 05/19/2024 17:43   Result Date: 05/19/2024 CLINICAL DATA:  Shortness of breath.  Left leg swelling. EXAM: CT ANGIOGRAPHY CHEST WITH CONTRAST TECHNIQUE: Multidetector CT imaging of the chest was performed using the standard protocol during bolus administration of intravenous contrast. Multiplanar CT image reconstructions and MIPs were obtained to evaluate the vascular anatomy. RADIATION DOSE REDUCTION: This exam was performed according to the departmental dose-optimization program which includes automated exposure control, adjustment of the mA and/or kV according to patient size and/or use of iterative reconstruction technique. CONTRAST:  75mL OMNIPAQUE  IOHEXOL  350 MG/ML SOLN COMPARISON:  None Available. FINDINGS: Cardiovascular: Left pulmonary artery embolus extends into the upper and lower lobe branches and segmental branches. Right pulmonary artery embolus extends in upper,  middle, lower lobe branches. Small band of saddle embolus extends between the right and left pulmonary arteries. Clot burden is high. Right ventricular to left ventricular ratio 0.76, not elevated. Mild cardiomegaly. Mediastinum/Nodes: Unremarkable Lungs/Pleura: Mild atelectasis in the posterior basal segment right lower lobe. Upper Abdomen: Unremarkable Musculoskeletal: Unremarkable Review of the MIP images confirms the above findings. IMPRESSION: 1. Bilateral pulmonary emboli with high clot burden. This includes saddle embolus and thrombus in the right and left pulmonary arteries extending into the lobar branches. Right ventricular to left ventricular ratio currently within normal limits. 2. Mild cardiomegaly. 3. Mild atelectasis in the posterior basal segment right lower lobe. Radiology assistant personnel have been notified to put me in telephone contact with the referring physician or the referring physician's clinical representative in order to discuss these findings. Once this communication is established I will issue an addendum to this report for documentation purposes. Electronically Signed: By: Ryan Salvage M.D. On: 05/19/2024 17:21   US  Venous Img Lower Unilateral Left Result Date: 05/19/2024 CLINICAL DATA:  Shortness of breath, left calf and leg pain and edema for 3 days EXAM: LEFT LOWER EXTREMITY VENOUS DOPPLER ULTRASOUND TECHNIQUE: Gray-scale sonography with compression, as well as color and duplex ultrasound, were performed to evaluate the deep venous system(s) from the level of the common femoral vein through the popliteal and proximal calf veins. COMPARISON:  None Available. FINDINGS: VENOUS Occlusive thrombus at the left common femoral vein, saphenofemoral junction, profundus femoral vein, superficial femoral vein, and popliteal vein. Thrombus extends into the posterior tibial vein and right gastrocnemius vein. The left peroneal vein was not well visualized. Thrombus is present in the  great saphenous vein in the thigh. Limited views of the contralateral common femoral vein are unremarkable. OTHER None. Limitations: none IMPRESSION: 1. Extensive occlusive thrombus in the left lower extremity compatible with DVT. This result will be discussed with the referring provider along with the findings of positive pulmonary embolus from the chest CT angiogram with communication documented on the chest CT angiogram report. Electronically Signed  By: Ryan Salvage M.D.   On: 05/19/2024 17:24    EKG: My personal interpretation of EKG shows: NSR HR 93    Assessment/Plan Principal Problem:   Acute pulmonary embolism (HCC) Active Problems:   Hypokalemia   Essential hypertension   Prediabetes   Mixed hyperlipidemia   Hypothyroidism, postsurgical   Depression with anxiety    Assessment and Plan: * Acute pulmonary embolism (HCC) Provoked, (L) calf swelling began shortly after surgery ~6 weeks ago CTA PE: Showing bilateral pulmonary emboli with high clot burden and saddle embolus and thrombus in the right and left pulmonary arteries extending into the lobar branches without evidence of right heart strain. PESI 79, class II Symptoms: Left calf pain and dyspnea on exertion Tachycardia: HR 110 on ED presentation; O2 requirement room air ;SPO2 95 to 100% - Currently on IV heparin , continue - ECHO - (L)LE DVT -showed extensive occlusive DVT - Troponin negative at 14 - PCCM consulted, appreciate their recommendations and management   Hypokalemia - 20 mEq p.o. K - Repeat with a.m. labs - Replete PRN  Essential hypertension - Continue home diltiazem   Prediabetes A1c 5.8% approximately 55-month ago - AC/at bedtime SSI and CBG monitoring while inpatient  Mixed hyperlipidemia Not on any outpatient medication  Hypothyroidism, postsurgical - Continue home Synthroid   Depression with anxiety - Continue home Lexapro        VTE prophylaxis:  IV heparin  gtts  GI  prophylaxis: Protonix  Diet: Heart healthy Access: PIV Lines: None Code Status:  Full Code Telemetry: Yes  Admission status: Observation, Progressive Patient is from: Home Anticipated d/c is to: Home Anticipated d/c date is: 1-2 days Patient currently: Awaiting echo and pending transition from IV anticoagulation to p.o.  Family Communication:  No family at bedside. Plan of care discussed with patient at bedside.  Questions welcomed and elicited.  Questions answered to patient's expressed satisfaction.  Patient is cognitively able to update family independently if desired.   Consults called: N/A    Severity of Illness: The appropriate patient status for this patient is OBSERVATION. Observation status is judged to be reasonable and necessary in order to provide the required intensity of service to ensure the patient's safety. The patient's presenting symptoms, physical exam findings, and initial radiographic and laboratory data in the context of their medical condition is felt to place them at decreased risk for further clinical deterioration. Furthermore, it is anticipated that the patient will be medically stable for discharge from the hospital within 2 midnights of admission.   To reach the provider On-Call:   7AM- 7PM see care teams to locate the attending and reach out to them via [email or web address] Password: [email or web address] 7PM-7AM contact night-coverage If you still have difficulty reaching the appropriate provider, please page the [facility] (Director on Call) for [facility] on amion for assistance  This document was prepared using Conservation officer, historic buildings and may include unintentional dictation errors.  Rockie Rams FNP-BC, PMHNP-BC Nurse Practitioner [facility]

## 2024-05-20 NOTE — Assessment & Plan Note (Addendum)
 A1c 5.8% approximately 81-month ago - AC/at bedtime SSI and CBG monitoring while inpatient

## 2024-05-20 NOTE — Progress Notes (Signed)
  Echocardiogram 2D Echocardiogram has been performed.  Alyssa Fisher 05/20/2024, 2:10 PM

## 2024-05-20 NOTE — Assessment & Plan Note (Addendum)
 Provoked, (L) calf swelling began shortly after surgery ~6 weeks ago CTA PE: Showing bilateral pulmonary emboli with high clot burden and saddle embolus and thrombus in the right and left pulmonary arteries extending into the lobar branches without evidence of right heart strain. PESI 79, class II Symptoms: Left calf pain and dyspnea on exertion Tachycardia: HR 110 on ED presentation; O2 requirement room air ;SPO2 95 to 100% - Currently on IV heparin , continue - ECHO - (L)LE DVT -showed extensive occlusive DVT - Troponin negative at 14 - PCCM consulted, appreciate their recommendations and management

## 2024-05-20 NOTE — Assessment & Plan Note (Signed)
 Continue home Lexapro

## 2024-05-20 NOTE — Assessment & Plan Note (Signed)
 Continue home Synthroid

## 2024-05-20 NOTE — Assessment & Plan Note (Signed)
-  Continue home diltiazem 

## 2024-05-21 DIAGNOSIS — I2699 Other pulmonary embolism without acute cor pulmonale: Secondary | ICD-10-CM | POA: Diagnosis not present

## 2024-05-21 LAB — CBC
HCT: 35.3 % — ABNORMAL LOW (ref 36.0–46.0)
Hemoglobin: 11.2 g/dL — ABNORMAL LOW (ref 12.0–15.0)
MCH: 28.8 pg (ref 26.0–34.0)
MCHC: 31.7 g/dL (ref 30.0–36.0)
MCV: 90.7 fL (ref 80.0–100.0)
Platelets: 202 K/uL (ref 150–400)
RBC: 3.89 MIL/uL (ref 3.87–5.11)
RDW: 13.5 % (ref 11.5–15.5)
WBC: 7.2 K/uL (ref 4.0–10.5)
nRBC: 0 % (ref 0.0–0.2)

## 2024-05-21 LAB — GLUCOSE, CAPILLARY
Glucose-Capillary: 110 mg/dL — ABNORMAL HIGH (ref 70–99)
Glucose-Capillary: 101 mg/dL — ABNORMAL HIGH (ref 70–99)
Glucose-Capillary: 100 mg/dL — ABNORMAL HIGH (ref 70–99)
Glucose-Capillary: 119 mg/dL — ABNORMAL HIGH (ref 70–99)

## 2024-05-21 LAB — BASIC METABOLIC PANEL WITH GFR
Anion gap: 17 — ABNORMAL HIGH (ref 5–15)
BUN: 9 mg/dL (ref 6–20)
CO2: 22 mmol/L (ref 22–32)
Calcium: 8.8 mg/dL — ABNORMAL LOW (ref 8.9–10.3)
Chloride: 99 mmol/L (ref 98–111)
Creatinine, Ser: 1 mg/dL (ref 0.44–1.00)
GFR, Estimated: >60 mL/min (ref >60)
Glucose, Bld: 101 mg/dL — ABNORMAL HIGH (ref 70–99)
Potassium: 4.1 mmol/L (ref 3.5–5.1)
Sodium: 138 mmol/L (ref 135–145)

## 2024-05-21 LAB — HEPARIN LEVEL (UNFRACTIONATED): Heparin Unfractionated: 0.42 [IU]/mL (ref 0.30–0.70)

## 2024-05-21 MED ORDER — DIPHENHYDRAMINE-ZINC ACETATE 2-0.1 % EX CREA
TOPICAL_CREAM | Freq: Three times a day (TID) | CUTANEOUS | Status: DC | PRN
  Filled 2024-05-21: qty 28

## 2024-05-21 NOTE — Progress Notes (Signed)
 PHARMACY - ANTICOAGULATION CONSULT NOTE  Pharmacy Consult for Heparin  Indication: pulmonary embolus  Allergies  Allergen Reactions   Augmentin  [Amoxicillin -Pot Clavulanate] Diarrhea   Bupropion Other (See Comments)    Speech sx- stuttering   Codeine     Patient stated possible due to mother and grandparents being allergic as well. Per medical history form dated 01/18/11. Prefers not to have.    Patient Measurements: Height: 5' 5 (165.1 cm) Weight: 94 kg (207 lb 4.8 oz) IBW/kg (Calculated) : 57 HEPARIN  DW (KG): 78.1  Vital Signs: Temp: 98.4 F (36.9 C) (09/17 0740) Temp Source: Oral (09/17 0740) BP: 121/77 (09/17 0740) Pulse Rate: 80 (09/17 0740)  Labs: Recent Labs    05/19/24 1451 05/19/24 1557 05/20/24 0142 05/20/24 0256 05/20/24 0912 05/21/24 0536 05/21/24 0941  HGB 12.0  --   --   --  12.1 11.2*  --   HCT 36.4  --   --   --  36.4 35.3*  --   PLT 201  --   --   --  209 202  --   LABPROT  --  13.6  --   --   --   --   --   INR  --  1.0  --   --   --   --   --   HEPARINUNFRC  --   --  0.84*  --  0.41  --  0.42  CREATININE 0.89  --   --   --  0.85 1.00  --   TROPONINIHS  --   --   --  14  --   --   --     Estimated Creatinine Clearance: 68.7 mL/min (by C-G formula based on SCr of 1 mg/dL).  Assessment: 59 yoF presenting with LLE pain and swelling and SOB on exertion. CT angio on 9/15 reveals PE. 9/15 venous US  reveals DVT. No history of anticoagulant medications.  -heparin  level = 0.42 on heparin  1250 units/hr -cost of apixaban /rivaroxaban= $0   Goal of Therapy:  Heparin  level 0.3-0.7 units/ml Monitor platelets by anticoagulation protocol: Yes   Plan:  -Continue heparin  1250 units/hr -Daily heparin  level and CBC  Prentice Poisson, PharmD Clinical Pharmacist **Pharmacist phone directory can now be found on [email or web address] (PW [email or web address]).  Listed under [facility].

## 2024-05-21 NOTE — Plan of Care (Signed)

## 2024-05-22 ENCOUNTER — Other Ambulatory Visit (HOSPITAL_COMMUNITY): Payer: Self-pay

## 2024-05-22 LAB — CBC
HCT: 32.5 % — ABNORMAL LOW (ref 36.0–46.0)
Hemoglobin: 10.5 g/dL — ABNORMAL LOW (ref 12.0–15.0)
MCH: 29 pg (ref 26.0–34.0)
MCHC: 32.3 g/dL (ref 30.0–36.0)
MCV: 89.8 fL (ref 80.0–100.0)
Platelets: 217 K/uL (ref 150–400)
RBC: 3.62 MIL/uL — ABNORMAL LOW (ref 3.87–5.11)
RDW: 13.2 % (ref 11.5–15.5)
WBC: 7.9 K/uL (ref 4.0–10.5)
nRBC: 0 % (ref 0.0–0.2)

## 2024-05-22 LAB — GLUCOSE, CAPILLARY
Glucose-Capillary: 105 mg/dL — ABNORMAL HIGH (ref 70–99)
Glucose-Capillary: 103 mg/dL — ABNORMAL HIGH (ref 70–99)

## 2024-05-22 LAB — HEPARIN LEVEL (UNFRACTIONATED): Heparin Unfractionated: 0.42 [IU]/mL (ref 0.30–0.70)

## 2024-05-22 MED ORDER — APIXABAN (ELIQUIS) VTE STARTER PACK (10MG AND 5MG)
ORAL_TABLET | ORAL | 0 refills | Status: DC
  Filled 2024-05-22: qty 74, 30d supply, fill #0

## 2024-05-22 MED ORDER — DIPHENHYDRAMINE-ZINC ACETATE 2-0.1 % EX CREA
TOPICAL_CREAM | Freq: Three times a day (TID) | CUTANEOUS | 0 refills | Status: AC | PRN
  Filled 2024-05-22: qty 28.4, fill #0

## 2024-05-22 MED ORDER — APIXABAN 5 MG PO TABS
10.0000 mg | ORAL_TABLET | Freq: Two times a day (BID) | ORAL | Status: DC
Start: 2024-05-22 — End: 2024-05-22
  Administered 2024-05-22: 10 mg via ORAL
  Filled 2024-05-22: qty 2

## 2024-05-22 MED ORDER — APIXABAN 5 MG PO TABS: 5.0000 mg | ORAL_TABLET | Freq: Two times a day (BID) | ORAL | Status: DC

## 2024-05-22 NOTE — Progress Notes (Signed)
 PHARMACY - ANTICOAGULATION CONSULT NOTE  Pharmacy Consult for Heparin  Indication: pulmonary embolus  Allergies  Allergen Reactions   Augmentin  [Amoxicillin -Pot Clavulanate] Diarrhea   Bupropion Other (See Comments)    Speech sx- stuttering   Codeine     Patient stated possible due to mother and grandparents being allergic as well. Per medical history form dated 01/18/11. Prefers not to have.    Patient Measurements: Height: 5' 5 (165.1 cm) Weight: 94 kg (207 lb 4.8 oz) IBW/kg (Calculated) : 57 HEPARIN  DW (KG): 78.1  Vital Signs: Temp: 98.5 F (36.9 C) (09/18 0727) Temp Source: Oral (09/18 0727) BP: 117/74 (09/18 0727) Pulse Rate: 71 (09/18 0727)  Labs: Recent Labs    05/19/24 1451 05/19/24 1557 05/20/24 0142 05/20/24 0256 05/20/24 0912 05/21/24 0536 05/21/24 0941 05/22/24 0447  HGB 12.0  --   --   --  12.1 11.2*  --  10.5*  HCT 36.4  --   --   --  36.4 35.3*  --  32.5*  PLT 201  --   --   --  209 202  --  217  LABPROT  --  13.6  --   --   --   --   --   --   INR  --  1.0  --   --   --   --   --   --   HEPARINUNFRC  --   --    < >  --  0.41  --  0.42 0.42  CREATININE 0.89  --   --   --  0.85 1.00  --   --   TROPONINIHS  --   --   --  14  --   --   --   --    < > = values in this interval not displayed.    Estimated Creatinine Clearance: 68.7 mL/min (by C-G formula based on SCr of 1 mg/dL).  Assessment: 89 yoF presenting with LLE pain and swelling and SOB on exertion. CT angio on 9/15 reveals PE. 9/15 venous US  reveals DVT. No history of anticoagulant medications.  -heparin  level = 0.42 on heparin  1250 units/hr -cost of apixaban /rivaroxaban= $0   Goal of Therapy:  Heparin  level 0.3-0.7 units/ml Monitor platelets by anticoagulation protocol: Yes   Plan:  -Continue heparin  1250 units/hr -Daily heparin  level and CBC  Prentice Poisson, PharmD Clinical Pharmacist **Pharmacist phone directory can now be found on [email or web address] ([email or web address]).  Listed under [facility]  Pharmacy.

## 2024-05-22 NOTE — Discharge Instructions (Signed)
 Information on my medicine - ELIQUIS  (apixaban )  Why was Eliquis  prescribed for you? Eliquis  was prescribed to treat blood clots that may have been found in the veins of your legs (deep vein thrombosis) or in your lungs (pulmonary embolism) and to reduce the risk of them occurring again.  What do You need to know about Eliquis  ? The starting dose is 10 mg (two 5 mg tablets) taken TWICE daily for the FIRST SEVEN (7) DAYS, then on 05/29/24  the dose is reduced to ONE 5 mg tablet taken TWICE daily.  Eliquis  may be taken with or without food.   Try to take the dose about the same time in the morning and in the evening. If you have difficulty swallowing the tablet whole please discuss with your pharmacist how to take the medication safely.  Take Eliquis  exactly as prescribed and DO NOT stop taking Eliquis  without talking to the doctor who prescribed the medication.  Stopping may increase your risk of developing a new blood clot.  Refill your prescription before you run out.  After discharge, you should have regular check-up appointments with your healthcare provider that is prescribing your Eliquis .    What do you do if you miss a dose? If a dose of ELIQUIS  is not taken at the scheduled time, take it as soon as possible on the same day and twice-daily administration should be resumed. The dose should not be doubled to make up for a missed dose.  Important Safety Information A possible side effect of Eliquis  is bleeding. You should call your healthcare provider right away if you experience any of the following: Bleeding from an injury or your nose that does not stop. Unusual colored urine (red or dark brown) or unusual colored stools (red or black). Unusual bruising for unknown reasons. A serious fall or if you hit your head (even if there is no bleeding).  Some medicines may interact with Eliquis  and might increase your risk of bleeding or clotting while on Eliquis . To help avoid this,  consult your healthcare provider or pharmacist prior to using any new prescription or non-prescription medications, including herbals, vitamins, non-steroidal anti-inflammatory drugs (NSAIDs) and supplements.  This website has more information on Eliquis  (apixaban ): [email or web address]

## 2024-05-22 NOTE — Discharge Summary (Signed)
 Physician Discharge Summary  Alyssa Fisher FMW:[id number] DOB: [birth year] DOA: 05/19/2024  PCP: Lucius Krabbe, NP  Admit date: 05/19/2024 Discharge date: 05/22/2024  Admitted from: Home Discharge disposition: Home  Recommendations at discharge:  Continue Eliquis  for at least 6 months - 10 mg twice daily for 7 days to be followed by 5 mg twice daily.   Outpatient referral given per hematology evaluation Left leg swelling will take several days to improve.  Recommend to keep leg elevated.   Brief narrative: Alyssa Fisher is a 58 y.o. female with PMH significant for obesity, prediabetes, HTN, HLD, hypothyroidism, GERD, mitral valve prolapse, anxiety, insomnia, arthritis 9/15, patient presented to ED with complaint of left leg pain and swelling for 4 days, associated with increased work of breathing when taking the stairs. Patient reports that though her left calf has always been larger than her right she noticed a change approximately 6 weeks ago after she underwent a septoplasty and turbinate reduction. 2 weeks ago, she was started on HRT with estrogen patch by OB/GYN. No recent periods of immobility or long travel.  No prior history of DVT/PE.  In the ED, patient was afebrile, heart rate in 100s, blood pressure in 120s, breathing on room air Labs with CBC unremarkable, troponin not elevated, potassium 3.4, sodium 133 Ultrasound duplex of left lower extremity showed extensive occlusive thrombus at the left common femoral vein, saphenofemoral junction, great saphenous vein profundus femoral vein, superficial femoral vein, popliteal vein, posterior tibial vein and right gastrocnemius vein.  CT angio chest showed bilateral pulmonary emboli with high clot burden. This includes saddle embolus and thrombus in the right and left pulmonary arteries extending into the lobar branches. Right ventricular to left ventricular ratio currently within normal limits.  Patient was started on IV heparin   drip Hospitalist service was consulted for inpatient management  Subjective: Patient was seen and examined this morning.   Sitting up in recliner.  Not in distress. Walked with nurses yesterday and today.  Did not require oxygen. Yesterday she felt that her left leg swelling was getting bigger but feels improvement after keeping it elevated today. Tolerating heparin  drip.  Switched to Eliquis .  Hospital course: Acute pulmonary embolism Acute DVT Presenting with left calf swelling, shortness of breath Imagings showed extensive left LE DVT and high clot burden PE Only risk factor seems to be HRT that was started 2 weeks prior.  No prior history of DVT/PE Seen by pulmonary.  No indication of thrombolysis. Currently on heparin  drip Echocardiogram showed EF 60 to 65%, no WMA, right ventricular size and systolic function normal. Given the size of clot burden and symptoms on ambulation, heparin  drip was continued for 48 hours.  Switch to oral Eliquis  today. Continue Eliquis  for at least 6 months.  Outpatient referral given per hematology evaluation Left leg swelling will take several days to improve.  Recommend to keep leg elevated  Hypokalemia potassium level improved with replacement. 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  Local Benadryl   ointment ordered.  Mobility: Independently able to ambulate.  Did not require supplemental oxygen on walking today.  Goals of care    Code Status: Full Code   Diet:  Diet Order             Diet general           Diet Heart Room service appropriate? Yes; Fluid consistency: Thin  Diet effective now                   Nutritional status:  Body mass index is 34.5 kg/m.       Wounds:  -    Discharge Exam:   Vitals:   05/21/24 2348 05/22/24 0450 05/22/24 0727 05/22/24 1200  BP: 118/70 105/72 117/74 (P) 116/81  Pulse: 77 70 71 (P) 74  Resp: 16 18 16  (P) 17  Temp: 98.2 F (36.8 C) 97.9 F (36.6 C) 98.5 F (36.9 C) (P) 98 F (36.7 C)  TempSrc: Oral Oral Oral (P) Oral  SpO2: 96%  98%   Weight:      Height:        Body mass index is 34.5 kg/m.  General exam: Pleasant, middle-aged Caucasian female.  Not in distress currently Skin: No rashes, lesions or ulcers. HEENT: Atraumatic, normocephalic, no obvious bleeding Lungs: Clear to auscultation bilaterally,  CVS: S1, S2, no murmur,   GI/Abd: Soft, nontender, nondistended, bowel sound present,   CNS: Alert, awake, oriented x 3 Psychiatry: Mood appropriate Extremities: Left lower extremity edema and mild tenderness persists.  Follow ups:    Follow-up Information     Lucius Krabbe, NP Follow up.   Specialty: Family Medicine Contact information: [address] [place] [address] [phone]         Lucius Krabbe, NP Follow up.   Specialty: Family Medicine Contact information: [address] [place] [address] [phone]         Federico Norleen ONEIDA MADISON, MD Follow up.   Specialty: Hematology and Oncology Contact information: [address] [place] [address] [phone]                 Discharge Instructions:   Discharge Instructions     Ambulatory referral to Hematology / Oncology   Complete by: As directed    New DVT/PE   Call MD for:  difficulty breathing, headache or visual disturbances   Complete by: As directed    Call MD for:  extreme fatigue   Complete by: As directed     Call MD for:  hives   Complete by: As directed    Call MD for:  persistant dizziness or light-headedness   Complete by: As directed    Call MD for:  persistant nausea and vomiting   Complete by: As directed    Call MD for:  severe uncontrolled pain   Complete by: As directed    Call MD for:  temperature >100.4   Complete by: As directed    Diet general   Complete by: As directed    Discharge instructions   Complete by: As directed    Recommendations at discharge:   Continue Eliquis  for at least 6 months - 10 mg twice daily for 7 days to be followed by 5 mg twice daily.    Outpatient referral given per hematology evaluation  Left leg swelling will take several days to improve.  Recommend to keep leg elevated.   You have been started on a blood thinner.  Please  watch out for any obvious bleeding, black stool or easy bruisability.  Please contact your primary care provider for further recommendation.    General discharge instructions: Follow with Primary MD Lucius Krabbe, NP in 7 days  Please request your PCP  to go over your hospital tests, procedures, radiology results at the follow up. Please get your medicines reviewed and adjusted.  Your PCP may decide to repeat certain labs or tests as needed. Do not drive, operate heavy machinery, perform activities at heights, swimming or participation in water  activities or provide baby sitting services if your were admitted for syncope or siezures until you have seen by Primary MD or a Neurologist and advised to do so again. [place]  Controlled Substance Reporting System database was reviewed. Do not drive, operate heavy machinery, perform activities at heights, swim, participate in water  activities or provide baby-sitting services while on medications for pain, sleep and mood until your outpatient physician has reevaluated you and advised to do so again.  You are strongly recommended to comply with the dose, frequency and duration of  prescribed medications. Activity: As tolerated with Full fall precautions use walker/cane & assistance as needed Avoid using any recreational substances like cigarette, tobacco, alcohol , or non-prescribed drug. If you experience worsening of your admission symptoms, develop shortness of breath, life threatening emergency, suicidal or homicidal thoughts you must seek medical attention immediately by calling 911 or calling your MD immediately  if symptoms less severe. You must read complete instructions/literature along with all the possible adverse reactions/side effects for all the medicines you take and that have been prescribed to you. Take any new medicine only after you have completely understood and accepted all the possible adverse reactions/side effects.  Wear Seat belts while driving. You were cared for by a hospitalist during your hospital stay. If you have any questions about your discharge medications or the care you received while you were in the hospital after you are discharged, you can call the unit and ask to speak with the hospitalist or the covering physician. Once you are discharged, your primary care physician will handle any further medical issues. Please note that NO REFILLS for any discharge medications will be authorized once you are discharged, as it is imperative that you return to your primary care physician (or establish a relationship with a primary care physician if you do not have one).   Increase activity slowly   Complete by: As directed        Discharge Medications:   Allergies as of 05/22/2024       Reactions   Augmentin  [amoxicillin -pot Clavulanate] Diarrhea   Bupropion Other (See Comments)   Speech sx- stuttering   Codeine    Patient stated possible due to mother and grandparents being allergic as well. Per medical history form dated 01/18/11. Prefers not to have.        Medication List     STOP taking these medications    estradiol  0.0375  MG/24HR Commonly known as: VIVELLE -DOT       TAKE these medications    ALPRAZolam  0.25 MG tablet Commonly known as: XANAX  Take 1 tablet (0.25 mg total) by mouth daily as needed for anxiety.   Azelastine  HCl 137 MCG/SPRAY Soln PLACE 2 SPRAYS INTO BOTH NOSTRILS 2 (TWO) TIMES DAILY. USE IN EACH NOSTRIL AS DIRECTED   cetirizine 10 MG tablet Commonly known as: ZYRTEC Take 10 mg by mouth daily.   diltiazem  120 MG 24 hr capsule Commonly known as: Dilt-XR Take 1  capsule (120 mg total) by mouth daily.   diphenhydrAMINE -zinc  acetate cream Commonly known as: BENADRYL  Apply topically 3 (three) times daily as needed for itching.   Eliquis  DVT/PE Starter Pack Generic drug: Apixaban  Starter Pack (10mg  and 5mg ) Take as directed on package: start with two-5mg  tablets twice daily for 7 days. On day 8, switch to one-5mg  tablet twice daily.   escitalopram  10 MG tablet Commonly known as: LEXAPRO  Take 1 tablet (10 mg total) by mouth daily.   fluticasone  50 MCG/ACT nasal spray Commonly known as: FLONASE  SPRAY 2 SPRAYS INTO EACH NOSTRIL EVERY DAY What changed: See the new instructions.   levothyroxine  112 MCG tablet Commonly known as: SYNTHROID  Take 112 mcg by mouth daily before breakfast.   MULTIVITAMIN ADULT PO Take 1 tablet by mouth daily.   omeprazole  20 MG capsule Commonly known as: PRILOSEC Take 1 capsule (20 mg total) by mouth daily.         The results of significant diagnostics from this hospitalization (including imaging, microbiology, ancillary and laboratory) are listed below for reference.    Procedures and Diagnostic Studies:   ECHOCARDIOGRAM COMPLETE Result Date: 05/20/2024    ECHOCARDIOGRAM REPORT   Patient Name:   Alyssa Fisher Date of Exam: 05/20/2024 Medical Rec #:  [id number]    Height:       65.0 in Accession #:    [id number]   Weight:       207.3 lb Date of Birth:  1966/08/25     BSA:          2.009 m Patient Age:    58 years     BP:           104/85 mmHg  Patient Gender: F            HR:           88 bpm. Exam Location:  Inpatient Procedure: 2D Echo, Cardiac Doppler and Color Doppler (Both Spectral and Color            Flow Doppler were utilized during procedure). Indications:    I26.02 Pulmonary embolus  History:        Patient has no prior history of Echocardiogram examinations.                 Mitral Valve Prolapse, Signs/Symptoms:Shortness of Breath,                 Dyspnea and Edema; Risk Factors:Hypertension and Dyslipidemia.  Sonographer:    Ellouise Mose RDCS Referring Phys: [id number] MARSA NOVAK [facility]  Sonographer Comments: Technically difficult study due to poor echo windows. IMPRESSIONS  1. Prominant false tendon present in apex.. Left ventricular ejection fraction, by estimation, is 60 to 65%. Left ventricular ejection fraction by 2D MOD biplane is 64.3 %. Left ventricular ejection fraction by PLAX is 53 %. The left ventricle has normal function. The left ventricle has no regional wall motion abnormalities. Left ventricular diastolic parameters were normal.  2. Right ventricular systolic function is normal. The right ventricular size is normal.  3. Left atrial size was mildly dilated.  4. The mitral valve is normal in structure. Mild to moderate mitral valve regurgitation. No evidence of mitral stenosis. There is mild prolapse of of the mitral valve.  5. The aortic valve is tricuspid. Aortic valve regurgitation is not visualized. No aortic stenosis is present.  6. Aortic dilatation noted. There is dilatation of the ascending aorta, measuring 39 mm. FINDINGS  Left Ventricle: Prominant false tendon present  in apex. Left ventricular ejection fraction, by estimation, is 60 to 65%. Left ventricular ejection fraction by PLAX is 53 %. Left ventricular ejection fraction by 2D MOD biplane is 64.3 %. The left ventricle has normal function. The left ventricle has no regional wall motion abnormalities. The left ventricular internal cavity size was normal in size. There is  no left ventricular hypertrophy. Left ventricular diastolic parameters were normal. Right Ventricle: The right ventricular size is normal. No increase in right ventricular wall thickness. Right ventricular systolic function is normal. Left Atrium: Left atrial size was mildly dilated. Right Atrium: Right atrial size was normal in size. Pericardium: There is no evidence of pericardial effusion. Mitral Valve: The mitral valve is normal in structure. There is mild prolapse of of the mitral valve. Mild to moderate mitral valve regurgitation. No evidence of mitral valve stenosis. Tricuspid Valve: The tricuspid valve is normal in structure. Tricuspid valve regurgitation is not demonstrated. No evidence of tricuspid stenosis. Aortic Valve: The aortic valve is tricuspid. Aortic valve regurgitation is not visualized. No aortic stenosis is present. Pulmonic Valve: The pulmonic valve was normal in structure. Pulmonic valve regurgitation is not visualized. No evidence of pulmonic stenosis. Aorta: Aortic dilatation noted. There is dilatation of the ascending aorta, measuring 39 mm. IAS/Shunts: No atrial level shunt detected by color flow Doppler.  LEFT VENTRICLE PLAX 2D                        Biplane EF (MOD) LV EF:         Left            LV Biplane EF:   Left                ventricular                      ventricular                ejection                         ejection                fraction by                      fraction by                PLAX is 53                       2D MOD                %.                               biplane is LVIDd:         4.80 cm                          64.3 %. LVIDs:         3.50 cm LV PW:         1.10 cm         Diastology LV IVS:        1.20 cm         LV e' medial:    7.18 cm/s LVOT diam:     2.30  cm         LV E/e' medial:  9.7 LV SV:         79              LV e' lateral:   8.92 cm/s LV SV Index:   39              LV E/e' lateral: 7.8 LVOT Area:     4.15 cm  LV Volumes (MOD) LV vol  d, MOD    65.1 ml A2C: LV vol d, MOD    70.5 ml A4C: LV vol s, MOD    20.8 ml A2C: LV vol s, MOD    26.6 ml A4C: LV SV MOD A2C:   44.3 ml LV SV MOD A4C:   70.5 ml LV SV MOD BP:    43.6 ml RIGHT VENTRICLE             IVC RV S prime:     14.40 cm/s  IVC diam: 1.80 cm TAPSE (M-mode): 1.5 cm LEFT ATRIUM             Index        RIGHT ATRIUM          Index LA diam:        4.40 cm 2.19 cm/m   RA Area:     5.72 cm LA Vol (A2C):   29.7 ml 14.79 ml/m  RA Volume:   6.11 ml  3.04 ml/m LA Vol (A4C):   43.4 ml 21.61 ml/m LA Biplane Vol: 36.3 ml 18.07 ml/m  AORTIC VALVE LVOT Vmax:   103.00 cm/s LVOT Vmean:  69.200 cm/s LVOT VTI:    0.189 m  AORTA Ao Root diam: 3.40 cm Ao Asc diam:  3.90 cm MITRAL VALVE MV Area (PHT): 3.60 cm     SHUNTS MV Decel Time: 211 msec     Systemic VTI:  0.19 m MV E velocity: 69.80 cm/s   Systemic Diam: 2.30 cm MV A velocity: 116.00 cm/s MV E/A ratio:  0.60 Aditya Sabharwal Electronically signed by Ria Commander Signature Date/Time: 05/20/2024/4:01:02 PM    Final    CT Angio Chest PE W and/or Wo Contrast Addendum Date: 05/19/2024 ADDENDUM REPORT: 05/19/2024 17:43 ADDENDUM: The original report was by Dr. Ryan Salvage. The following addendum is by Dr. Ryan Salvage: Critical Value/emergent results were called by telephone at the time of interpretation on 05/19/2024 at 5:39 pm to provider Dr. Freddi, who verbally acknowledged these results. Electronically Signed   By: Ryan Salvage M.D.   On: 05/19/2024 17:43   Result Date: 05/19/2024 CLINICAL DATA:  Shortness of breath.  Left leg swelling. EXAM: CT ANGIOGRAPHY CHEST WITH CONTRAST TECHNIQUE: Multidetector CT imaging of the chest was performed using the standard protocol during bolus administration of intravenous contrast. Multiplanar CT image reconstructions and MIPs were obtained to evaluate the vascular anatomy. RADIATION DOSE REDUCTION: This exam was performed according to the departmental dose-optimization program which  includes automated exposure control, adjustment of the mA and/or kV according to patient size and/or use of iterative reconstruction technique. CONTRAST:  75mL OMNIPAQUE  IOHEXOL  350 MG/ML SOLN COMPARISON:  None Available. FINDINGS: Cardiovascular: Left pulmonary artery embolus extends into the upper and lower lobe branches and segmental branches. Right pulmonary artery embolus extends in upper, middle, lower lobe branches. Small band of saddle embolus extends between the right and left pulmonary arteries. Clot burden is high. Right ventricular to left ventricular ratio 0.76, not elevated. Mild cardiomegaly. Mediastinum/Nodes: Unremarkable  Lungs/Pleura: Mild atelectasis in the posterior basal segment right lower lobe. Upper Abdomen: Unremarkable Musculoskeletal: Unremarkable Review of the MIP images confirms the above findings. IMPRESSION: 1. Bilateral pulmonary emboli with high clot burden. This includes saddle embolus and thrombus in the right and left pulmonary arteries extending into the lobar branches. Right ventricular to left ventricular ratio currently within normal limits. 2. Mild cardiomegaly. 3. Mild atelectasis in the posterior basal segment right lower lobe. Radiology assistant personnel have been notified to put me in telephone contact with the referring physician or the referring physician's clinical representative in order to discuss these findings. Once this communication is established I will issue an addendum to this report for documentation purposes. Electronically Signed: By: Ryan Salvage M.D. On: 05/19/2024 17:21   US  Venous Img Lower Unilateral Left Result Date: 05/19/2024 CLINICAL DATA:  Shortness of breath, left calf and leg pain and edema for 3 days EXAM: LEFT LOWER EXTREMITY VENOUS DOPPLER ULTRASOUND TECHNIQUE: Gray-scale sonography with compression, as well as color and duplex ultrasound, were performed to evaluate the deep venous system(s) from the level of the common femoral  vein through the popliteal and proximal calf veins. COMPARISON:  None Available. FINDINGS: VENOUS Occlusive thrombus at the left common femoral vein, saphenofemoral junction, profundus femoral vein, superficial femoral vein, and popliteal vein. Thrombus extends into the posterior tibial vein and right gastrocnemius vein. The left peroneal vein was not well visualized. Thrombus is present in the great saphenous vein in the thigh. Limited views of the contralateral common femoral vein are unremarkable. OTHER None. Limitations: none IMPRESSION: 1. Extensive occlusive thrombus in the left lower extremity compatible with DVT. This result will be discussed with the referring provider along with the findings of positive pulmonary embolus from the chest CT angiogram with communication documented on the chest CT angiogram report. Electronically Signed   By: Ryan Salvage M.D.   On: 05/19/2024 17:24     Labs:   Basic Metabolic Panel: Recent Labs  Lab 05/19/24 1451 05/20/24 0912 05/21/24 0536  NA 133* 136 138  K 3.4* 3.5 4.1  CL 98 102 99  CO2 18* 22 22  GLUCOSE 127* 108* 101*  BUN 11 9 9   CREATININE 0.89 0.85 1.00  CALCIUM  9.3 8.8* 8.8*   GFR Estimated Creatinine Clearance: 68.7 mL/min (by C-G formula based on SCr of 1 mg/dL). Liver Function Tests: Recent Labs  Lab 05/19/24 1451  AST 17  ALT 7  ALKPHOS 101  BILITOT 0.5  PROT 7.4  ALBUMIN 3.8   No results for input(s): LIPASE, AMYLASE in the last 168 hours. No results for input(s): AMMONIA in the last 168 hours. Coagulation profile Recent Labs  Lab 05/19/24 1557  INR 1.0    CBC: Recent Labs  Lab 05/19/24 1451 05/20/24 0912 05/21/24 0536 05/22/24 0447  WBC 8.9 8.3 7.2 7.9  NEUTROABS 5.9  --   --   --   HGB 12.0 12.1 11.2* 10.5*  HCT 36.4 36.4 35.3* 32.5*  MCV 86.7 86.5 90.7 89.8  PLT 201 209 202 217   Cardiac Enzymes: No results for input(s): CKTOTAL, CKMB, CKMBINDEX, TROPONINI in the last 168  hours. BNP: Invalid input(s): POCBNP CBG: Recent Labs  Lab 05/21/24 1201 05/21/24 1536 05/21/24 2121 05/22/24 0724 05/22/24 1213  GLUCAP 101* 100* 119* 105* 103*   D-Dimer No results for input(s): DDIMER in the last 72 hours. Hgb A1c No results for input(s): HGBA1C in the last 72 hours. Lipid Profile No results for input(s): CHOL, HDL,  LDLCALC, TRIG, CHOLHDL, LDLDIRECT in the last 72 hours. Thyroid  function studies No results for input(s): TSH, T4TOTAL, T3FREE, THYROIDAB in the last 72 hours.  Invalid input(s): FREET3 Anemia work up No results for input(s): VITAMINB12, FOLATE, FERRITIN, TIBC, IRON, RETICCTPCT in the last 72 hours. Microbiology No results found for this or any previous visit (from the past 240 hours).  Time coordinating discharge: 45 minutes  Signed: Candler Ginsberg  [facility] 05/22/2024, 6:06 PM

## 2024-05-22 NOTE — Progress Notes (Signed)
 Physician Discharge Summary  ROSITA Fisher FMW:[id number] DOB: [birth year] DOA: 05/19/2024  PCP: Lucius Krabbe, NP  Admit date: 05/19/2024 Discharge date: 05/22/2024  Admitted from: Home Discharge disposition: Home  Recommendations at discharge:  Continue Eliquis  for at least 6 months - 10 mg twice daily for 7 days to be followed by 5 mg twice daily.   Outpatient referral given per hematology evaluation Left leg swelling will take several days to improve.  Recommend to keep leg elevated.   Brief narrative: Alyssa Fisher is a 58 y.o. female with PMH significant for obesity, prediabetes, HTN, HLD, hypothyroidism, GERD, mitral valve prolapse, anxiety, insomnia, arthritis 9/15, patient presented to ED with complaint of left leg pain and swelling for 4 days, associated with increased work of breathing when taking the stairs. Patient reports that though her left calf has always been larger than her right she noticed a change approximately 6 weeks ago after she underwent a septoplasty and turbinate reduction. 2 weeks ago, she was started on HRT with estrogen patch by OB/GYN. No recent periods of immobility or long travel.  No prior history of DVT/PE.  In the ED, patient was afebrile, heart rate in 100s, blood pressure in 120s, breathing on room air Labs with CBC unremarkable, troponin not elevated, potassium 3.4, sodium 133 Ultrasound duplex of left lower extremity showed extensive occlusive thrombus at the left common femoral vein, saphenofemoral junction, great saphenous vein profundus femoral vein, superficial femoral vein, popliteal vein, posterior tibial vein and right gastrocnemius vein.  CT angio chest showed bilateral pulmonary emboli with high clot burden. This includes saddle embolus and thrombus in the right and left pulmonary arteries extending into the lobar branches. Right ventricular to left ventricular ratio currently within normal limits.  Patient was started on IV heparin   drip Hospitalist service was consulted for inpatient management  Subjective: Patient was seen and examined this morning.   Sitting up in recliner.  Not in distress. Walked with nurses yesterday and today.  Did not require oxygen. Yesterday she felt that her left leg swelling was getting bigger but feels improvement after keeping it elevated today. Tolerating heparin  drip.  Switched to Eliquis .  Hospital course: Acute pulmonary embolism Acute DVT Presenting with left calf swelling, shortness of breath Imagings showed extensive left LE DVT and high clot burden PE Only risk factor seems to be HRT that was started 2 weeks prior.  No prior history of DVT/PE Seen by pulmonary.  No indication of thrombolysis. Currently on heparin  drip Echocardiogram showed EF 60 to 65%, no WMA, right ventricular size and systolic function normal. Given the size of clot burden and symptoms on ambulation, heparin  drip was continued for 48 hours.  Switch to oral Eliquis  today. Continue Eliquis  for at least 6 months.  Outpatient referral given per hematology evaluation Left leg swelling will take several days to improve.  Recommend to keep leg elevated  Hypokalemia potassium level improved with replacement. 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  Local Benadryl   ointment ordered.  Mobility: Independently able to ambulate.  Did not require supplemental oxygen on walking today.  Goals of care    Code Status: Full Code   Diet:  Diet Order             Diet general           Diet Heart Room service appropriate? Yes; Fluid consistency: Thin  Diet effective now                   Nutritional status:  Body mass index is 34.5 kg/m.       Wounds:  -    Discharge Exam:   Vitals:   05/21/24 2348 05/22/24 0450 05/22/24 0727 05/22/24 1200  BP: 118/70 105/72 117/74 (P) 116/81  Pulse: 77 70 71 (P) 74  Resp: 16 18 16  (P) 17  Temp: 98.2 F (36.8 C) 97.9 F (36.6 C) 98.5 F (36.9 C) (P) 98 F (36.7 C)  TempSrc: Oral Oral Oral (P) Oral  SpO2: 96%  98%   Weight:      Height:        Body mass index is 34.5 kg/m.  General exam: Pleasant, middle-aged Caucasian female.  Not in distress currently Skin: No rashes, lesions or ulcers. HEENT: Atraumatic, normocephalic, no obvious bleeding Lungs: Clear to auscultation bilaterally,  CVS: S1, S2, no murmur,   GI/Abd: Soft, nontender, nondistended, bowel sound present,   CNS: Alert, awake, oriented x 3 Psychiatry: Mood appropriate Extremities: Left lower extremity edema and mild tenderness persists.  Follow ups:    Follow-up Information     Lucius Krabbe, NP Follow up.   Specialty: Family Medicine Contact information: [address] [place] [address] [phone]         Lucius Krabbe, NP Follow up.   Specialty: Family Medicine Contact information: [address] [place] [address] [phone]         Federico Norleen ONEIDA MADISON, MD Follow up.   Specialty: Hematology and Oncology Contact information: [address] [place] [address] [phone]                 Discharge Instructions:   Discharge Instructions     Ambulatory referral to Hematology / Oncology   Complete by: As directed    New DVT/PE   Call MD for:  difficulty breathing, headache or visual disturbances   Complete by: As directed    Call MD for:  extreme fatigue   Complete by: As directed     Call MD for:  hives   Complete by: As directed    Call MD for:  persistant dizziness or light-headedness   Complete by: As directed    Call MD for:  persistant nausea and vomiting   Complete by: As directed    Call MD for:  severe uncontrolled pain   Complete by: As directed    Call MD for:  temperature >100.4   Complete by: As directed    Diet general   Complete by: As directed    Discharge instructions   Complete by: As directed    Recommendations at discharge:   Continue Eliquis  for at least 6 months - 10 mg twice daily for 7 days to be followed by 5 mg twice daily.    Outpatient referral given per hematology evaluation  Left leg swelling will take several days to improve.  Recommend to keep leg elevated.   You have been started on a blood thinner.  Please  watch out for any obvious bleeding, black stool or easy bruisability.  Please contact your primary care provider for further recommendation.    General discharge instructions: Follow with Primary MD Lucius Krabbe, NP in 7 days  Please request your PCP  to go over your hospital tests, procedures, radiology results at the follow up. Please get your medicines reviewed and adjusted.  Your PCP may decide to repeat certain labs or tests as needed. Do not drive, operate heavy machinery, perform activities at heights, swimming or participation in water  activities or provide baby sitting services if your were admitted for syncope or siezures until you have seen by Primary MD or a Neurologist and advised to do so again. [place]  Controlled Substance Reporting System database was reviewed. Do not drive, operate heavy machinery, perform activities at heights, swim, participate in water  activities or provide baby-sitting services while on medications for pain, sleep and mood until your outpatient physician has reevaluated you and advised to do so again.  You are strongly recommended to comply with the dose, frequency and duration of  prescribed medications. Activity: As tolerated with Full fall precautions use walker/cane & assistance as needed Avoid using any recreational substances like cigarette, tobacco, alcohol , or non-prescribed drug. If you experience worsening of your admission symptoms, develop shortness of breath, life threatening emergency, suicidal or homicidal thoughts you must seek medical attention immediately by calling 911 or calling your MD immediately  if symptoms less severe. You must read complete instructions/literature along with all the possible adverse reactions/side effects for all the medicines you take and that have been prescribed to you. Take any new medicine only after you have completely understood and accepted all the possible adverse reactions/side effects.  Wear Seat belts while driving. You were cared for by a hospitalist during your hospital stay. If you have any questions about your discharge medications or the care you received while you were in the hospital after you are discharged, you can call the unit and ask to speak with the hospitalist or the covering physician. Once you are discharged, your primary care physician will handle any further medical issues. Please note that NO REFILLS for any discharge medications will be authorized once you are discharged, as it is imperative that you return to your primary care physician (or establish a relationship with a primary care physician if you do not have one).   Increase activity slowly   Complete by: As directed        Discharge Medications:   Allergies as of 05/22/2024       Reactions   Augmentin  [amoxicillin -pot Clavulanate] Diarrhea   Bupropion Other (See Comments)   Speech sx- stuttering   Codeine    Patient stated possible due to mother and grandparents being allergic as well. Per medical history form dated 01/18/11. Prefers not to have.        Medication List     STOP taking these medications    estradiol  0.0375  MG/24HR Commonly known as: VIVELLE -DOT       TAKE these medications    ALPRAZolam  0.25 MG tablet Commonly known as: XANAX  Take 1 tablet (0.25 mg total) by mouth daily as needed for anxiety.   Apixaban  Starter Pack (10mg  and 5mg ) Commonly known as: ELIQUIS  STARTER PACK Take as directed on package: start with two-5mg  tablets twice daily for 7 days. On day 8, switch to one-5mg  tablet twice daily.   Azelastine  HCl 137 MCG/SPRAY Soln PLACE 2 SPRAYS INTO BOTH NOSTRILS 2 (TWO) TIMES  DAILY. USE IN EACH NOSTRIL AS DIRECTED   cetirizine 10 MG tablet Commonly known as: ZYRTEC Take 10 mg by mouth daily.   diltiazem  120 MG 24 hr capsule Commonly known as: Dilt-XR Take 1 capsule (120 mg total) by mouth daily.   diphenhydrAMINE -zinc  acetate cream Commonly known as: BENADRYL  Apply topically 3 (three) times daily as needed for itching.   escitalopram  10 MG tablet Commonly known as: LEXAPRO  Take 1 tablet (10 mg total) by mouth daily.   fluticasone  50 MCG/ACT nasal spray Commonly known as: FLONASE  SPRAY 2 SPRAYS INTO EACH NOSTRIL EVERY DAY What changed: See the new instructions.   levothyroxine  112 MCG tablet Commonly known as: SYNTHROID  Take 112 mcg by mouth daily before breakfast.   MULTIVITAMIN ADULT PO Take 1 tablet by mouth daily.   omeprazole  20 MG capsule Commonly known as: PRILOSEC Take 1 capsule (20 mg total) by mouth daily.         The results of significant diagnostics from this hospitalization (including imaging, microbiology, ancillary and laboratory) are listed below for reference.    Procedures and Diagnostic Studies:   ECHOCARDIOGRAM COMPLETE Result Date: 05/20/2024    ECHOCARDIOGRAM REPORT   Patient Name:   KENZLIE DISCH Date of Exam: 05/20/2024 Medical Rec #:  [id number]    Height:       65.0 in Accession #:    [id number]   Weight:       207.3 lb Date of Birth:  July 25, 1966     BSA:          2.009 m Patient Age:    58 years     BP:           104/85 mmHg Patient  Gender: F            HR:           88 bpm. Exam Location:  Inpatient Procedure: 2D Echo, Cardiac Doppler and Color Doppler (Both Spectral and Color            Flow Doppler were utilized during procedure). Indications:    I26.02 Pulmonary embolus  History:        Patient has no prior history of Echocardiogram examinations.                 Mitral Valve Prolapse, Signs/Symptoms:Shortness of Breath,                 Dyspnea and Edema; Risk Factors:Hypertension and Dyslipidemia.  Sonographer:    Ellouise Mose RDCS Referring Phys: [id number] MARSA NOVAK [facility]  Sonographer Comments: Technically difficult study due to poor echo windows. IMPRESSIONS  1. Prominant false tendon present in apex.. Left ventricular ejection fraction, by estimation, is 60 to 65%. Left ventricular ejection fraction by 2D MOD biplane is 64.3 %. Left ventricular ejection fraction by PLAX is 53 %. The left ventricle has normal function. The left ventricle has no regional wall motion abnormalities. Left ventricular diastolic parameters were normal.  2. Right ventricular systolic function is normal. The right ventricular size is normal.  3. Left atrial size was mildly dilated.  4. The mitral valve is normal in structure. Mild to moderate mitral valve regurgitation. No evidence of mitral stenosis. There is mild prolapse of of the mitral valve.  5. The aortic valve is tricuspid. Aortic valve regurgitation is not visualized. No aortic stenosis is present.  6. Aortic dilatation noted. There is dilatation of the ascending aorta, measuring 39 mm. FINDINGS  Left Ventricle: Prominant false tendon present  in apex. Left ventricular ejection fraction, by estimation, is 60 to 65%. Left ventricular ejection fraction by PLAX is 53 %. Left ventricular ejection fraction by 2D MOD biplane is 64.3 %. The left ventricle has normal function. The left ventricle has no regional wall motion abnormalities. The left ventricular internal cavity size was normal in size. There is no left  ventricular hypertrophy. Left ventricular diastolic parameters were normal. Right Ventricle: The right ventricular size is normal. No increase in right ventricular wall thickness. Right ventricular systolic function is normal. Left Atrium: Left atrial size was mildly dilated. Right Atrium: Right atrial size was normal in size. Pericardium: There is no evidence of pericardial effusion. Mitral Valve: The mitral valve is normal in structure. There is mild prolapse of of the mitral valve. Mild to moderate mitral valve regurgitation. No evidence of mitral valve stenosis. Tricuspid Valve: The tricuspid valve is normal in structure. Tricuspid valve regurgitation is not demonstrated. No evidence of tricuspid stenosis. Aortic Valve: The aortic valve is tricuspid. Aortic valve regurgitation is not visualized. No aortic stenosis is present. Pulmonic Valve: The pulmonic valve was normal in structure. Pulmonic valve regurgitation is not visualized. No evidence of pulmonic stenosis. Aorta: Aortic dilatation noted. There is dilatation of the ascending aorta, measuring 39 mm. IAS/Shunts: No atrial level shunt detected by color flow Doppler.  LEFT VENTRICLE PLAX 2D                        Biplane EF (MOD) LV EF:         Left            LV Biplane EF:   Left                ventricular                      ventricular                ejection                         ejection                fraction by                      fraction by                PLAX is 53                       2D MOD                %.                               biplane is LVIDd:         4.80 cm                          64.3 %. LVIDs:         3.50 cm LV PW:         1.10 cm         Diastology LV IVS:        1.20 cm         LV e' medial:    7.18 cm/s LVOT diam:     2.30  cm         LV E/e' medial:  9.7 LV SV:         79              LV e' lateral:   8.92 cm/s LV SV Index:   39              LV E/e' lateral: 7.8 LVOT Area:     4.15 cm  LV Volumes (MOD) LV vol d, MOD     65.1 ml A2C: LV vol d, MOD    70.5 ml A4C: LV vol s, MOD    20.8 ml A2C: LV vol s, MOD    26.6 ml A4C: LV SV MOD A2C:   44.3 ml LV SV MOD A4C:   70.5 ml LV SV MOD BP:    43.6 ml RIGHT VENTRICLE             IVC RV S prime:     14.40 cm/s  IVC diam: 1.80 cm TAPSE (M-mode): 1.5 cm LEFT ATRIUM             Index        RIGHT ATRIUM          Index LA diam:        4.40 cm 2.19 cm/m   RA Area:     5.72 cm LA Vol (A2C):   29.7 ml 14.79 ml/m  RA Volume:   6.11 ml  3.04 ml/m LA Vol (A4C):   43.4 ml 21.61 ml/m LA Biplane Vol: 36.3 ml 18.07 ml/m  AORTIC VALVE LVOT Vmax:   103.00 cm/s LVOT Vmean:  69.200 cm/s LVOT VTI:    0.189 m  AORTA Ao Root diam: 3.40 cm Ao Asc diam:  3.90 cm MITRAL VALVE MV Area (PHT): 3.60 cm     SHUNTS MV Decel Time: 211 msec     Systemic VTI:  0.19 m MV E velocity: 69.80 cm/s   Systemic Diam: 2.30 cm MV A velocity: 116.00 cm/s MV E/A ratio:  0.60 Aditya Sabharwal Electronically signed by Ria Commander Signature Date/Time: 05/20/2024/4:01:02 PM    Final    CT Angio Chest PE W and/or Wo Contrast Addendum Date: 05/19/2024 ADDENDUM REPORT: 05/19/2024 17:43 ADDENDUM: The original report was by Dr. Ryan Salvage. The following addendum is by Dr. Ryan Salvage: Critical Value/emergent results were called by telephone at the time of interpretation on 05/19/2024 at 5:39 pm to provider Dr. Freddi, who verbally acknowledged these results. Electronically Signed   By: Ryan Salvage M.D.   On: 05/19/2024 17:43   Result Date: 05/19/2024 CLINICAL DATA:  Shortness of breath.  Left leg swelling. EXAM: CT ANGIOGRAPHY CHEST WITH CONTRAST TECHNIQUE: Multidetector CT imaging of the chest was performed using the standard protocol during bolus administration of intravenous contrast. Multiplanar CT image reconstructions and MIPs were obtained to evaluate the vascular anatomy. RADIATION DOSE REDUCTION: This exam was performed according to the departmental dose-optimization program which includes  automated exposure control, adjustment of the mA and/or kV according to patient size and/or use of iterative reconstruction technique. CONTRAST:  75mL OMNIPAQUE  IOHEXOL  350 MG/ML SOLN COMPARISON:  None Available. FINDINGS: Cardiovascular: Left pulmonary artery embolus extends into the upper and lower lobe branches and segmental branches. Right pulmonary artery embolus extends in upper, middle, lower lobe branches. Small band of saddle embolus extends between the right and left pulmonary arteries. Clot burden is high. Right ventricular to left ventricular ratio 0.76, not elevated. Mild cardiomegaly. Mediastinum/Nodes: Unremarkable  Lungs/Pleura: Mild atelectasis in the posterior basal segment right lower lobe. Upper Abdomen: Unremarkable Musculoskeletal: Unremarkable Review of the MIP images confirms the above findings. IMPRESSION: 1. Bilateral pulmonary emboli with high clot burden. This includes saddle embolus and thrombus in the right and left pulmonary arteries extending into the lobar branches. Right ventricular to left ventricular ratio currently within normal limits. 2. Mild cardiomegaly. 3. Mild atelectasis in the posterior basal segment right lower lobe. Radiology assistant personnel have been notified to put me in telephone contact with the referring physician or the referring physician's clinical representative in order to discuss these findings. Once this communication is established I will issue an addendum to this report for documentation purposes. Electronically Signed: By: Ryan Salvage M.D. On: 05/19/2024 17:21   US  Venous Img Lower Unilateral Left Result Date: 05/19/2024 CLINICAL DATA:  Shortness of breath, left calf and leg pain and edema for 3 days EXAM: LEFT LOWER EXTREMITY VENOUS DOPPLER ULTRASOUND TECHNIQUE: Gray-scale sonography with compression, as well as color and duplex ultrasound, were performed to evaluate the deep venous system(s) from the level of the common femoral vein through  the popliteal and proximal calf veins. COMPARISON:  None Available. FINDINGS: VENOUS Occlusive thrombus at the left common femoral vein, saphenofemoral junction, profundus femoral vein, superficial femoral vein, and popliteal vein. Thrombus extends into the posterior tibial vein and right gastrocnemius vein. The left peroneal vein was not well visualized. Thrombus is present in the great saphenous vein in the thigh. Limited views of the contralateral common femoral vein are unremarkable. OTHER None. Limitations: none IMPRESSION: 1. Extensive occlusive thrombus in the left lower extremity compatible with DVT. This result will be discussed with the referring provider along with the findings of positive pulmonary embolus from the chest CT angiogram with communication documented on the chest CT angiogram report. Electronically Signed   By: Ryan Salvage M.D.   On: 05/19/2024 17:24     Labs:   Basic Metabolic Panel: Recent Labs  Lab 05/19/24 1451 05/20/24 0912 05/21/24 0536  NA 133* 136 138  K 3.4* 3.5 4.1  CL 98 102 99  CO2 18* 22 22  GLUCOSE 127* 108* 101*  BUN 11 9 9   CREATININE 0.89 0.85 1.00  CALCIUM  9.3 8.8* 8.8*   GFR Estimated Creatinine Clearance: 68.7 mL/min (by C-G formula based on SCr of 1 mg/dL). Liver Function Tests: Recent Labs  Lab 05/19/24 1451  AST 17  ALT 7  ALKPHOS 101  BILITOT 0.5  PROT 7.4  ALBUMIN 3.8   No results for input(s): LIPASE, AMYLASE in the last 168 hours. No results for input(s): AMMONIA in the last 168 hours. Coagulation profile Recent Labs  Lab 05/19/24 1557  INR 1.0    CBC: Recent Labs  Lab 05/19/24 1451 05/20/24 0912 05/21/24 0536 05/22/24 0447  WBC 8.9 8.3 7.2 7.9  NEUTROABS 5.9  --   --   --   HGB 12.0 12.1 11.2* 10.5*  HCT 36.4 36.4 35.3* 32.5*  MCV 86.7 86.5 90.7 89.8  PLT 201 209 202 217   Cardiac Enzymes: No results for input(s): CKTOTAL, CKMB, CKMBINDEX, TROPONINI in the last 168 hours. BNP: Invalid  input(s): POCBNP CBG: Recent Labs  Lab 05/21/24 1201 05/21/24 1536 05/21/24 2121 05/22/24 0724 05/22/24 1213  GLUCAP 101* 100* 119* 105* 103*   D-Dimer No results for input(s): DDIMER in the last 72 hours. Hgb A1c No results for input(s): HGBA1C in the last 72 hours. Lipid Profile No results for input(s): CHOL, HDL,  LDLCALC, TRIG, CHOLHDL, LDLDIRECT in the last 72 hours. Thyroid  function studies No results for input(s): TSH, T4TOTAL, T3FREE, THYROIDAB in the last 72 hours.  Invalid input(s): FREET3 Anemia work up No results for input(s): VITAMINB12, FOLATE, FERRITIN, TIBC, IRON, RETICCTPCT in the last 72 hours. Microbiology No results found for this or any previous visit (from the past 240 hours).  Time coordinating discharge: 45 minutes  Signed: Zakar Brosch  [facility] 05/22/2024, 1:10 PM

## 2024-05-22 NOTE — Plan of Care (Signed)
  Problem: Education: Goal: Knowledge of General Education information will improve Description: Including pain rating scale, medication(s)/side effects and non-pharmacologic comfort measures Outcome: Progressing   Problem: Clinical Measurements: Goal: Ability to maintain clinical measurements within normal limits will improve Outcome: Progressing Goal: Will remain free from infection Outcome: Progressing Goal: Diagnostic test results will improve Outcome: Progressing   Problem: Coping: Goal: Level of anxiety will decrease Outcome: Progressing   Problem: Elimination: Goal: Will not experience complications related to bowel motility Outcome: Progressing

## 2024-05-23 ENCOUNTER — Telehealth: Payer: Self-pay

## 2024-05-23 NOTE — Transitions of Care (Post Inpatient/ED Visit) (Signed)
 05/23/2024  Name: Alyssa Fisher MRN: [id number] DOB: 1966-03-06  Today's TOC FU Call Status: Today's TOC FU Call Status:: Successful TOC FU Call Completed TOC FU Call Complete Date: 05/23/24 Patient's Name and Date of Birth confirmed.  Transition Care Management Follow-up Telephone Call Date of Discharge: 05/22/24 Discharge Facility: [name] Type of Discharge: Inpatient Admission Primary Inpatient Discharge Diagnosis:: PE How have you been since you were released from the hospital?: Same Any questions or concerns?: No  Items Reviewed: Did you receive and understand the discharge instructions provided?: Yes Medications obtained,verified, and reconciled?: Yes (Medications Reviewed) Any new allergies since your discharge?: No Dietary orders reviewed?: Yes Do you have support at home?: Yes People in Home [RPT]: parent(s), child(ren), adult  Medications Reviewed Today: Medications Reviewed Today     Reviewed by Alyssa Pan, LPN (Licensed Practical Nurse) on 05/23/24 at 1011  Med List Status: <None>   Medication Order Taking? Sig Documenting Provider Last Dose Status Informant  ALPRAZolam  (XANAX ) 0.25 MG tablet [id number] Yes Take 1 tablet (0.25 mg total) by mouth daily as needed for anxiety. Alyssa Krabbe, NP  Active Self, Pharmacy Records  APIXABAN  (ELIQUIS ) VTE STARTER PACK (10MG  AND 5MG ) [id number] Yes Take as directed on package: start with two-5mg  tablets twice daily for 7 days. On day 8, switch to one-5mg  tablet twice daily. Alyssa Fisher, [place], MD  Active   Azelastine  HCl 137 MCG/SPRAY SOLN [id number]  PLACE 2 SPRAYS INTO BOTH NOSTRILS 2 (TWO) TIMES DAILY. USE IN [facility] NOSTRIL AS DIRECTED  Patient not taking: Reported on 05/23/2024   Alyssa Eldora NOVAK, MD  Active Self, Pharmacy Records  cetirizine (ZYRTEC) 10 MG tablet [id number] Yes Take 10 mg by mouth daily. [provider]  Active Self, Pharmacy Records  diltiazem  (DILT-XR) 120 MG 24 hr capsule [id number] Yes  Take 1 capsule (120 mg total) by mouth daily. Alyssa Krabbe, NP  Active Self, Pharmacy Records  diphenhydrAMINE -zinc  acetate (BENADRYL ) cream [id number] Yes Apply topically 3 (three) times daily as needed for itching. Alyssa Reichert, MD  Active   escitalopram  (LEXAPRO ) 10 MG tablet [id number] Yes Take 1 tablet (10 mg total) by mouth daily. Alyssa Krabbe, NP  Active Self, Pharmacy Records  fluticasone  (FLONASE ) 50 MCG/ACT nasal spray [id number] Yes SPRAY 2 SPRAYS INTO EACH NOSTRIL EVERY DAY  Patient taking differently: Place 2 sprays into both nostrils daily.   Alyssa Eldora NOVAK, MD  Active Self, Pharmacy Records  levothyroxine  (SYNTHROID ) 112 MCG tablet [id number] Yes Take 112 mcg by mouth daily before breakfast. [provider]  Active Self, Pharmacy Records  Multiple Vitamin (MULTIVITAMIN ADULT PO) [id number]  Take 1 tablet by mouth daily.  Patient not taking: Reported on 05/23/2024   [provider]  Active Self, Pharmacy Records  omeprazole  (PRILOSEC) 20 MG capsule [id number] Yes Take 1 capsule (20 mg total) by mouth daily. Alyssa Krabbe, NP  Active Self, Pharmacy Records            Home Care and Equipment/Supplies: Were Home Health Services Ordered?: NA Any new equipment or medical supplies ordered?: NA  Functional Questionnaire: Do you need assistance with bathing/showering or dressing?: No Do you need assistance with meal preparation?: No Do you need assistance with eating?: No Do you have difficulty maintaining continence: No Do you need assistance with getting out of bed/getting out of a chair/moving?: No Do you have difficulty managing or taking your medications?: No  Follow up appointments reviewed: PCP Follow-up appointment confirmed?: Yes Date of PCP follow-up appointment?: 05/29/24 Specialist  Hospital Follow-up appointment confirmed?: No Reason Specialist Follow-Up Not Confirmed: Patient has Specialist Provider Number and will Call for  Appointment Do you need transportation to your follow-up appointment?: No Do you understand care options if your condition(s) worsen?: Yes-patient verbalized understanding    SIGNATURE Alyssa Lemmings, LPN [facility] Nurse Health Advisor Direct Dial [phone]

## 2024-05-26 ENCOUNTER — Ambulatory Visit: Payer: 59

## 2024-05-26 VITALS — Ht 65.0 in | Wt 207.0 lb

## 2024-05-26 DIAGNOSIS — Z0000 Encounter for general adult medical examination without abnormal findings: Secondary | ICD-10-CM | POA: Diagnosis not present

## 2024-05-26 DIAGNOSIS — Z1231 Encounter for screening mammogram for malignant neoplasm of breast: Secondary | ICD-10-CM | POA: Diagnosis not present

## 2024-05-26 NOTE — Patient Instructions (Signed)
 Alyssa Fisher,  Thank you for taking the time for your Medicare Wellness Visit. I appreciate your continued commitment to your health goals. Please review the care plan we discussed, and feel free to reach out if I can assist you further.  Medicare recommends these wellness visits once per year to help you and your care team stay ahead of potential health issues. These visits are designed to focus on prevention, allowing your provider to concentrate on managing your acute and chronic conditions during your regular appointments.  Please note that Annual Wellness Visits do not include a physical exam. Some assessments may be limited, especially if the visit was conducted virtually. If needed, we may recommend a separate in-person follow-up with your provider.  Ongoing Care Seeing your primary care provider every 3 to 6 months helps us  monitor your health and provide consistent, personalized care.   Referrals If a referral was made during today's visit and you haven't received any updates within two weeks, please contact the referred provider directly to check on the status.  Recommended Screenings:  Health Maintenance  Topic Date Due   Breast Cancer Screening  06/22/2024   Hepatitis B Vaccine (1 of 3 - 19+ 3-dose series) 04/15/2025*   Medicare Annual Wellness Visit  05/26/2025   Colon Cancer Screening  04/28/2026   DTaP/Tdap/Td vaccine (4 - Td or Tdap) 02/02/2032   Pneumococcal Vaccine for age over 32  Completed   Flu Shot  Completed   COVID-19 Vaccine  Completed   Hepatitis C Screening  Completed   HIV Screening  Completed   Zoster (Shingles) Vaccine  Completed   HPV Vaccine  Aged Out   Meningitis B Vaccine  Aged Out  *Topic was postponed. The date shown is not the original due date.       05/26/2024    1:54 PM  Advanced Directives  Does Patient Have a Medical Advance Directive? No  Would patient like information on creating a medical advance directive? No - Patient declined    Advance Care Planning is important because it: Ensures you receive medical care that aligns with your values, goals, and preferences. Provides guidance to your family and loved ones, reducing the emotional burden of decision-making during critical moments.  Vision: Annual vision screenings are recommended for early detection of glaucoma, cataracts, and diabetic retinopathy. These exams can also reveal signs of chronic conditions such as diabetes and high blood pressure.  Dental: Annual dental screenings help detect early signs of oral cancer, gum disease, and other conditions linked to overall health, including heart disease and diabetes.  Please see the attached documents for additional preventive care recommendations.

## 2024-05-26 NOTE — Progress Notes (Signed)
 Subjective:   Alyssa Fisher is a 58 y.o. who presents for a Medicare Wellness preventive visit.  As a reminder, Annual Wellness Visits don't include a physical exam, and some assessments may be limited, especially if this visit is performed virtually. We may recommend an in-person follow-up visit with your provider if needed.  Visit Complete: Virtual I connected with  Alyssa Fisher on 05/26/24 by a audio enabled telemedicine application and verified that I am speaking with the correct person using two identifiers.  Patient Location: Home  Provider Location: Office/Clinic  I discussed the limitations of evaluation and management by telemedicine. The patient expressed understanding and agreed to proceed.  Vital Signs: Because this visit was a virtual/telehealth visit, some criteria may be missing or patient reported. Any vitals not documented were not able to be obtained and vitals that have been documented are patient reported.  VideoDeclined- This patient declined Librarian, academic. Therefore the visit was completed with audio only.  Persons Participating in Visit: Patient.  AWV Questionnaire: Yes: Patient Medicare AWV questionnaire was completed by the patient on 05/25/24; I have confirmed that all information answered by patient is correct and no changes since this date.  Cardiac Risk Factors include: advanced age (>63men, >81 women);dyslipidemia;obesity (BMI >30kg/m2)     Objective:    Today's Vitals   05/26/24 1346  Weight: 207 lb (93.9 kg)  Height: 5' 5 (1.651 m)   Body mass index is 34.45 kg/m.     05/26/2024    1:54 PM 05/20/2024   11:00 AM 05/21/2023   11:42 AM 05/19/2022   10:12 AM 12/19/2018    6:36 AM 12/18/2018   12:08 PM 09/19/2017    9:03 AM  Advanced Directives  Does Patient Have a Medical Advance Directive? No No No No No No No   Type of Theme park manager;Living will     Does patient want to make  changes to medical advance directive?    Yes (MAU/Ambulatory/Procedural Areas - Information given)     Would patient like information on creating a medical advance directive? No - Patient declined No - Patient declined No - Patient declined Yes (MAU/Ambulatory/Procedural Areas - Information given) No - Patient declined  No - Patient declined  No - Patient declined      Data saved with a previous flowsheet row definition    Current Medications (verified) Outpatient Encounter Medications as of 05/26/2024  Medication Sig   ALPRAZolam  (XANAX ) 0.25 MG tablet Take 1 tablet (0.25 mg total) by mouth daily as needed for anxiety.   APIXABAN  (ELIQUIS ) VTE STARTER PACK (10MG  AND 5MG ) Take as directed on package: start with two-5mg  tablets twice daily for 7 days. On day 8, switch to one-5mg  tablet twice daily.   cetirizine (ZYRTEC) 10 MG tablet Take 10 mg by mouth daily.   diltiazem  (DILT-XR) 120 MG 24 hr capsule Take 1 capsule (120 mg total) by mouth daily.   diphenhydrAMINE -zinc  acetate (BENADRYL ) cream Apply topically 3 (three) times daily as needed for itching.   escitalopram  (LEXAPRO ) 10 MG tablet Take 1 tablet (10 mg total) by mouth daily.   fluticasone  (FLONASE ) 50 MCG/ACT nasal spray SPRAY 2 SPRAYS INTO EACH NOSTRIL EVERY DAY   levothyroxine  (SYNTHROID ) 112 MCG tablet Take 112 mcg by mouth daily before breakfast.   omeprazole  (PRILOSEC) 20 MG capsule Take 1 capsule (20 mg total) by mouth daily.   Azelastine  HCl 137 MCG/SPRAY SOLN PLACE 2 SPRAYS INTO  BOTH NOSTRILS 2 (TWO) TIMES DAILY. USE IN EACH NOSTRIL AS DIRECTED (Patient not taking: Reported on 05/23/2024)   Multiple Vitamin (MULTIVITAMIN ADULT PO) Take 1 tablet by mouth daily. (Patient not taking: Reported on 05/23/2024)   No facility-administered encounter medications on file as of 05/26/2024.    Allergies (verified) Augmentin  [amoxicillin -pot clavulanate], Bupropion, and Codeine   History: Past Medical History:  Diagnosis Date   Anxiety     Broken ankle    Chronic left-sided lumbar radiculopathy 01/05/2022   Contraceptive management 11/21/2012   Depression    Dizziness - light-headed/fainting 02/20/2011   Dysphagia 10/23/2012   due to thyroid  issues   Dysphagia 03/01/2011   Elevated LDL cholesterol level 05/22/2022   Fatigue/Loss of Sleep 02/20/2011   FHx: allergies 02/20/2011   Ask patient to clarify. Only had allergies noted. Need more detail per medical history form dated 01/18/11.   Generalized headaches 02/20/2011   GERD (gastroesophageal reflux disease)    History of tonsillectomy    Hoarseness/sore throat 02/20/2011   Hypertension    Miscarriage    One - date not on medical form.   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/23/2012   multinodular- has been followed x 59yrs   Wears glasses/contacts 02/20/2011   Past Surgical History:  Procedure Laterality Date   ABDOMINAL HYSTERECTOMY     ANTERIOR AND POSTERIOR REPAIR N/A 02/21/2017   Procedure: ANTERIOR (CYSTOCELE) AND POSTERIOR REPAIR (RECTOCELE);  Surgeon: Horacio Boas, MD;  Location: [facility];  Service: Gynecology;  Laterality: N/A;   BILATERAL SALPINGECTOMY N/A 02/21/2017   Procedure: BILATERAL SALPINGECTOMY;  Surgeon: Horacio Boas, MD;  Location: [facility];  Service: Gynecology;  Laterality: N/A;   BLADDER SUSPENSION N/A 02/21/2017   Procedure: Solyx Sling;  Surgeon: Horacio Boas, MD;  Location: [facility];  Service: Gynecology;  Laterality: N/A;   broken ankle Right    Pins/rod inserted-retained   COLONOSCOPY     DILATION AND CURETTAGE OF UTERUS     MAB   FRACTURE SURGERY      ORIF ANKLE FRACTURE Right 12/19/2018   Procedure: Removal of hardware and open reduction internal fixation of right ankle bimalleolar fracture;  Surgeon: Kit Rush, MD;  Location: [facility];  Service: Orthopedics;  Laterality: Right;  ok to open room per [facility] at [facility]   SEPTOPLASTY  03/31/2024   THYROIDECTOMY N/A 10/29/2012   Procedure: THYROIDECTOMY;  Surgeon: Boas CHRISTELLA Spinner, MD;  Location: [facility];  Service: General;  Laterality: N/A;  Total Thyroidectomy   TONSILLECTOMY     VAGINAL HYSTERECTOMY Bilateral 02/21/2017   Procedure: HYSTERECTOMY VAGINAL;  Surgeon: Horacio Boas, MD;  Location: [facility];  Service: Gynecology;  Laterality: Bilateral;   WISDOM TOOTH EXTRACTION     Family History  Problem Relation Age of Onset   Arthritis Mother    Cancer Mother    Heart disease Mother    Hypertension Mother    Kidney disease Mother    Obesity Mother    Heart disease Father    Alcohol  abuse Father    Vision loss Father  Colon polyps Maternal Grandmother    Social History   Socioeconomic History   Marital status: Divorced    Spouse name: Not on file   Number of children: 5   Years of education: Not on file   Highest education level: Some college, no degree  Occupational History   Not on file  Tobacco Use   Smoking status: Never   Smokeless tobacco: Never  Vaping Use   Vaping status: Never Used  Substance and Sexual Activity   Alcohol  use: Not Currently    Comment: rare -social   Drug use: No   Sexual activity: Not Currently    Birth control/protection: Abstinence, Condom, Post-menopausal  Other Topics Concern   Not on file  Social History Narrative   Not on file   Social Drivers of Health   Financial Resource Strain: Low Risk  (05/25/2024)   Overall Financial Resource Strain (CARDIA)    Difficulty of Paying Living Expenses: Not hard at all  Food Insecurity: No Food Insecurity (05/25/2024)   Hunger Vital Sign    Worried About Running Out  of Food in the Last Year: Never true    Ran Out of Food in the Last Year: Never true  Transportation Needs: No Transportation Needs (05/25/2024)   PRAPARE - Administrator, Civil Service (Medical): No    Lack of Transportation (Non-Medical): No  Physical Activity: Insufficiently Active (05/25/2024)   Exercise Vital Sign    Days of Exercise per Week: 3 days    Minutes of Exercise per Session: 30 min  Stress: No Stress Concern Present (05/25/2024)   [employer] of Occupational Health - Occupational Stress Questionnaire    Feeling of Stress: Not at all  Social Connections: Moderately Integrated (05/25/2024)   Social Connection and Isolation Panel    Frequency of Communication with Friends and Family: More than three times a week    Frequency of Social Gatherings with Friends and Family: More than three times a week    Attends Religious Services: More than 4 times per year    Active Member of [employer] or Organizations: Yes    Attends Engineer, structural: More than 4 times per year    Marital Status: Divorced    Tobacco Counseling Counseling given: Not Answered    Clinical Intake:  Pre-visit preparation completed: Yes  Pain : No/denies pain     BMI - recorded: 34.45 Nutritional Status: BMI > 30  Obese Nutritional Risks: None Diabetes: No  Lab Results  Component Value Date   HGBA1C 5.8 (A) 04/15/2024     How often do you need to have someone help you when you read instructions, pamphlets, or other written materials from your doctor or pharmacy?: 1 - Never  Interpreter Needed?: No  Information entered by :: Ellouise Haws, LPN   Activities of Daily Living     05/25/2024    6:44 PM 05/20/2024   11:00 AM  In your present state of health, do you have any difficulty performing the following activities:  Hearing? 0 0  Vision? 1 1  Comment visual decrease   Difficulty concentrating or making decisions? 0 0  Walking or climbing stairs? 0   Dressing  or bathing? 0   Doing errands, shopping? 0 0  Preparing Food and eating ? N   Using the Toilet? N   In the past six months, have you accidently leaked urine? N   Do you have problems with loss of bowel control? N  Managing your Medications? N   Managing your Finances? N   Housekeeping or managing your Housekeeping? N     Patient Care Team: Lucius Krabbe, NP as PCP - General (Family Medicine)  I have updated your Care Teams any recent Medical Services you may have received from other providers in the past year.     Assessment:   This is a routine wellness examination for Alyssa Fisher.  Hearing/Vision screen Hearing Screening - Comments:: Pt denies any hearing issues  Vision Screening - Comments:: Wears rx glasses - up to date with routine eye exams with [facility]    Goals Addressed             This Visit's Progress    Patient Stated       Get back to the gym        Depression Screen     05/26/2024    1:52 PM 05/21/2023   11:45 AM 05/19/2022   10:10 AM 01/05/2022   10:38 AM  PHQ 2/9 Scores  PHQ - 2 Score 0 0 1 0    Fall Risk     05/25/2024    6:44 PM 05/21/2023    8:30 AM 05/19/2022   10:13 AM  Fall Risk   Falls in the past year? 0 0 1  Number falls in past yr:  0 1  Injury with Fall?  0 1  Comment   foot sprain  Risk for fall due to : Impaired vision;Impaired balance/gait;Impaired mobility No Fall Risks No Fall Risks;Impaired vision;Impaired mobility  Risk for fall due to: Comment   related to sight concerns  Follow up Falls prevention discussed Falls prevention discussed Falls prevention discussed      Data saved with a previous flowsheet row definition    MEDICARE RISK AT HOME:  Medicare Risk at Home Any stairs in or around the home?: (Patient-Rptd) Yes If so, are there any without handrails?: (Patient-Rptd) No Home free of loose throw rugs in walkways, pet beds, electrical cords, etc?: (Patient-Rptd) Yes Adequate lighting in your  home to reduce risk of falls?: (Patient-Rptd) Yes Life alert?: (Patient-Rptd) No Use of a cane, walker or w/c?: (Patient-Rptd) Yes Grab bars in the bathroom?: (Patient-Rptd) No Shower chair or bench in shower?: (Patient-Rptd) No Elevated toilet seat or a handicapped toilet?: (Patient-Rptd) No  TIMED UP AND GO:  Was the test performed?  No  Cognitive Function: 6CIT completed        05/26/2024    1:55 PM 05/21/2023   10:32 AM 05/19/2022   10:16 AM  6CIT Screen  What Year? 0 points 0 points 0 points  What month? 0 points 0 points 0 points  What time? 0 points 0 points 0 points  Count back from 20 0 points 0 points 0 points  Months in reverse 0 points 0 points 0 points  Repeat phrase 0 points 0 points 0 points  Total Score 0 points 0 points 0 points    Immunizations Immunization History  Administered Date(s) Administered   Hepatitis A, Adult 02/22/2016, 08/24/2016   Influenza Inj Mdck Quad Pf 05/25/2016, 04/18/2018, 05/27/2020   Influenza Split 06/04/2012, 06/09/2013   Influenza, Mdck, Trivalent,PF 6+ MOS(egg free) 05/21/2023   Influenza, Seasonal, Injecte, Preservative Fre 05/05/2014   Influenza,inj,Quad PF,6+ Mos 05/02/2022   Influenza,trivalent, recombinat, inj, PF 05/16/2024   Influenza-Unspecified 05/05/2018   MMR 11/05/2023   PFIZER Comirnaty(Gray Top)Covid-19 Tri-Sucrose Vaccine 12/09/2020   PFIZER(Purple Top)SARS-COV-2 Vaccination 11/16/2019, 12/06/2019, 05/28/2020, 12/09/2020  Research officer, trade union 87yrs & up 07/04/2021, 06/07/2022   Pfizer(Comirnaty)Fall Seasonal Vaccine 12 years and older 06/07/2022, 05/21/2023, 05/16/2024   Pneumococcal Conjugate Pcv21, Polysaccharide Crm197 Conjugaf 02/01/2024   Pneumococcal Polysaccharide-23 08/24/2016   Tdap 09/04/2008, 02/22/2016, 02/01/2022   Zoster Recombinant(Shingrix) 12/16/2021, 02/17/2022    Screening Tests Health Maintenance  Topic Date Due   Mammogram  06/22/2024   Hepatitis B Vaccines 19-59  Average Risk (1 of 3 - 19+ 3-dose series) 04/15/2025 (Originally 11/03/1984)   Medicare Annual Wellness (AWV)  05/26/2025   Colonoscopy  04/28/2026   DTaP/Tdap/Td (4 - Td or Tdap) 02/02/2032   Pneumococcal Vaccine: 50+ Years  Completed   Influenza Vaccine  Completed   COVID-19 Vaccine  Completed   Hepatitis C Screening  Completed   HIV Screening  Completed   Zoster Vaccines- Shingrix  Completed   HPV VACCINES  Aged Out   Meningococcal B Vaccine  Aged Out    Health Maintenance Items Addressed: Mammogram ordered, See Nurse Notes at the end of this note  Additional Screening:  Vision Screening: Recommended annual ophthalmology exams for early detection of glaucoma and other disorders of the eye. Is the patient up to date with their annual eye exam?  Yes  Who is the provider or what is the name of the office in which the patient attends annual eye exams? [facility]   Dental Screening: Recommended annual dental exams for proper oral hygiene  Community Resource Referral / Chronic Care Management: CRR required this visit?  No   CCM required this visit?  No   Plan:    I have personally reviewed and noted the following in the patient's chart:   Medical and social history Use of alcohol , tobacco or illicit drugs  Current medications and supplements including opioid prescriptions. Patient is not currently taking opioid prescriptions. Functional ability and status Nutritional status Physical activity Advanced directives List of other physicians Hospitalizations, surgeries, and ER visits in previous 12 months Vitals Screenings to include cognitive, depression, and falls Referrals and appointments  In addition, I have reviewed and discussed with patient certain preventive protocols, quality metrics, and best practice recommendations. A written personalized care plan for preventive services as well as general preventive health recommendations were provided  to patient.   Ellouise VEAR Haws, LPN   [id number]   After Visit Summary: (MyChart) Due to this being a telephonic visit, the after visit summary with patients personalized plan was offered to patient via MyChart   Notes: Nothing significant to report at this time.

## 2024-05-28 ENCOUNTER — Other Ambulatory Visit

## 2024-05-29 ENCOUNTER — Ambulatory Visit (INDEPENDENT_AMBULATORY_CARE_PROVIDER_SITE_OTHER): Admitting: Family

## 2024-05-29 ENCOUNTER — Encounter: Payer: Self-pay | Admitting: Family

## 2024-05-29 VITALS — BP 120/80 | HR 70 | Temp 97.7°F | Ht 65.0 in | Wt 208.5 lb

## 2024-05-29 DIAGNOSIS — Z79890 Hormone replacement therapy (postmenopausal): Secondary | ICD-10-CM

## 2024-05-29 DIAGNOSIS — I2699 Other pulmonary embolism without acute cor pulmonale: Secondary | ICD-10-CM

## 2024-05-29 DIAGNOSIS — E669 Obesity, unspecified: Secondary | ICD-10-CM

## 2024-05-29 NOTE — Assessment & Plan Note (Signed)
 Working on weight reduction. Had lost weight on Ozempic , but insurance no longer covering. Advised on dietary changes and plans to resume gym activities post-respiratory symptom improvement. - Encourage continuation of dietary modifications. - Encourage regular physical activity as tolerated. - Consider referral to a weight management program if needed.

## 2024-05-29 NOTE — Progress Notes (Signed)
 Patient ID: Alyssa Fisher, female    DOB: April 28, 1966, 58 y.o.   MRN: [id number]  Chief Complaint  Patient presents with   Hospitalization Follow-up    Pt was seen in ED on 05/19/2024 for Acute pulmonary embolism.  Discussed the use of AI scribe software for clinical note transcription with the patient, who gave verbal consent to proceed.  History of Present Illness   Alyssa Fisher is a 58 year old female who presents for follow up after a hospitalization from 9/15-18.  She experiences leg swelling and a persistent cough following septoplasty and initiation of hormone replacement therapy.  She reports having a knot in her calf and a persistent dry cough that impeded conversation and with difficulty breathing which prompted her to go to the ER.  Her leg was also swollen and hard, resembling a sunburn but not hot to the touch. Initially suspecting a blood clot, she delayed seeking care but was later admitted to the hospital for four days and started on heparin . Her leg swelling has decreased but persists, and she is currently on Eliquis  for 6 months.  She is disappointed her insurance will no longer cover her Ozempic  and she is trying to manage her weight through diet and plans to return to the gym once symptoms improve. She uses Lexapro , recently refilled, and anticipates a follow-up appointment in February.     Assessment and Plan    Acute left lower extremity deep vein thrombosis and pulmonary embolism DVT and PE possibly due to combination of recent weight gain, HRT, recent surgery, and immobility. Hospitalized and started on anticoagulation. Lower leg edema, Cough and breathing have improved but not completely resolved.  Declined lifelong anticoagulation if HRT resumed. Currently on blood thinner with symptom improvement. - Continue anticoagulation therapy as prescribed. - Elevate the left leg when resting. - Follow up with Hematology in November. - Discuss non hormonal options with GYN for  postmenopausal symptoms. - Call the office with any worsening of symptoms.  Obesity Working on weight reduction. Advised on dietary changes and plans to resume gym activities post-respiratory symptom improvement. Had lost weight on Ozempic , but insurance no longer covering. - Encourage continuation of dietary modifications. - Encourage regular physical activity as tolerated. - Consider referral to a weight management program if needed.      Subjective:    Outpatient Medications Prior to Visit  Medication Sig Dispense Refill   ALPRAZolam  (XANAX ) 0.25 MG tablet Take 1 tablet (0.25 mg total) by mouth daily as needed for anxiety.     APIXABAN  (ELIQUIS ) VTE STARTER PACK (10MG  AND 5MG ) Take as directed on package: start with two-5mg  tablets twice daily for 7 days. On day 8, switch to one-5mg  tablet twice daily. 74 each 0   cetirizine (ZYRTEC) 10 MG tablet Take 10 mg by mouth daily.     diltiazem  (DILT-XR) 120 MG 24 hr capsule Take 1 capsule (120 mg total) by mouth daily. 90 capsule 1   diphenhydrAMINE -zinc  acetate (BENADRYL ) cream Apply topically 3 (three) times daily as needed for itching. 28.4 g 0   escitalopram  (LEXAPRO ) 10 MG tablet Take 1 tablet (10 mg total) by mouth daily. 90 tablet 1   fluticasone  (FLONASE ) 50 MCG/ACT nasal spray SPRAY 2 SPRAYS INTO EACH NOSTRIL EVERY DAY 48 mL 2   levothyroxine  (SYNTHROID ) 112 MCG tablet Take 112 mcg by mouth daily before breakfast.     omeprazole  (PRILOSEC) 20 MG capsule Take 1 capsule (20 mg total) by mouth daily. 90 capsule  1   Azelastine  HCl 137 MCG/SPRAY SOLN PLACE 2 SPRAYS INTO BOTH NOSTRILS 2 (TWO) TIMES DAILY. USE IN EACH NOSTRIL AS DIRECTED (Patient not taking: Reported on 05/23/2024) 15 mL 7   Multiple Vitamin (MULTIVITAMIN ADULT PO) Take 1 tablet by mouth daily. (Patient not taking: Reported on 05/23/2024)     No facility-administered medications prior to visit.   Past Medical History:  Diagnosis Date   Anxiety    Broken ankle    Chronic  left-sided lumbar radiculopathy 01/05/2022   Contraceptive management 11/21/2012   Depression    Dizziness - light-headed/fainting 02/20/2011   Dysphagia 10/23/2012   due to thyroid  issues   Dysphagia 03/01/2011   Elevated LDL cholesterol level 05/22/2022   Fatigue/Loss of Sleep 02/20/2011   FHx: allergies 02/20/2011   Ask patient to clarify. Only had allergies noted. Need more detail per medical history form dated 01/18/11.   Generalized headaches 02/20/2011   GERD (gastroesophageal reflux disease)    History of tonsillectomy    Hoarseness/sore throat 02/20/2011   Hypertension    Miscarriage    One - date not on medical form.   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/23/2012   multinodular- has been followed x 37yrs   Wears glasses/contacts 02/20/2011   Past Surgical History:  Procedure Laterality Date   ABDOMINAL HYSTERECTOMY     ANTERIOR AND POSTERIOR REPAIR N/A 02/21/2017   Procedure: ANTERIOR (CYSTOCELE) AND POSTERIOR REPAIR (RECTOCELE);  Surgeon: Horacio Boas, MD;  Location: [facility];  Service: Gynecology;  Laterality: N/A;   BILATERAL SALPINGECTOMY N/A 02/21/2017   Procedure: BILATERAL SALPINGECTOMY;  Surgeon: Horacio Boas, MD;  Location: [facility];  Service: Gynecology;  Laterality: N/A;   BLADDER SUSPENSION N/A 02/21/2017   Procedure: Solyx Sling;  Surgeon: Horacio Boas, MD;  Location: [facility];  Service: Gynecology;  Laterality: N/A;   broken ankle Right    Pins/rod inserted-retained   COLONOSCOPY     DILATION AND CURETTAGE OF UTERUS     MAB   FRACTURE SURGERY     ORIF ANKLE FRACTURE  Right 12/19/2018   Procedure: Removal of hardware and open reduction internal fixation of right ankle bimalleolar fracture;  Surgeon: Kit Rush, MD;  Location: [facility];  Service: Orthopedics;  Laterality: Right;  ok to open room per Tam[facility] at [facility]   SEPTOPLASTY  03/31/2024   THYROIDECTOMY N/A 10/29/2012   Procedure: THYROIDECTOMY;  Surgeon: Boas CHRISTELLA Spinner, MD;  Location: [facility];  Service: General;  Laterality: N/A;  Total Thyroidectomy   TONSILLECTOMY     VAGINAL HYSTERECTOMY Bilateral 02/21/2017   Procedure: HYSTERECTOMY VAGINAL;  Surgeon: Horacio Boas, MD;  Location: [facility];  Service: Gynecology;  Laterality: Bilateral;   WISDOM TOOTH EXTRACTION     Allergies  Allergen Reactions   Augmentin  [Amoxicillin -Pot Clavulanate] Diarrhea   Bupropion Other (See Comments)    Speech sx- stuttering   Codeine     Patient stated possible due to mother and grandparents being allergic as well. Per medical history form dated 01/18/11. Prefers not to have.      Objective:  Physical Exam Vitals and nursing note reviewed.  Constitutional:      Appearance: Normal appearance. She is obese.  Cardiovascular:     Rate and Rhythm: Normal rate and regular rhythm.  Pulmonary:     Effort: Pulmonary effort is normal.     Breath sounds: Normal breath sounds.  Musculoskeletal:        General: Normal range of motion.     Left lower leg: 2+ Edema (no erythema noted) present.  Skin:    General: Skin is warm and dry.  Neurological:     Mental Status: She is alert.  Psychiatric:        Mood and Affect: Mood normal.        Behavior: Behavior normal.    BP 120/80 (BP Location: Left Arm, Patient Position: Sitting, Cuff Size: Large)   Pulse 70   Temp 97.7 F (36.5 C) (Temporal)   Ht 5' 5 (1.651 m)   Wt 208 lb 8 oz (94.6 kg)   LMP 10/12/2012   SpO2 98%   BMI 34.70 kg/m  Wt Readings from Last 3 Encounters:  05/29/24 208 lb 8 oz (94.6 kg)  05/26/24 207 lb (93.9 kg)   05/20/24 207 lb 4.8 oz (94 kg)      Lucius Krabbe, NP

## 2024-06-04 ENCOUNTER — Ambulatory Visit
Admission: RE | Admit: 2024-06-04 | Discharge: 2024-06-04 | Disposition: A | Discharge status: Home / Self Care | Source: Ambulatory Visit | Attending: Family | Admitting: Family

## 2024-06-04 ENCOUNTER — Ambulatory Visit: Admitting: "Endocrinology

## 2024-06-04 DIAGNOSIS — Z1231 Encounter for screening mammogram for malignant neoplasm of breast: Secondary | ICD-10-CM

## 2024-06-09 ENCOUNTER — Other Ambulatory Visit: Payer: Self-pay | Admitting: Physician Assistant

## 2024-06-09 DIAGNOSIS — F419 Anxiety disorder, unspecified: Secondary | ICD-10-CM

## 2024-06-10 ENCOUNTER — Other Ambulatory Visit: Payer: Self-pay | Admitting: Family

## 2024-06-10 DIAGNOSIS — R928 Other abnormal and inconclusive findings on diagnostic imaging of breast: Secondary | ICD-10-CM

## 2024-06-16 ENCOUNTER — Encounter: Payer: Self-pay | Admitting: Family

## 2024-06-16 DIAGNOSIS — I2699 Other pulmonary embolism without acute cor pulmonale: Secondary | ICD-10-CM

## 2024-06-17 MED ORDER — APIXABAN 5 MG PO TABS: 5.0000 mg | ORAL_TABLET | Freq: Two times a day (BID) | ORAL | 5 refills | Status: AC

## 2024-06-25 ENCOUNTER — Ambulatory Visit
Admission: RE | Admit: 2024-06-25 | Discharge: 2024-06-25 | Disposition: A | Source: Ambulatory Visit | Attending: Family | Admitting: Family

## 2024-06-25 DIAGNOSIS — R928 Other abnormal and inconclusive findings on diagnostic imaging of breast: Secondary | ICD-10-CM | POA: Diagnosis not present

## 2024-06-25 DIAGNOSIS — N6001 Solitary cyst of right breast: Secondary | ICD-10-CM | POA: Diagnosis not present

## 2024-06-26 ENCOUNTER — Other Ambulatory Visit

## 2024-06-26 ENCOUNTER — Other Ambulatory Visit: Payer: Self-pay | Admitting: Family

## 2024-06-26 DIAGNOSIS — N6310 Unspecified lump in the right breast, unspecified quadrant: Secondary | ICD-10-CM

## 2024-07-01 ENCOUNTER — Ambulatory Visit: Admitting: "Endocrinology

## 2024-07-01 ENCOUNTER — Ambulatory Visit
Admission: RE | Admit: 2024-07-01 | Discharge: 2024-07-01 | Disposition: A | Discharge status: Home / Self Care | Source: Ambulatory Visit | Attending: Family | Admitting: Family

## 2024-07-01 DIAGNOSIS — N6310 Unspecified lump in the right breast, unspecified quadrant: Secondary | ICD-10-CM

## 2024-07-01 DIAGNOSIS — N6001 Solitary cyst of right breast: Secondary | ICD-10-CM | POA: Diagnosis not present

## 2024-07-07 ENCOUNTER — Encounter: Payer: Self-pay | Admitting: Radiology

## 2024-07-28 ENCOUNTER — Inpatient Hospital Stay

## 2024-07-28 ENCOUNTER — Inpatient Hospital Stay: Admitting: Oncology

## 2024-07-29 ENCOUNTER — Encounter: Payer: Self-pay | Admitting: Family

## 2024-07-30 ENCOUNTER — Other Ambulatory Visit: Payer: Self-pay | Admitting: Family

## 2024-07-30 DIAGNOSIS — F419 Anxiety disorder, unspecified: Secondary | ICD-10-CM

## 2024-07-30 MED ORDER — ESCITALOPRAM OXALATE 10 MG PO TABS: 15.0000 mg | ORAL_TABLET | Freq: Every day | ORAL | 1 refills | Status: AC

## 2024-07-30 MED ORDER — ALPRAZOLAM 0.25 MG PO TABS: 0.2500 mg | ORAL_TABLET | Freq: Every day | ORAL | 1 refills | Status: AC | PRN

## 2024-09-11 ENCOUNTER — Other Ambulatory Visit: Payer: Self-pay | Admitting: "Endocrinology

## 2024-09-11 NOTE — Telephone Encounter (Signed)
"  Requested Prescriptions     Pending Prescriptions Disp Refills    levothyroxine  (SYNTHROID ) 125 MCG tablet [Pharmacy Med Name: LEVOTHYROXINE  125 MCG TABLET] 90 tablet 1     Sig: TAKE 1 TABLET BY MOUTH EVERY DAY      "

## 2024-10-09 ENCOUNTER — Ambulatory Visit: Payer: Self-pay

## 2024-10-09 NOTE — Telephone Encounter (Signed)
 Noted.

## 2024-10-09 NOTE — Telephone Encounter (Signed)
 FYI Only or Action Required?: FYI only for provider: appointment scheduled on tomorrow morning.  Patient was last seen in primary care on 05/29/2024 by Lucius Krabbe, NP.  Called Nurse Triage reporting Cough. Chest pain from cough. Discolored phlegm  Symptoms began a week ago.  Interventions attempted: OTC medications: nasal spray, tylenol  and cough medication.  Symptoms are: gradually worsening.  Triage Disposition: See Physician Within 24 Hours  Patient/caregiver understands and will follow disposition?: Yes                              Reason for Triage: Headache, cough and congestion. Chest pain from coughing. SOB. Discolored phlegm  Reason for Disposition  [1] Continuous (nonstop) coughing interferes with work or school AND [2] no improvement using cough treatment per Care Advice  Answer Assessment - Initial Assessment Questions 1. ONSET: When did the cough begin?      1 week 2. SEVERITY: How bad is the cough today?      moderate 3. SPUTUM: Describe the color of your sputum (e.g., none, dry cough; clear, white, yellow, green)     Yellow green 4. HEMOPTYSIS: Are you coughing up any blood? If Yes, ask: How much? (e.g., flecks, streaks, tablespoons, etc.)     no 5. DIFFICULTY BREATHING: Are you having difficulty breathing? If Yes, ask: How bad is it? (e.g., mild, moderate, severe)      mild 6. FEVER: Do you have a fever? If Yes, ask: What is your temperature, how was it measured, and when did it start?     no 7. CARDIAC HISTORY: Do you have any history of heart disease? (e.g., heart attack, congestive heart failure)      yes 8. LUNG HISTORY: Do you have any history of lung disease?  (e.g., pulmonary embolus, asthma, emphysema)     No 9. History of blood clots  10. OTHER SYMPTOMS: Do you have any other symptoms? (e.g., runny nose, wheezing, chest pain)       HA, Cough, congestion, chest pain from coughing  Protocols  used: Cough - Acute Productive-A-AH

## 2024-10-10 ENCOUNTER — Encounter: Payer: Self-pay | Admitting: Family

## 2024-10-10 ENCOUNTER — Ambulatory Visit: Admitting: Family

## 2024-10-10 VITALS — BP 128/84 | HR 65 | Temp 98.4°F | Ht 65.0 in | Wt 196.6 lb

## 2024-10-10 DIAGNOSIS — R053 Chronic cough: Secondary | ICD-10-CM

## 2024-10-10 DIAGNOSIS — E669 Obesity, unspecified: Secondary | ICD-10-CM

## 2024-10-10 LAB — POCT INFLUENZA A/B
Influenza A, POC: NEGATIVE
Influenza B, POC: NEGATIVE

## 2024-10-10 LAB — POC COVID19 BINAXNOW: SARS Coronavirus 2 Ag: NEGATIVE

## 2024-10-10 MED ORDER — AZITHROMYCIN 250 MG PO TABS: ORAL_TABLET | ORAL | 0 refills | Status: AC

## 2024-10-10 MED ORDER — OZEMPIC (0.25 OR 0.5 MG/DOSE) 2 MG/3ML ~~LOC~~ SOPN: 0.2500 mg | PEN_INJECTOR | SUBCUTANEOUS | 1 refills | Status: AC

## 2024-10-10 MED ORDER — METHYLPREDNISOLONE ACETATE 80 MG/ML IJ SUSP
80.0000 mg | Freq: Once | INTRAMUSCULAR | Status: AC
  Administered 2024-10-10: 80 mg via INTRAMUSCULAR

## 2024-10-10 NOTE — Progress Notes (Signed)
 "  Patient ID: Alyssa Fisher, female    DOB: [birth year], 59 y.o.   MRN: [id number]  Chief Complaint  Patient presents with   Cough    Pt c/o cough,sore throat, headache, nasal/chest congestion,sx present for 1 week. Has tried guaifenesin and tylenol , which did not help.  HPI: Pt c/o cough, sore throat, headache, nasal/chest congestion,sx present for over 1 week. Has tried Guaifenesin and tylenol , which did not help. She reports her son was sick prior to her having symptoms. She denies fever. She recently lost her mom after dx with cancer to which she quickly succumbed. She has felt run down & tired for a long time and her weight is down 12lbs. She states she had actually lost more but has regained a few lbs back. She is asking about trying to get back on Ozempic  injections.  ASSESSMENT & PLAN:  1. Persistent cough (Primary) & sinusitis Rapid testing negative. Given DepoMedrol injection to help symptoms, lung inflammation. Also sending zpack to help sinusitis sx due to duration & severity, allergy to augmentin . Advised to increase Flonase  to bid for a few days. Call back if sx are still persisting after finishing abt.  - POC COVID-19 - POCT Influenza A/B - methylPREDNISolone  acetate (DEPO-MEDROL ) injection 80 mg - azithromycin  (ZITHROMAX ) 250 MG tablet; Take 2 tablets on day 1, then 1 tablet daily on days 2 through 5  Dispense: 6 tablet; Refill: 0  2. Obesity (BMI 30-39.9) Sending Ozempic  0.25mg  weekly. Reports same insurance covered in 2024 then denied it last year, and wants to see if covered this year. Recently lost about 12 llbs, but believes due to stress and no appetite. Advised to look online for any cash pay options for Ozempic  if insurance does not cover. F/U in 1-2 mos after starting medication.   - Semaglutide ,0.25 or 0.5MG /DOS, (OZEMPIC , 0.25 OR 0.5 MG/DOSE,) 2 MG/3ML SOPN; Inject 0.25 mg into the skin once a week. Increase to 0.5mg  after 1-2 weeks if tolerating.  Dispense: 3 mL;  Refill: 1   Subjective:    Outpatient Medications Prior to Visit  Medication Sig Dispense Refill   ALPRAZolam  (XANAX ) 0.25 MG tablet Take 1 tablet (0.25 mg total) by mouth daily as needed for anxiety. 30 tablet 1   apixaban  (ELIQUIS ) 5 MG TABS tablet Take 1 tablet (5 mg total) by mouth 2 (two) times daily. 60 tablet 5   Azelastine  HCl 137 MCG/SPRAY SOLN PLACE 2 SPRAYS INTO BOTH NOSTRILS 2 (TWO) TIMES DAILY. USE IN EACH NOSTRIL AS DIRECTED 15 mL 7   cetirizine (ZYRTEC) 10 MG tablet Take 10 mg by mouth daily.     diltiazem  (DILT-XR) 120 MG 24 hr capsule Take 1 capsule (120 mg total) by mouth daily. 90 capsule 1   diphenhydrAMINE -zinc  acetate (BENADRYL ) cream Apply topically 3 (three) times daily as needed for itching. 28.4 g 0   escitalopram  (LEXAPRO ) 10 MG tablet Take 1.5 tablets (15 mg total) by mouth daily. 135 tablet 1   fluticasone  (FLONASE ) 50 MCG/ACT nasal spray SPRAY 2 SPRAYS INTO EACH NOSTRIL EVERY DAY 48 mL 2   levothyroxine  (SYNTHROID ) 112 MCG tablet Take 112 mcg by mouth daily before breakfast.     levothyroxine  (SYNTHROID ) 125 MCG tablet TAKE 1 TABLET BY MOUTH EVERY DAY 90 tablet 1   omeprazole  (PRILOSEC) 20 MG capsule Take 1 capsule (20 mg total) by mouth daily. 90 capsule 1   No facility-administered medications prior to visit.   Past Medical History:  Diagnosis Date   Anxiety  Broken ankle    Chronic left-sided lumbar radiculopathy 01/05/2022   Contraceptive management 11/21/2012   Depression    Dizziness - light-headed/fainting 02/20/2011   Dysphagia 10/23/2012   due to thyroid  issues   Dysphagia 03/01/2011   Elevated LDL cholesterol level 05/22/2022   Fatigue/Loss of Sleep 02/20/2011   FHx: allergies 02/20/2011   Ask patient to clarify. Only had allergies noted. Need more detail per medical history form dated 01/18/11.   Generalized headaches 02/20/2011   GERD (gastroesophageal reflux disease)    History of tonsillectomy    Hoarseness/sore throat 02/20/2011    Hypertension    Miscarriage    One - date not on medical form.   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/23/2012   multinodular- has been followed x 75yrs   Wears glasses/contacts 02/20/2011   Past Surgical History:  Procedure Laterality Date   ABDOMINAL HYSTERECTOMY     ANTERIOR AND POSTERIOR REPAIR N/A 02/21/2017   Procedure: ANTERIOR (CYSTOCELE) AND POSTERIOR REPAIR (RECTOCELE);  Surgeon: Horacio Boas, MD;  Location: [facility];  Service: Gynecology;  Laterality: N/A;   BILATERAL SALPINGECTOMY N/A 02/21/2017   Procedure: BILATERAL SALPINGECTOMY;  Surgeon: Horacio Boas, MD;  Location: [facility];  Service: Gynecology;  Laterality: N/A;   BLADDER SUSPENSION N/A 02/21/2017   Procedure: Solyx Sling;  Surgeon: Horacio Boas, MD;  Location: [facility];  Service: Gynecology;  Laterality: N/A;   broken ankle Right    Pins/rod inserted-retained   COLONOSCOPY     DILATION AND CURETTAGE OF UTERUS     MAB   FRACTURE SURGERY     ORIF ANKLE FRACTURE Right 12/19/2018   Procedure: Removal of hardware and open reduction internal fixation of right ankle bimalleolar fracture;  Surgeon: Kit Rush, MD;  Location: [facility];  Service: Orthopedics;  Laterality: Right;  ok to open room per Tammy at [facility]   SEPTOPLASTY  03/31/2024   THYROIDECTOMY N/A 10/29/2012   Procedure: THYROIDECTOMY;  Surgeon: Boas CHRISTELLA Spinner, MD;  Location: [facility];  Service: General;  Laterality: N/A;  Total Thyroidectomy   TONSILLECTOMY     VAGINAL HYSTERECTOMY Bilateral 02/21/2017   Procedure: HYSTERECTOMY VAGINAL;  Surgeon:  Horacio Boas, MD;  Location: [facility];  Service: Gynecology;  Laterality: Bilateral;   WISDOM TOOTH EXTRACTION     Allergies[1]    Objective:    Physical Exam Vitals and nursing note reviewed.  Constitutional:      Appearance: Normal appearance. She is ill-appearing.     Interventions: Face mask in place.  HENT:     Right Ear: Tympanic membrane and ear canal normal.     Left Ear: Tympanic membrane and ear canal normal.     Nose:     Right Sinus: Frontal sinus tenderness present.     Left Sinus: Frontal sinus tenderness present.     Mouth/Throat:     Mouth: Mucous membranes are moist.     Pharynx: Posterior oropharyngeal erythema present. No pharyngeal swelling, oropharyngeal exudate or uvula swelling.     Tonsils: No tonsillar exudate or tonsillar abscesses.  Cardiovascular:  Rate and Rhythm: Normal rate and regular rhythm.  Pulmonary:     Effort: Pulmonary effort is normal.     Breath sounds: Normal breath sounds.  Musculoskeletal:        General: Normal range of motion.  Lymphadenopathy:     Head:     Right side of head: No preauricular or posterior auricular adenopathy.     Left side of head: No preauricular or posterior auricular adenopathy.     Cervical: No cervical adenopathy.  Skin:    General: Skin is warm and dry.  Neurological:     Mental Status: She is alert.  Psychiatric:        Mood and Affect: Mood normal.        Behavior: Behavior normal.    BP 128/84 (BP Location: Left Arm, Patient Position: Sitting, Cuff Size: Normal)   Pulse 65   Temp 98.4 F (36.9 C) (Temporal)   Ht 5' 5 (1.651 m)   Wt 196 lb 9.6 oz (89.2 kg)   LMP 10/12/2012   SpO2 95%   BMI 32.72 kg/m  Wt Readings from Last 3 Encounters:  10/10/24 196 lb 9.6 oz (89.2 kg)  05/29/24 208 lb 8 oz (94.6 kg)  05/26/24 207 lb (93.9 kg)      Sindy Mccune, NP      [1]  Allergies Allergen Reactions   Augmentin  [Amoxicillin -Pot Clavulanate] Diarrhea   Bupropion Other (See  Comments)    Speech sx- stuttering   Codeine     Patient stated possible due to mother and grandparents being allergic as well. Per medical history form dated 01/18/11. Prefers not to have.   "

## 2024-10-16 ENCOUNTER — Ambulatory Visit: Admitting: Family

## 2025-06-01 ENCOUNTER — Ambulatory Visit
